# Patient Record
Sex: Male | Born: 1950 | Race: Black or African American | Hispanic: Refuse to answer | Marital: Married | Smoking: Former smoker
Health system: Southern US, Community
[De-identification: ages and names within clinical notes are randomized; demographics above are authoritative.]

## PROBLEM LIST (undated history)

## (undated) DIAGNOSIS — R7302 Impaired glucose tolerance (oral): Secondary | ICD-10-CM

## (undated) DIAGNOSIS — E785 Hyperlipidemia, unspecified: Secondary | ICD-10-CM

## (undated) DIAGNOSIS — I491 Atrial premature depolarization: Secondary | ICD-10-CM

## (undated) DIAGNOSIS — I10 Essential (primary) hypertension: Secondary | ICD-10-CM

## (undated) DIAGNOSIS — K649 Unspecified hemorrhoids: Secondary | ICD-10-CM

## (undated) DIAGNOSIS — F0390 Unspecified dementia without behavioral disturbance: Secondary | ICD-10-CM

## (undated) DIAGNOSIS — M1990 Unspecified osteoarthritis, unspecified site: Secondary | ICD-10-CM

## (undated) DIAGNOSIS — M109 Gout, unspecified: Secondary | ICD-10-CM

## (undated) HISTORY — DX: Essential (primary) hypertension: I10

## (undated) HISTORY — DX: Unspecified dementia, unspecified severity, without behavioral disturbance, psychotic disturbance, mood disturbance, and anxiety: F03.90

## (undated) HISTORY — PX: KNEE ARTHROSCOPY: SUR90

## (undated) HISTORY — DX: Atrial premature depolarization: I49.1

## (undated) HISTORY — DX: Hyperlipidemia, unspecified: E78.5

## (undated) HISTORY — DX: Impaired glucose tolerance (oral): R73.02

## (undated) HISTORY — DX: Gout, unspecified: M10.9

## (undated) HISTORY — DX: Unspecified hemorrhoids: K64.9

## (undated) HISTORY — DX: Unspecified osteoarthritis, unspecified site: M19.90

---

## 2003-08-28 ENCOUNTER — Ambulatory Visit (HOSPITAL_COMMUNITY): Admission: RE | Admit: 2003-08-28 | Discharge: 2003-08-28 | Payer: Self-pay | Admitting: Family Medicine

## 2003-08-29 ENCOUNTER — Ambulatory Visit (HOSPITAL_COMMUNITY): Admission: RE | Admit: 2003-08-29 | Discharge: 2003-08-29 | Payer: Self-pay | Admitting: Cardiology

## 2004-07-15 HISTORY — PX: COLONOSCOPY: SHX174

## 2004-08-09 ENCOUNTER — Ambulatory Visit (HOSPITAL_COMMUNITY): Admission: RE | Admit: 2004-08-09 | Discharge: 2004-08-09 | Payer: Self-pay | Admitting: Internal Medicine

## 2004-08-09 LAB — HM COLONOSCOPY

## 2005-01-29 ENCOUNTER — Ambulatory Visit: Payer: Self-pay | Admitting: Family Medicine

## 2005-03-05 ENCOUNTER — Ambulatory Visit: Payer: Self-pay | Admitting: Family Medicine

## 2005-04-09 ENCOUNTER — Ambulatory Visit: Payer: Self-pay | Admitting: Family Medicine

## 2005-06-03 ENCOUNTER — Ambulatory Visit: Payer: Self-pay | Admitting: Family Medicine

## 2005-06-11 ENCOUNTER — Ambulatory Visit: Payer: Self-pay | Admitting: *Deleted

## 2005-06-23 ENCOUNTER — Ambulatory Visit (HOSPITAL_COMMUNITY): Admission: RE | Admit: 2005-06-23 | Discharge: 2005-06-23 | Payer: Self-pay | Admitting: *Deleted

## 2005-06-25 ENCOUNTER — Ambulatory Visit: Payer: Self-pay | Admitting: *Deleted

## 2005-07-25 ENCOUNTER — Ambulatory Visit: Payer: Self-pay | Admitting: *Deleted

## 2005-08-07 ENCOUNTER — Ambulatory Visit: Payer: Self-pay | Admitting: *Deleted

## 2005-08-21 ENCOUNTER — Ambulatory Visit: Payer: Self-pay | Admitting: *Deleted

## 2005-10-13 ENCOUNTER — Ambulatory Visit: Payer: Self-pay | Admitting: Family Medicine

## 2005-10-14 ENCOUNTER — Ambulatory Visit (HOSPITAL_COMMUNITY): Admission: RE | Admit: 2005-10-14 | Discharge: 2005-10-14 | Payer: Self-pay | Admitting: Family Medicine

## 2005-10-29 ENCOUNTER — Ambulatory Visit: Payer: Self-pay | Admitting: Family Medicine

## 2005-12-22 ENCOUNTER — Ambulatory Visit: Payer: Self-pay | Admitting: Family Medicine

## 2006-04-28 ENCOUNTER — Ambulatory Visit: Payer: Self-pay | Admitting: Family Medicine

## 2006-05-01 ENCOUNTER — Ambulatory Visit: Payer: Self-pay | Admitting: *Deleted

## 2006-06-23 ENCOUNTER — Ambulatory Visit: Payer: Self-pay | Admitting: Family Medicine

## 2006-10-23 ENCOUNTER — Ambulatory Visit: Payer: Self-pay | Admitting: Family Medicine

## 2006-12-03 ENCOUNTER — Ambulatory Visit: Payer: Self-pay | Admitting: Family Medicine

## 2007-02-09 ENCOUNTER — Encounter: Payer: Self-pay | Admitting: Family Medicine

## 2007-02-09 LAB — CONVERTED CEMR LAB
ALT: 20 units/L (ref 0–53)
AST: 18 units/L (ref 0–37)
Albumin: 3.9 g/dL (ref 3.5–5.2)
Alkaline Phosphatase: 72 units/L (ref 39–117)
BUN: 23 mg/dL (ref 6–23)
Bilirubin, Direct: 0.1 mg/dL (ref 0.0–0.3)
CO2: 23 meq/L (ref 19–32)
Calcium: 9.1 mg/dL (ref 8.4–10.5)
Chloride: 106 meq/L (ref 96–112)
Cholesterol: 169 mg/dL (ref 0–200)
Creatinine, Ser: 1.16 mg/dL (ref 0.40–1.50)
Glucose, Bld: 87 mg/dL (ref 70–99)
HDL: 43 mg/dL (ref >39)
Indirect Bilirubin: 0.4 mg/dL (ref 0.0–0.9)
LDL Cholesterol: 112 mg/dL — ABNORMAL HIGH (ref 0–99)
Potassium: 4.5 meq/L (ref 3.5–5.3)
Sodium: 139 meq/L (ref 135–145)
Total Bilirubin: 0.5 mg/dL (ref 0.3–1.2)
Total CHOL/HDL Ratio: 3.9
Total Protein: 6.8 g/dL (ref 6.0–8.3)
Triglycerides: 72 mg/dL (ref <150)
VLDL: 14 mg/dL (ref 0–40)

## 2007-02-12 ENCOUNTER — Ambulatory Visit: Payer: Self-pay | Admitting: Family Medicine

## 2007-06-21 ENCOUNTER — Encounter: Payer: Self-pay | Admitting: Family Medicine

## 2007-06-21 LAB — CONVERTED CEMR LAB
ALT: 22 units/L (ref 0–53)
AST: 18 units/L (ref 0–37)
Albumin: 3.8 g/dL (ref 3.5–5.2)
Alkaline Phosphatase: 76 units/L (ref 39–117)
BUN: 15 mg/dL (ref 6–23)
Basophils Absolute: 0 10*3/uL (ref 0.0–0.1)
Basophils Relative: 1 % (ref 0–1)
Bilirubin, Direct: 0.1 mg/dL (ref 0.0–0.3)
Blood Glucose, Fasting: 101 mg/dL
CO2: 24 meq/L (ref 19–32)
Calcium: 9.2 mg/dL (ref 8.4–10.5)
Chloride: 106 meq/L (ref 96–112)
Cholesterol: 191 mg/dL (ref 0–200)
Creatinine, Ser: 1.08 mg/dL (ref 0.40–1.50)
Eosinophils Absolute: 0.1 10*3/uL (ref 0.0–0.7)
Eosinophils Relative: 3 % (ref 0–5)
Glucose, Bld: 101 mg/dL — ABNORMAL HIGH (ref 70–99)
HCT: 43 % (ref 39.0–52.0)
HDL: 53 mg/dL (ref >39)
Hemoglobin: 14.1 g/dL (ref 13.0–17.0)
Indirect Bilirubin: 0.2 mg/dL (ref 0.0–0.9)
LDL Cholesterol: 117 mg/dL — ABNORMAL HIGH (ref 0–99)
Lymphocytes Relative: 52 % — ABNORMAL HIGH (ref 12–46)
Lymphs Abs: 2.1 10*3/uL (ref 0.7–3.3)
MCHC: 32.8 g/dL (ref 30.0–36.0)
MCV: 88.8 fL (ref 78.0–100.0)
Monocytes Absolute: 0.4 10*3/uL (ref 0.2–0.7)
Monocytes Relative: 9 % (ref 3–11)
Neutro Abs: 1.4 10*3/uL — ABNORMAL LOW (ref 1.7–7.7)
Neutrophils Relative %: 35 % — ABNORMAL LOW (ref 43–77)
PSA: 0.59 ng/mL
PSA: 0.59 ng/mL (ref 0.10–4.00)
Platelets: 235 10*3/uL (ref 150–400)
Potassium: 4.7 meq/L (ref 3.5–5.3)
RBC count: 4.84 10*6/uL
RBC: 4.84 M/uL (ref 4.22–5.81)
RDW: 14.7 % — ABNORMAL HIGH (ref 11.5–14.0)
Sodium: 141 meq/L (ref 135–145)
Total Bilirubin: 0.3 mg/dL (ref 0.3–1.2)
Total CHOL/HDL Ratio: 3.6
Total Protein: 6.7 g/dL (ref 6.0–8.3)
Triglycerides: 104 mg/dL (ref <150)
Uric Acid, Serum: 7.5 mg/dL — ABNORMAL HIGH (ref 2.4–7.0)
VLDL: 21 mg/dL (ref 0–40)
WBC, blood: 4.1 10*3/uL
WBC: 4.1 10*3/uL (ref 4.0–10.5)

## 2007-06-25 ENCOUNTER — Ambulatory Visit: Payer: Self-pay | Admitting: Family Medicine

## 2007-10-15 ENCOUNTER — Encounter: Payer: Self-pay | Admitting: Family Medicine

## 2007-10-15 LAB — CONVERTED CEMR LAB
ALT: 21 units/L (ref 0–53)
AST: 18 units/L (ref 0–37)
Albumin: 3.7 g/dL (ref 3.5–5.2)
Alkaline Phosphatase: 73 units/L (ref 39–117)
Bilirubin, Direct: 0.1 mg/dL (ref 0.0–0.3)
Cholesterol: 172 mg/dL (ref 0–200)
HDL: 46 mg/dL (ref >39)
Indirect Bilirubin: 0.3 mg/dL (ref 0.0–0.9)
LDL Cholesterol: 109 mg/dL — ABNORMAL HIGH (ref 0–99)
Total Bilirubin: 0.4 mg/dL (ref 0.3–1.2)
Total CHOL/HDL Ratio: 3.7
Total Protein: 6.7 g/dL (ref 6.0–8.3)
Triglycerides: 83 mg/dL (ref <150)
Uric Acid, Serum: 8.1 mg/dL — ABNORMAL HIGH (ref 2.4–7.0)
VLDL: 17 mg/dL (ref 0–40)

## 2007-10-22 ENCOUNTER — Ambulatory Visit: Payer: Self-pay | Admitting: Family Medicine

## 2008-01-13 ENCOUNTER — Encounter: Payer: Self-pay | Admitting: Family Medicine

## 2008-03-27 ENCOUNTER — Encounter: Payer: Self-pay | Admitting: Family Medicine

## 2008-03-27 LAB — CONVERTED CEMR LAB
ALT: 29 units/L (ref 0–53)
AST: 29 units/L (ref 0–37)
Albumin: 4 g/dL (ref 3.5–5.2)
Alkaline Phosphatase: 83 units/L (ref 39–117)
BUN: 14 mg/dL (ref 6–23)
Bilirubin, Direct: 0.1 mg/dL (ref 0.0–0.3)
CO2: 25 meq/L (ref 19–32)
Calcium: 8.9 mg/dL (ref 8.4–10.5)
Chloride: 105 meq/L (ref 96–112)
Cholesterol: 191 mg/dL (ref 0–200)
Creatinine, Ser: 1.2 mg/dL (ref 0.40–1.50)
Glucose, Bld: 107 mg/dL — ABNORMAL HIGH (ref 70–99)
HDL: 43 mg/dL (ref >39)
Indirect Bilirubin: 0.3 mg/dL (ref 0.0–0.9)
LDL Cholesterol: 130 mg/dL — ABNORMAL HIGH (ref 0–99)
Potassium: 4.4 meq/L (ref 3.5–5.3)
Sodium: 142 meq/L (ref 135–145)
Total Bilirubin: 0.4 mg/dL (ref 0.3–1.2)
Total CHOL/HDL Ratio: 4.4
Total Protein: 7.1 g/dL (ref 6.0–8.3)
Triglycerides: 92 mg/dL (ref <150)
Uric Acid, Serum: 8.6 mg/dL — ABNORMAL HIGH (ref 4.0–7.8)
VLDL: 18 mg/dL (ref 0–40)

## 2008-03-31 ENCOUNTER — Ambulatory Visit: Payer: Self-pay | Admitting: Family Medicine

## 2008-05-02 ENCOUNTER — Ambulatory Visit: Payer: Self-pay | Admitting: Cardiology

## 2008-06-06 ENCOUNTER — Ambulatory Visit: Payer: Self-pay | Admitting: Cardiology

## 2008-06-28 ENCOUNTER — Encounter: Payer: Self-pay | Admitting: Family Medicine

## 2008-06-28 LAB — CONVERTED CEMR LAB
ALT: 31 units/L (ref 0–53)
AST: 24 units/L (ref 0–37)
Albumin: 3.8 g/dL (ref 3.5–5.2)
Alkaline Phosphatase: 69 units/L (ref 39–117)
BUN: 15 mg/dL (ref 6–23)
Bilirubin, Direct: 0.1 mg/dL (ref 0.0–0.3)
CO2: 23 meq/L (ref 19–32)
Calcium: 9.3 mg/dL (ref 8.4–10.5)
Chloride: 102 meq/L (ref 96–112)
Cholesterol: 198 mg/dL (ref 0–200)
Creatinine, Ser: 1.22 mg/dL (ref 0.40–1.50)
Glucose, Bld: 105 mg/dL — ABNORMAL HIGH (ref 70–99)
HDL: 55 mg/dL (ref >39)
Indirect Bilirubin: 0.4 mg/dL (ref 0.0–0.9)
LDL Cholesterol: 127 mg/dL — ABNORMAL HIGH (ref 0–99)
Potassium: 4.3 meq/L (ref 3.5–5.3)
Sodium: 139 meq/L (ref 135–145)
Total Bilirubin: 0.5 mg/dL (ref 0.3–1.2)
Total CHOL/HDL Ratio: 3.6
Total Protein: 7 g/dL (ref 6.0–8.3)
Triglycerides: 80 mg/dL (ref <150)
VLDL: 16 mg/dL (ref 0–40)

## 2008-06-30 ENCOUNTER — Ambulatory Visit: Payer: Self-pay | Admitting: Family Medicine

## 2008-06-30 LAB — CONVERTED CEMR LAB: PSA: 0.68 ng/mL (ref 0.10–4.00)

## 2008-07-07 ENCOUNTER — Encounter: Payer: Self-pay | Admitting: Family Medicine

## 2008-07-07 LAB — CONVERTED CEMR LAB
Glucose, 2 hour: 138 mg/dL (ref 70–139)
Glucose, Fasting: 109 mg/dL — ABNORMAL HIGH (ref 70–99)

## 2008-08-02 ENCOUNTER — Encounter: Payer: Self-pay | Admitting: Family Medicine

## 2008-09-01 ENCOUNTER — Encounter: Payer: Self-pay | Admitting: Family Medicine

## 2008-09-01 ENCOUNTER — Ambulatory Visit: Payer: Self-pay | Admitting: Cardiology

## 2008-11-03 ENCOUNTER — Ambulatory Visit: Payer: Self-pay | Admitting: Family Medicine

## 2008-11-03 DIAGNOSIS — R5381 Other malaise: Secondary | ICD-10-CM | POA: Insufficient documentation

## 2008-11-03 DIAGNOSIS — R5383 Other fatigue: Secondary | ICD-10-CM

## 2008-11-03 LAB — CONVERTED CEMR LAB: Hgb A1c MFr Bld: 6.2 %

## 2009-01-18 ENCOUNTER — Telehealth: Payer: Self-pay | Admitting: Family Medicine

## 2009-01-26 ENCOUNTER — Telehealth: Payer: Self-pay | Admitting: Family Medicine

## 2009-01-26 ENCOUNTER — Encounter: Payer: Self-pay | Admitting: Family Medicine

## 2009-06-12 ENCOUNTER — Encounter (INDEPENDENT_AMBULATORY_CARE_PROVIDER_SITE_OTHER): Payer: Self-pay | Admitting: *Deleted

## 2009-12-27 ENCOUNTER — Encounter: Payer: Self-pay | Admitting: Cardiology

## 2009-12-27 ENCOUNTER — Ambulatory Visit: Payer: Self-pay | Admitting: Family Medicine

## 2009-12-28 DIAGNOSIS — M25519 Pain in unspecified shoulder: Secondary | ICD-10-CM | POA: Insufficient documentation

## 2009-12-29 ENCOUNTER — Encounter: Payer: Self-pay | Admitting: Family Medicine

## 2009-12-31 ENCOUNTER — Encounter: Payer: Self-pay | Admitting: Family Medicine

## 2009-12-31 LAB — CONVERTED CEMR LAB
ALT: 24 units/L (ref 0–53)
AST: 19 units/L (ref 0–37)
Albumin: 3.9 g/dL (ref 3.5–5.2)
Alkaline Phosphatase: 71 units/L (ref 39–117)
BUN: 17 mg/dL (ref 6–23)
Basophils Absolute: 0 10*3/uL (ref 0.0–0.1)
Basophils Relative: 1 % (ref 0–1)
Bilirubin, Direct: 0.1 mg/dL (ref 0.0–0.3)
CO2: 26 meq/L (ref 19–32)
Calcium: 9.6 mg/dL (ref 8.4–10.5)
Chloride: 106 meq/L (ref 96–112)
Cholesterol: 193 mg/dL (ref 0–200)
Creatinine, Ser: 1.22 mg/dL (ref 0.40–1.50)
Eosinophils Absolute: 0.1 10*3/uL (ref 0.0–0.7)
Eosinophils Relative: 3 % (ref 0–5)
Glucose, Bld: 106 mg/dL — ABNORMAL HIGH (ref 70–99)
HCT: 43.4 % (ref 39.0–52.0)
HDL: 46 mg/dL (ref >39)
Hemoglobin: 14.4 g/dL (ref 13.0–17.0)
Indirect Bilirubin: 0.5 mg/dL (ref 0.0–0.9)
LDL Cholesterol: 132 mg/dL — ABNORMAL HIGH (ref 0–99)
Lymphocytes Relative: 50 % — ABNORMAL HIGH (ref 12–46)
Lymphs Abs: 1.8 10*3/uL (ref 0.7–4.0)
MCHC: 33.2 g/dL (ref 30.0–36.0)
MCV: 87.1 fL (ref 78.0–100.0)
Monocytes Absolute: 0.3 10*3/uL (ref 0.1–1.0)
Monocytes Relative: 9 % (ref 3–12)
Neutro Abs: 1.3 10*3/uL — ABNORMAL LOW (ref 1.7–7.7)
Neutrophils Relative %: 37 % — ABNORMAL LOW (ref 43–77)
PSA: 0.58 ng/mL (ref 0.10–4.00)
Platelets: 231 10*3/uL (ref 150–400)
Potassium: 4.3 meq/L (ref 3.5–5.3)
RBC: 4.98 M/uL (ref 4.22–5.81)
RDW: 13.9 % (ref 11.5–15.5)
Sodium: 138 meq/L (ref 135–145)
TSH: 0.711 microintl units/mL (ref 0.350–4.500)
Total Bilirubin: 0.6 mg/dL (ref 0.3–1.2)
Total CHOL/HDL Ratio: 4.2
Total Protein: 6.7 g/dL (ref 6.0–8.3)
Triglycerides: 73 mg/dL (ref <150)
Uric Acid, Serum: 9 mg/dL — ABNORMAL HIGH (ref 4.0–7.8)
VLDL: 15 mg/dL (ref 0–40)
WBC: 3.6 10*3/uL — ABNORMAL LOW (ref 4.0–10.5)

## 2010-01-02 ENCOUNTER — Encounter: Payer: Self-pay | Admitting: Family Medicine

## 2010-02-01 ENCOUNTER — Telehealth: Payer: Self-pay | Admitting: Family Medicine

## 2010-02-07 ENCOUNTER — Ambulatory Visit: Payer: Self-pay | Admitting: Family Medicine

## 2010-02-07 DIAGNOSIS — R7302 Impaired glucose tolerance (oral): Secondary | ICD-10-CM | POA: Insufficient documentation

## 2010-02-12 ENCOUNTER — Telehealth: Payer: Self-pay | Admitting: Family Medicine

## 2010-02-13 ENCOUNTER — Telehealth: Payer: Self-pay | Admitting: Family Medicine

## 2010-05-15 ENCOUNTER — Telehealth: Payer: Self-pay | Admitting: Family Medicine

## 2010-05-25 ENCOUNTER — Encounter: Payer: Self-pay | Admitting: Family Medicine

## 2010-05-27 LAB — CONVERTED CEMR LAB
ALT: 31 units/L (ref 0–53)
AST: 23 units/L (ref 0–37)
Albumin: 3.9 g/dL (ref 3.5–5.2)
Alkaline Phosphatase: 68 units/L (ref 39–117)
BUN: 20 mg/dL (ref 6–23)
Bilirubin, Direct: 0.1 mg/dL (ref 0.0–0.3)
CO2: 26 meq/L (ref 19–32)
Calcium: 9 mg/dL (ref 8.4–10.5)
Chloride: 106 meq/L (ref 96–112)
Cholesterol: 144 mg/dL (ref 0–200)
Creatinine, Ser: 1.02 mg/dL (ref 0.40–1.50)
Glucose, Bld: 95 mg/dL (ref 70–99)
HDL: 46 mg/dL (ref >39)
Hgb A1c MFr Bld: 6.1 % — ABNORMAL HIGH (ref <5.7)
Indirect Bilirubin: 0.4 mg/dL (ref 0.0–0.9)
LDL Cholesterol: 85 mg/dL (ref 0–99)
Potassium: 4.8 meq/L (ref 3.5–5.3)
Sodium: 139 meq/L (ref 135–145)
Total Bilirubin: 0.5 mg/dL (ref 0.3–1.2)
Total CHOL/HDL Ratio: 3.1
Total Protein: 6.6 g/dL (ref 6.0–8.3)
Triglycerides: 63 mg/dL (ref <150)
Uric Acid, Serum: 6.4 mg/dL (ref 4.0–7.8)
VLDL: 13 mg/dL (ref 0–40)

## 2010-05-29 ENCOUNTER — Ambulatory Visit: Payer: Self-pay | Admitting: Family Medicine

## 2010-05-29 DIAGNOSIS — L259 Unspecified contact dermatitis, unspecified cause: Secondary | ICD-10-CM | POA: Insufficient documentation

## 2010-09-30 ENCOUNTER — Ambulatory Visit: Payer: Self-pay | Admitting: Family Medicine

## 2010-09-30 DIAGNOSIS — K6289 Other specified diseases of anus and rectum: Secondary | ICD-10-CM | POA: Insufficient documentation

## 2010-09-30 LAB — CONVERTED CEMR LAB
ALT: 24 units/L (ref 0–53)
AST: 20 units/L (ref 0–37)
Albumin: 3.9 g/dL (ref 3.5–5.2)
Alkaline Phosphatase: 66 units/L (ref 39–117)
BUN: 14 mg/dL (ref 6–23)
Bilirubin, Direct: 0.1 mg/dL (ref 0.0–0.3)
CO2: 25 meq/L (ref 19–32)
Calcium: 9.1 mg/dL (ref 8.4–10.5)
Chloride: 105 meq/L (ref 96–112)
Cholesterol: 180 mg/dL (ref 0–200)
Creatinine, Ser: 1.04 mg/dL (ref 0.40–1.50)
Glucose, Bld: 107 mg/dL — ABNORMAL HIGH (ref 70–99)
HDL: 59 mg/dL (ref >39)
Hgb A1c MFr Bld: 6.1 % — ABNORMAL HIGH (ref <5.7)
Indirect Bilirubin: 0.3 mg/dL (ref 0.0–0.9)
LDL Cholesterol: 108 mg/dL — ABNORMAL HIGH (ref 0–99)
Potassium: 4.4 meq/L (ref 3.5–5.3)
Sodium: 139 meq/L (ref 135–145)
Total Bilirubin: 0.4 mg/dL (ref 0.3–1.2)
Total CHOL/HDL Ratio: 3.1
Total Protein: 6.5 g/dL (ref 6.0–8.3)
Triglycerides: 64 mg/dL (ref <150)
VLDL: 13 mg/dL (ref 0–40)

## 2010-10-01 ENCOUNTER — Telehealth: Payer: Self-pay | Admitting: Family Medicine

## 2010-10-01 ENCOUNTER — Encounter: Payer: Self-pay | Admitting: Family Medicine

## 2010-10-01 LAB — CONVERTED CEMR LAB: Uric Acid, Serum: 7.2 mg/dL (ref 4.0–7.8)

## 2010-10-02 ENCOUNTER — Encounter: Payer: Self-pay | Admitting: Family Medicine

## 2011-01-12 LAB — CONVERTED CEMR LAB: Hgb A1c MFr Bld: 6.4 % — ABNORMAL HIGH (ref 4.6–6.1)

## 2011-01-14 NOTE — Progress Notes (Signed)
  Phone Note Call from Patient   Summary of Call: Marvin [name], said that he wasn't having a flare but the last time he was in you told him to take the allopurinol everyday to avoid flares. he didn't get an rx at the time because he still had some. I looked back at the last OV though and it looks like both the allopurinol and indomethicin were both removed from med list because of improvement. What do you want me to do? Initial call taken by: Everitt Amber,  January 26, 2009 8:49 AM  Follow-up for Phone Call        allopurinol 300mg  1  daily is being [facility] to [facility], pt is aware. Follow-up by: Syliva Overman MD,  January 26, 2009 1:07 PM    New/Updated Medications: ALLOPURINOL 300 MG TABS (ALLOPURINOL) Take 1 tablet by mouth once a day   Prescriptions: ALLOPURINOL 300 MG TABS (ALLOPURINOL) Take 1 tablet by mouth once a day  #30 x 3   Entered and Authorized by:   Syliva Overman MD   Signed by:   Syliva Overman MD on 01/26/2009   Method used:   Electronically to        [facility]  [address] (retail)       [address]       [place], [place]  [address]       Ph: [phone]       Fax: [phone]   RxID:   [id number]

## 2011-01-14 NOTE — Miscellaneous (Signed)
Summary: refill  Clinical Lists Changes  Medications: Added new medication of CHLORTHALIDONE 25 MG TABS (CHLORTHALIDONE) take 1/2 tablet daily - Signed Rx of CHLORTHALIDONE 25 MG TABS (CHLORTHALIDONE) take 1/2 tablet daily;  #30 x 11;  Signed;  Entered by: Dreama Saa, CNA;  Authorized by: Kathlen Brunswick, MD, [facility];  Method used: Electronically to [facility] [address], [place], [place], [place]  [address], Ph: [phone], Fax: [phone]    Prescriptions: CHLORTHALIDONE 25 MG TABS (CHLORTHALIDONE) take 1/2 tablet daily  #30 x 11   Entered by:   Dreama Saa, CNA   Authorized by:   Kathlen Brunswick, MD, [facility]   Signed by:   Dreama Saa, CNA on 06/12/2009   Method used:   Electronically to        [facility]  [address] (retail)       [address]       [place], [place]  [address]       Ph: [phone]       Fax: [phone]   RxID:   [id number]

## 2011-01-14 NOTE — Consult Note (Signed)
Summary: KEELING  /RIGHT HAND  KEELING  /RIGHT HAND   Imported By: Lind Guest 08/08/2008 10:08:19  _____________________________________________________________________  External Attachment:    Type:   Image     Comment:   External Document

## 2011-01-14 NOTE — Assessment & Plan Note (Signed)
Summary: PHY   Vital Signs:  Patient profile:   60 year old male Height:      70 inches Weight:      216.25 pounds BMI:     31.14 O2 Sat:      98 % Pulse rate:   112 / minute Pulse rhythm:   irregularly irregular Resp:     16 per minute BP sitting:   140 / 80  (left arm) Cuff size:   large  Vitals Entered By: Everitt Amber LPN (February 07, 2010 4:16 PM)  Nutrition Counseling: Patient's BMI is greater than 25 and therefore counseled on weight management options.  Vision Screening:Left eye w/o correction: 20 / 40 Right Eye w/o correction: 20 / 20 Both eyes w/o correction:  20/ 20  Color vision testing: normal      Vision Entered By: Everitt Amber LPN (February 07, 2010 4:17 PM)   Primary Care Provider:  Syliva Overman MD   History of Present Illness: Reports  that he has been doing well. Denies recent fever or chills. Denies sinus pressure, nasal congestion , ear pain or sore throat. Denies chest congestion, or cough productive of sputum. Denies chest pain, PND, orthopnea or leg swelling.he isaware of the irregular heartbeat, which is not new , and hopesto see cardiology in the future, at this time his job situation does not allow. Denies abdominal pain, nausea, vomitting, diarrhea or constipation. Denies change in bowel movements or bloody stool. Denies dysuria , frequency, incontinence or hesitancy. reports rightshoulder pain with reduced mobility radiating down upper arm with a tender spot posteriorly. Denies headaches, vertigo, seizures. Denies depression, anxiety or insomnia. Denies  rash, lesions, or itch.     Current Medications (verified): 1)  Ibuprofen 800 Mg Tabs (Ibuprofen) .... One Tab By Mouth Tid 2)  Lipitor 40 Mg Tabs (Atorvastatin Calcium) .... One Tab By Mouth Qhs 3)  Amlodipine Besylate 10 Mg Tabs (Amlodipine Besylate) .... One Tab By Mouth Qd 4)  Allopurinol 300 Mg Tabs (Allopurinol) .... Take 1 Tablet By Mouth Once A Day  Allergies  (verified): 1)  ! Pcn 2)  ! Asa  Review of Systems      See HPI Eyes:  Complains of vision loss-1 eye; plans to make eye appt. MS:  See HPI. Endo:  Denies cold intolerance, excessive hunger, excessive thirst, excessive urination, heat intolerance, polyuria, and weight change. Heme:  Denies abnormal bruising and bleeding. Allergy:  Complains of seasonal allergies; denies hives or rash and sneezing.  Physical Exam  General:  Well-developed,well-nourished,in no acute distress; alert,appropriate and cooperative throughout examination Head:  Normocephalic and atraumatic without obvious abnormalities. No apparent alopecia or balding. Eyes:  No corneal or conjunctival inflammation noted. EOMI. Perrla. Funduscopic exam benign, without hemorrhages, exudates or papilledema. Vision grossly normal. Ears:  External ear exam shows no significant lesions or deformities.  Otoscopic examination reveals clear canals, tympanic membranes are intact bilaterally without bulging, retraction, inflammation or discharge. Hearing is grossly normal bilaterally. Nose:  External nasal examination shows no deformity or inflammation. Nasal mucosa are pink and moist without lesions or exudates. Mouth:  Oral mucosa and oropharynx without lesions or exudates.  Teeth in good repair. Neck:  No deformities, masses, or tenderness noted. Chest Wall:  No deformities, masses, tenderness or gynecomastia noted. Breasts:  No masses or gynecomastia noted Lungs:  Normal respiratory effort, chest expands symmetrically. Lungs are clear to auscultation, no crackles or wheezes. Heart:  Normal rate and regular rhythm. S1 and S2 normal without gallop,  murmur, click, rub or other extra sounds. Abdomen:  Bowel sounds positive,abdomen soft and non-tender without masses, organomegaly or hernias noted. Rectal:  No external abnormalities noted. Normal sphincter tone. No rectal masses or tenderness.guaic negative stool Genitalia:  Testes  bilaterally descended without nodularity, tenderness or masses. No scrotal masses or lesions. No penis lesions or urethral discharge. Prostate:  Prostate gland firm and smooth, no enlargement, nodularity, tenderness, mass, asymmetry or induration. Msk:  No deformity or scoliosis noted of thoracic or lumbar spine.   Pulses:  R and L carotid,radial,femoral,dorsalis pedis and posterior tibial pulses are full and equal bilaterally Extremities:  No clubbing, cyanosis, edema, or deformity noted with normal full range of motion of all joints. posterior right shoulder tender with slight limitation of movement of the joint Neurologic:  No cranial nerve deficits noted. Station and gait are normal. Plantar reflexes are down-going bilaterally. DTRs are symmetrical throughout. Sensory, motor and coordinative functions appear intact. Skin:  Intact without suspicious lesions or rashes Cervical Nodes:  No lymphadenopathy noted Axillary Nodes:  No palpable lymphadenopathy Inguinal Nodes:  No significant adenopathy Psych:  Cognition and judgment appear intact. Alert and cooperative with normal attention span and concentration. No apparent delusions, illusions, hallucinations   Impression & Recommendations:  Problem # 1:  IMPAIRED FASTING GLUCOSE (ICD-790.21) Assessment Comment Only  Orders: T- Hemoglobin A1C ([id number])6.4. Discussed with thept the imptance of dietary changeand exercise and weight loss to prevent the developmentofdiabetes  Problem # 2:  SHOULDER PAIN, RIGHT (ICD-719.41) Assessment: Deteriorated  His updated medication list for this problem includes:    Ibuprofen 800 Mg Tabs (Ibuprofen) ..... One tab by mouth tid    Vicodin Es 7.5-750 Mg Tabs (Hydrocodone-acetaminophen) .Marland Kitchen... Take 1 tab by mouth at bedtime as needed, pt staying up at nioght because of pain  Orders: Orthopedic Referral (Ortho)  Problem # 3:  OBESITY, UNSPECIFIED (ICD-278.00) Assessment: Improved  Ht: 70 (02/07/2010)    Wt: 216.25 (02/07/2010)   BMI: 31.14 (02/07/2010)  Problem # 4:  ESSENTIAL HYPERTENSION, BENIGN (ICD-401.1) Assessment: Improved  Reviewed preventive care protocols, scheduled due services, and updated immunizations.  His updated medication list for this problem includes:    Amlodipine Besylate 10 Mg Tabs (Amlodipine besylate) ..... One tab by mouth qd  BP today: 140/80 Prior BP: 160/100 (12/27/2009)  Labs Reviewed: K+: 4.3 (12/29/2009) Creat: : 1.22 (12/29/2009)   Chol: 193 (12/29/2009)   HDL: 46 (12/29/2009)   LDL: 132 (12/29/2009)   TG: 73 (12/29/2009)  Problem # 5:  HYPERLIPIDEMIA (ICD-272.4) Assessment: Comment Only  His updated medication list for this problem includes:    Lipitor 40 Mg Tabs (Atorvastatin calcium) ..... One tab by mouth qhs  Orders: T-Hepatic Function [id number]) T-Lipid Profile [id number])  Labs Reviewed: SGOT: 19 (12/29/2009)   SGPT: 24 (12/29/2009)   HDL:46 (12/29/2009), 55 (06/28/2008)  LDL:132 (12/29/2009), 127 (06/28/2008)  Chol:193 (12/29/2009), 198 (06/28/2008)  Trig:73 (12/29/2009), 80 (06/28/2008)  Complete Medication List: 1)  Ibuprofen 800 Mg Tabs (Ibuprofen) .... One tab by mouth tid 2)  Lipitor 40 Mg Tabs (Atorvastatin calcium) .... One tab by mouth qhs 3)  Amlodipine Besylate 10 Mg Tabs (Amlodipine besylate) .... One tab by mouth qd 4)  Allopurinol 300 Mg Tabs (Allopurinol) .... Take 1 tablet by mouth once a day 5)  Vicodin Es 7.5-750 Mg Tabs (Hydrocodone-acetaminophen) .... Take 1 tab by mouth at bedtime as needed  Other Orders: T-Basic Metabolic Panel [id number]) T-Uric Acid (Blood) [phone])  Patient Instructions: 1)  F/U in may 2)  BMP  prior to visit, ICD-9: 3)  Hepatic Panel prior to visit, ICD-9: 4)  Lipid Panel prior to visit, ICD-9:  fasting in may 5)  HbgA1C prior to visit, ICD-9: 6)  uric acid level  7)  It is important that you exercise regularly at least 20 minutes 5 times a week. If you develop chest pain,  have severe difficulty breathing, or feel very tired , stop exercising immediately and seek medical attention. 8)  You need to lose weight. Consider a lower calorie diet and regular exercise.  9)  new med for pain and a referral to Dr Hilda Lias 10)  formal eye exam recommended. Prescriptions: VICODIN ES 7.5-750 MG TABS (HYDROCODONE-ACETAMINOPHEN) Take 1 tab by mouth at bedtime as needed  #30 x 1   Entered and Authorized by:   Syliva Overman MD   Signed by:   Syliva Overman MD on 02/07/2010   Method used:   Printed then faxed to ...       [facility]  [address] (retail)       [address]       [place], [place]  [address]       Ph: [phone]       Fax: [phone]   RxID:   [id number]   Appended Document: PHY  Laboratory Results  Date/Time Received: Feb 07 2010 Date/Time Reported: Feb 07 2010  Stool - Occult Blood Hemmoccult #1: negative Date: 02/07/2010 Comments: [id number] 9r 8/11 118 10/12

## 2011-01-14 NOTE — Assessment & Plan Note (Signed)
Summary: office visit   Vital Signs:  Patient profile:   60 year old male Height:      70 inches Weight:      215 pounds BMI:     30.96 O2 Sat:      100 % on Room air Pulse rate:   68 / minute Pulse rhythm:   regular Resp:     16 per minute BP sitting:   132 / 82  (left arm)  Vitals Entered By: Mauricia Area CMA (September 30, 2010 9:39 AM)  Nutrition Counseling: Patient's BMI is greater than 25 and therefore counseled on weight management options.  O2 Flow:  Room air CC: follow up   Primary Care Provider:  Syliva Overman MD  CC:  follow up.  History of Present Illness: pt still is c/o painful anal lesion, even worrying that it may be cancer, no response to doxycycline,bending makes it more painful, he has had bleeding from this , defecation is not a probem. intermittent low back pain. Reports  thathe has otherwise been doing well. Denies recent fever or chills. Denies sinus pressure, nasal congestion , ear pain or sore throat. Denies chest congestion, or cough productive of sputum. Denies chest pain, palpitations, PND, orthopnea or leg swelling. Denies abdominal pain, nausea, vomitting, diarrhea or constipation. Denies change in bowel movements or bloody stool. Denies dysuria , frequency, incontinence or hesitancy. c/o intermittent  joint pain, and  reduced mobility. Denies headaches, vertigo, seizures. Denies depression, anxiety or insomnia. pt's commitment to exercise is reduced, his weight is unchanged since his last visit     Current Medications (verified): 1)  Lipitor 40 Mg Tabs (Atorvastatin Calcium) .... 1/2 Tab By Mouth At Bedtime. 2)  Amlodipine Besylate 10 Mg Tabs (Amlodipine Besylate) .... One Tab By Mouth Once Daily. 3)  Vicodin Es 7.5-750 Mg Tabs (Hydrocodone-Acetaminophen) .... Take 1 Tab By Mouth At Bedtime As Needed 4)  Diclofenac Sodium 75 Mg Tbec (Diclofenac Sodium) .... Take 1 Tablet By Mouth Two Times A Day  Allergies (verified): 1)  ! Pcn 2)   ! Asa  Review of Systems MS:  Complains of joint pain and stiffness; right shoulder pain intermittently.  Physical Exam  General:  Well-developed,well-nourished,in no acute distress; alert,appropriate and cooperative throughout examination HEENT: No facial asymmetry,  EOMI, No sinus tenderness, TM's Clear, oropharynx  pink and moist.   Chest: Clear to auscultation bilaterally.  CVS: S1, S2, No murmurs, No S3.   Abd: Soft, Nontender.  MS: Adequate ROM spine, hips, shoulders and knees.  Ext: No edema.   CNS: CN 2-12 intact, power tone and sensation normal throughout.   Skin: Intact, no visible lesions or rashes.  Psych: Good eye contact, normal affect.  Memory intact, not anxious or depressed appearing.    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 Take 1 tab by mouth at bedtime as needed    Diclofenac Sodium 75 Mg Tbec (Diclofenac sodium) .Marland Kitchen... Take 1 tablet by mouth two times a day has been evaluated by ortho   Problem # 4:  OBESITY, UNSPECIFIED (ICD-278.00) Assessment: Unchanged  Ht: 70 (09/30/2010)   Wt:  215 (09/30/2010)   BMI: 30.96 (09/30/2010) therapeutic lifestyle change discussed and encouraged  Problem # 5:  HYPERLIPIDEMIA (ICD-272.4) Assessment: Deteriorated  The following medications were removed from the medication list:    Lipitor 40 Mg Tabs (Atorvastatin calcium) ..... One tab by mouth qhs His updated medication list for this problem includes:    Lovastatin 20 Mg Tabs (Lovastatin) .Marland Kitchen... Take 1 tab by mouth at bedtime Low fat  dietdiscussed and encouraged  Orders: T-Lipid Profile [id number] [id number]) T-Hepatic Function [phone])  Labs Reviewed: SGOT: 20 (09/28/2010)   SGPT: 24 (09/28/2010)   HDL:59 (09/28/2010), 46 (05/25/2010)  LDL:108 (09/28/2010), 85 (46/96/2952)  Chol:180 (09/28/2010), 144 (05/25/2010)  Trig:64 (09/28/2010), 63 (05/25/2010)  Problem # 6:  GOUT WITH OTHER SPECIFIED MANIFESTATIONS (ICD-274.89) Assessment: Improved  Problem # 7:  ESSENTIAL HYPERTENSION, BENIGN (ICD-401.1) Assessment: Unchanged  His updated medication list for this problem includes:    Amlodipine Besylate 10 Mg Tabs (Amlodipine besylate) ..... One tab by mouth once daily.  Orders: T-Basic Metabolic Panel ([id number])  BP today: 132/82 Prior BP: 130/72 (05/29/2010)  Labs Reviewed: K+: 4.4 (09/28/2010) Creat: : 1.04 (09/28/2010)   Chol: 180 (09/28/2010)   HDL: 59 (09/28/2010)   LDL: 108 (09/28/2010)   TG: 64 (09/28/2010)  Complete Medication List: 1)  Amlodipine Besylate 10 Mg Tabs (Amlodipine besylate) .... One tab by mouth once daily. 2)  Vicodin Es 7.5-750 Mg Tabs (Hydrocodone-acetaminophen) .... Take 1 tab by mouth at bedtime as needed 3)  Diclofenac Sodium 75 Mg Tbec (Diclofenac sodium) .... Take 1 tablet by mouth two times a day 4)  Lovastatin 20 Mg Tabs (Lovastatin) .... Take 1 tab by mouth at bedtime  Other Orders: T-CBC w/Diff ([id number]) T-PSA ([id number]) T- Hemoglobin A1C (87564-33295) T-Vitamin D (25-Hydroxy) (18841-66063)  Patient Instructions: 1)  CPE in 4.5 to 5 months. 2)  It is important that you exercise regularly at least 30 minutes 5 times a week. If you develop chest pain, have severe difficulty breathing, or feel very tired , stop exercising immediately and seek medical attention. 3)  You need to lose weight. Consider a lower calorie diet and regular exercise. Goal is 10 pounds 4)  pls stop the lipitor, due to aches  5)  , and start lovastatin 20mg  one at night, you also need to folllow a  low fat and low carb diet 6)  you are being referred to a surgeon asap Prescriptions: LOVASTATIN 20 MG TABS (LOVASTATIN) Take 1 tab by mouth at bedtime  #90 x 3   Entered and Authorized by:   Syliva Overman MD   Signed by:   Syliva Overman MD on 09/30/2010   Method used:   Printed then faxed to ...       [address] [phone]* (retail)       [address]       [place], [place]  [address]       Ph: [phone] or [phone]       Fax: [phone]   RxID:   [id number]    Orders Added: 1)  Est. Patient Level IV [99214] 2)  Est. Patient Level IV [99214] 3)  T-CBC w/Diff [[id number]] 4)  T-PSA [29937-16967] 5)  T-Basic Metabolic Panel [[id number]] 6)  T-Lipid Profile [80061-22930] 7)  T-Hepatic Function [80076-22960] 8)  T- Hemoglobin A1C [83036-23375] 9)  T-Vitamin D (25-Hydroxy) [89381-01751] 10)  Surgical Referral [Surgery]

## 2011-01-14 NOTE — Progress Notes (Signed)
Summary: refill  Phone Note Call from Patient   Summary of Call: needs a refill on lipitor [facility]  [phone] Initial call taken by: Rudene Anda,  May 15, 2010 2:41 PM  Follow-up for Phone Call        Rx Called In Follow-up by: Adella Hare LPN,  May 16, 3663 2:51 PM    Prescriptions: LIPITOR 40 MG TABS (ATORVASTATIN CALCIUM) one tab by mouth qhs  #90 x 0   Entered by:   Adella Hare LPN   Authorized by:   Syliva Overman MD   Signed by:   Adella Hare LPN on 40/34/7425   Method used:   Electronically to        [facility] [phone]* (retail)       [address]       [place], [place]  [address]       Ph: [phone] or [phone]       Fax: [phone]   RxID:   [id number]

## 2011-01-14 NOTE — Miscellaneous (Signed)
Summary: Refill  Clinical Lists Changes  Medications: Added new medication of INDOMETHACIN 25 MG CAPS (INDOMETHACIN) Take one tab three times a day, then one tab two times a day, then one tab once daily for 3 days - Signed Rx of INDOMETHACIN 25 MG CAPS (INDOMETHACIN) Take one tab three times a day, then one tab two times a day, then one tab once daily for 3 days;  #15 x 0;  Signed;  Entered by: Everitt Amber;  Authorized by: Syliva Overman MD;  Method used: Electronically to [facility] [address] [place], [place], [place]  [address], Ph: [phone], Fax: [phone]    Prescriptions: INDOMETHACIN 25 MG CAPS (INDOMETHACIN) Take one tab three times a day, then one tab two times a day, then one tab once daily for 3 days  #15 x 0   Entered by:   Everitt Amber   Authorized by:   Syliva Overman MD   Signed by:   Everitt Amber on 01/26/2009   Method used:   Electronically to        [facility]  [address] (retail)       [address]       [place], [place]  [address]       Ph: [phone]       Fax: [phone]   RxID:   [id number]

## 2011-01-14 NOTE — Progress Notes (Signed)
Summary: LAB ORDERS  Phone Note Call from Patient   Summary of Call: FAX  OVER LAB ORDERS HE IS GOING IN THE MORNING Initial call taken by: Lind Guest,  February 01, 2010 9:32 AM  Follow-up for Phone Call        left detailed voice message advising no labs due until april Follow-up by: Adella Hare LPN,  February 01, 2010 10:01 AM

## 2011-01-14 NOTE — Progress Notes (Signed)
Summary: returned call  Phone Note Call from Patient   Summary of Call: returned  your call call him back  Initial call taken by: Lind Guest,  October 01, 2010 4:24 PM  Follow-up for Phone Call        patient aware Follow-up by: Mauricia Area CMA,  October 01, 2010 4:35 PM

## 2011-01-14 NOTE — Assessment & Plan Note (Signed)
Summary: office visit   Vital Signs:  Patient profile:   60 year old male Height:      70 inches Weight:      216.25 pounds BMI:     31.14 Pulse rate:   103 / minute Pulse rhythm:   regular Resp:     16 per minute BP sitting:   130 / 72  (right arm)  Vitals Entered By: Everitt Amber LPN (May 29, 2010 4:46 PM)  Nutrition Counseling: Patient's BMI is greater than 25 and therefore counseled on weight management options. CC: Follow up chronic problems   Primary Care Provider:  Syliva Overman MD  CC:  Follow up chronic problems.  History of Present Illness: 3 week h/o painful boil  in perianal area,flared uo in the last 3 weeks, has used topical with no relief, no fever or chills.  Was treated by Dr Hilda Lias for right shoulder pain , said rotator cuff good results with intrarticular injection, will call back if he needs to. Reports  that the has otherwise been doing well. Denies recent fever or chills. Denies sinus pressure, nasal congestion , ear pain or sore throat. Denies chest congestion, or cough productive of sputum. Denies chest pain,  PND, orthopnea or leg swelling. Denies abdominal pain, nausea, vomitting, diarrhea or constipation. .Denies change in bowel movements or bloody stool. Denies dysuria , frequency, incontinence or hesitancy.  Denies headaches, vertigo, seizures. Denies depression, anxiety or insomnia.    Current Medications (verified): 1)  Lipitor 40 Mg Tabs (Atorvastatin Calcium) .... One Tab By Mouth Qhs 2)  Amlodipine Besylate 10 Mg Tabs (Amlodipine Besylate) .... One Tab By Mouth Qd 3)  Vicodin Es 7.5-750 Mg Tabs (Hydrocodone-Acetaminophen) .... Take 1 Tab By Mouth At Bedtime As Needed 4)  Diclofenac Sodium 75 Mg Tbec (Diclofenac Sodium) .... Take 1 Tablet By Mouth Two Times A Day  Allergies (verified): 1)  ! Pcn 2)  ! Asa  Review of Systems      See HPI Eyes:  Denies discharge, halos, and red eye. Derm:  Complains of itching and  lesion(s). Psych:  Complains of anxiety; denies depression. Endo:  Denies excessive thirst and excessive urination. Heme:  Denies abnormal bruising and bleeding. Allergy:  Denies hives or rash and itching eyes.  Physical Exam  General:  Well-developed,well-nourished,in no acute distress; alert,appropriate and cooperative throughout examination HEENT: No facial asymmetry,  EOMI, No sinus tenderness, TM's Clear, oropharynx  pink and moist.   Chest: Clear to auscultation bilaterally.  CVS: S1, S2, No murmurs, No S3.   Abd: Soft, Nontender.  MS: Adequate ROM spine, hips, shoulders and knees.  Ext: No edema.   CNS: CN 2-12 intact, power tone and sensation normal throughout.   Skin: Intact, macular slightly hyperpigmented rash perianally, no evidence of bacterial or fungal infection though slightly tender.  Psych: Good eye contact, normal affect.  Memory intact, not anxious or depressed appearing.    Impression & Recommendations:  Problem # 1:  IMPAIRED FASTING GLUCOSE (ICD-790.21) Assessment Comment Only  Orders: T- Hemoglobin A1C (83036-23375)the impt of weight loss with regular exercise and reduced carb diet stressed  Labs Reviewed: Creat: 1.02 (05/25/2010)     Problem # 2:  SHOULDER PAIN, RIGHT (ICD-719.41) Assessment: Improved  The following medications were removed from the medication list:    Ibuprofen 800 Mg Tabs (Ibuprofen) ..... One tab by mouth tid His updated medication list for this problem includes:    Vicodin Es 7.5-750 Mg Tabs (Hydrocodone-acetaminophen) .Marland Kitchen... Take 1 tab  by mouth at bedtime as needed    Diclofenac Sodium 75 Mg Tbec (Diclofenac sodium) .Marland Kitchen... Take 1 tablet by mouth two times a day  Problem # 3:  GOUT WITH OTHER SPECIFIED MANIFESTATIONS (ICD-274.89) Assessment: Improved  The following medications were removed from the medication list:    Allopurinol 300 Mg Tabs (Allopurinol) .Marland Kitchen... Take 1 tablet by mouth once a day  l  Problem # 4:   HYPERLIPIDEMIA (ICD-272.4) Assessment: Improved  His updated medication list for this problem includes:    Lipitor 40 Mg Tabs (Atorvastatin calcium) ..... One tab by mouth qhs  Orders: T-Hepatic Function [phone]) T-Lipid Profile [phone])  Labs Reviewed: SGOT: 23 (05/25/2010)   SGPT: 31 (05/25/2010)   HDL:46 (05/25/2010), 46 (12/29/2009)  LDL:85 (05/25/2010), 132 (50/08/3817)  Chol:144 (05/25/2010), 193 (12/29/2009)  Trig:63 (05/25/2010), 73 (12/29/2009)  Problem # 5:  ESSENTIAL HYPERTENSION, BENIGN (ICD-401.1) Assessment: Improved  His updated medication list for this problem includes:    Amlodipine Besylate 10 Mg Tabs (Amlodipine besylate) ..... One tab by mouth qd  Orders: T-Basic Metabolic Panel ([id number])  BP today: 130/72 Prior BP: 140/80 (02/07/2010)  Labs Reviewed: K+: 4.8 (05/25/2010) Creat: : 1.02 (05/25/2010)   Chol: 144 (05/25/2010)   HDL: 46 (05/25/2010)   LDL: 85 (05/25/2010)   TG: 63 (05/25/2010)  Problem # 6:  DERMATITIS (ICD-692.9) Assessment: Comment Only doxycycline prescribed  Complete Medication List: 1)  Lipitor 40 Mg Tabs (Atorvastatin calcium) .... One tab by mouth qhs 2)  Amlodipine Besylate 10 Mg Tabs (Amlodipine besylate) .... One tab by mouth qd 3)  Vicodin Es 7.5-750 Mg Tabs (Hydrocodone-acetaminophen) .... Take 1 tab by mouth at bedtime as needed 4)  Diclofenac Sodium 75 Mg Tbec (Diclofenac sodium) .... Take 1 tablet by mouth two times a day 5)  Doxycycline Hyclate 100 Mg Caps (Doxycycline hyclate) .... Take 1 capsule by mouth two times a day  Patient Instructions: 1)  Please schedule a follow-up appointment in 4 months. 2)  It is important that you exercise regularly at least 20 minutes 5 times a week. If you develop chest pain, have severe difficulty breathing, or feel very tired , stop exercising immediately and seek medical attention. 3)  You need to lose weight. Consider a lower calorie diet and regular exercise.  4)  labs  abnd blood pressure are great , no med changes. 5)  one week of med is sent in for the area of concern, you can finish using the med you already have on it, and i am sending in 1 week of antibiotic also, pls call if theproblem does not go away Prescriptions: DOXYCYCLINE HYCLATE 100 MG CAPS (DOXYCYCLINE HYCLATE) Take 1 capsule by mouth two times a day  #14 x 0   Entered and Authorized by:   Syliva Overman MD   Signed by:   Syliva Overman MD on 05/29/2010   Method used:   Electronically to        [facility] [phone]* (retail)       [address]       [place], [place]  [address]       Ph: [phone] or [phone]       Fax: [phone]   RxID:   [id number]

## 2011-01-14 NOTE — Progress Notes (Signed)
Summary: refill  Phone Note Call from Patient   Summary of Call: his meds. hasn't been called into [facility] in [place]. [phone] Initial call taken by: Rudene Anda,  February 12, 2010 3:38 PM  Follow-up for Phone Call        Rx Called In Follow-up by: Adella Hare LPN,  February 12, 2010 3:43 PM    Prescriptions: VICODIN ES 7.5-750 MG TABS (HYDROCODONE-ACETAMINOPHEN) Take 1 tab by mouth at bedtime as needed  #30 x 1   Entered by:   Adella Hare LPN   Authorized by:   Syliva Overman MD   Signed by:   Adella Hare LPN on 62/95/2841   Method used:   Printed then faxed to ...       [address] [phone]* (retail)       [address]       [place], [place]  [address]       Ph: [phone] or [phone]       Fax: [phone]   RxID:   [id number]

## 2011-01-14 NOTE — Letter (Signed)
Summary: Letter  Letter   Imported By: Lind Guest 01/02/2010 13:11:17  _____________________________________________________________________  External Attachment:    Type:   Image     Comment:   External Document

## 2011-01-14 NOTE — Letter (Signed)
Summary: Letter  Letter   Imported By: Lind Guest 10/02/2010 17:13:10  _____________________________________________________________________  External Attachment:    Type:   Image     Comment:   External Document

## 2011-01-14 NOTE — Letter (Signed)
Summary: ADD LAB ON  ADD LAB ON   Imported By: Lind Guest 10/01/2010 09:16:10  _____________________________________________________________________  External Attachment:    Type:   Image     Comment:   External Document

## 2011-01-14 NOTE — Assessment & Plan Note (Signed)
Summary: office visit   Vital Signs:  Patient profile:   60 year old male Height:      70 inches Weight:      219 pounds BMI:     31.54 O2 Sat:      98 % on Room air Pulse rate:   98 / minute Pulse rhythm:   regular Resp:     16 per minute BP sitting:   160 / 100  (left arm)  Vitals Entered By: Worthy Keeler LPN (December 27, 2009 4:11 PM)  Nutrition Counseling: Patient's BMI is greater than 25 and therefore counseled on weight management options.  O2 Flow:  Room air CC: follow-up visit Is Patient Diabetic? No Pain Assessment Patient in pain? no        Primary Care Provider:  Syliva Overman MD  CC:  follow-up visit.  History of Present Illness: Pt is here to re-establish care. He has been unisured for a while ,being out ofwork, and states he actually was nottaking any medication for at least 7 months.  He has resumed a job since [employer], reports losing a significant amt of weight because of increased activity on the job, as well as attempts at improving hisdiet. He deniesany recenr fever or chills. He denies excessive sinus pressure, sore throat or productive cough. he denies chest tightness,, pND, orthopnea or leg swelling. he does have a h/o irregular heartbeat, and is concerned about f/u needed with cardiology for this. He has seen Dr. Dietrich Pates in the past, however base on info received , pt will not be able to get to the clinic early enough to be seen as last appts are reportedly at 3:30, I will check into this further. he denies depression, anxiety or insomnia. H ehas had no recentgout flares.    Current Medications (verified): 1)  None  Allergies (verified): 1)  ! Pcn 2)  ! Asa  Review of Systems      See HPI Eyes:  Denies blurring and discharge. GI:  Denies abdominal pain, constipation, nausea, and vomiting. GU:  Denies dysuria, nocturia, urinary frequency, and urinary hesitancy. MS:  Complains of joint pain and stiffness; right shoulder pain and   tenderness inc in the past 1 month. Derm:  Denies itching, lesion(s), and rash. Neuro:  Denies headaches. Heme:  Denies abnormal bruising and bleeding. Allergy:  Complains of seasonal allergies; reports nasal congestion , clear mucus intermittently.  Physical Exam  General:  Well-developed,well-nourished,in no acute distress; alert,appropriate and cooperative throughout examination HEENT: No facial asymmetry,  EOMI, No sinus tenderness, TM's Clear, oropharynx  pink and moist.   Chest: Clear to auscultation bilaterally.  CVS: S1, S2, No murmurs, No S3.   Abd: Soft, Nontender.  MS: Adequate ROM spine, hips,  and knees. reduced mobility right shoulder. Ext: No edema.   CNS: CN 2-12 intact, power tone and sensation normal throughout.   Skin: Intact, no visible lesions or rashes.  Psych: Good eye contact, normal affect.  Memory intact, not anxious or depressed appearing.    Impression & Recommendations:  Problem # 1:  SHOULDER PAIN, RIGHT (ICD-719.41) Assessment Deteriorated  The following medications were removed from the medication list:    Indomethacin 25 Mg Caps (Indomethacin) .Marland Kitchen... Take one tab three times a day, then one tab two times a day, then one tab once daily for 3 days His updated medication list for this problem includes:    Ibuprofen 800 Mg Tabs (Ibuprofen) ..... One tab by mouth tid  Orders: Ketorolac-Toradol  15mg  (E4540) Admin of Therapeutic Inj  intramuscular or subcutaneous (98119)  Problem # 2:  IMPAIRED GLUCOSE TOLERANCE (ICD-271.3) Assessment: Comment Only hBA1C added to labs  Problem # 3:  OBESITY, UNSPECIFIED (ICD-278.00) Assessment: Improved  Ht: 70 (12/27/2009)   Wt: 219 (12/27/2009)   BMI: 31.54 (12/27/2009)  Problem # 4:  HYPERLIPIDEMIA (ICD-272.4)  The following medications were removed from the medication list:    Zocor 80 Mg Tabs (Simvastatin) .Marland Kitchen... Take 1 tab by mouth at bedtime    Lipitor 40 Mg Tabs (Atorvastatin calcium) .Marland Kitchen... Take one tab  at bedtime His updated medication list for this problem includes:    Lipitor 40 Mg Tabs (Atorvastatin calcium) ..... One tab by mouth qhs  Orders: T-Hepatic Function [phone]) T-Lipid Profile [phone])  Labs Reviewed: SGOT: 24 (06/28/2008)   SGPT: 31 (06/28/2008)   HDL:55 (06/28/2008), 43 (03/27/2008)  LDL:127 (06/28/2008), 130 (03/27/2008)  Chol:198 (06/28/2008), 191 (03/27/2008)  Trig:80 (06/28/2008), 92 (03/27/2008)  Problem # 5:  ESSENTIAL HYPERTENSION, BENIGN (ICD-401.1) Assessment: Deteriorated  The following medications were removed from the medication list:    Amlodipine Besy-benazepril Hcl 5-20 Mg Caps (Amlodipine besy-benazepril hcl) .Marland Kitchen... Take two caps by mouth once daily    Doxazosin Mesylate 4 Mg Tabs (Doxazosin mesylate) .Marland Kitchen... Take one and one half tabs by mouth once daily for 14 days then  one once daily thereafter    Chlorthalidone 25 Mg Tabs (Chlorthalidone) .Marland Kitchen... Take one half tab by mouth once daily    Chlorthalidone 25 Mg Tabs (Chlorthalidone) .Marland Kitchen... Take 1/2 tablet daily His updated medication list for this problem includes:    Amlodipine Besylate 10 Mg Tabs (Amlodipine besylate) ..... One tab by mouth qd  BP today: 160/100 Prior BP: 120/80 (11/03/2008)  Labs Reviewed: K+: 4.3 (06/28/2008) Creat: : 1.22 (06/28/2008)   Chol: 198 (06/28/2008)   HDL: 55 (06/28/2008)   LDL: 127 (06/28/2008)   TG: 80 (06/28/2008)  Orders: T-Basic Metabolic Panel [id number]) EKG w/ Interpretation (93000)  Problem # 6:  PREMATURE ATRIAL CONTRACTIONS (ICD-427.61) Assessment: Comment Only eKG with interpretation;sinus rythm with marked sinusarrythmia, will attempt to re-establish carewith card  Complete Medication List: 1)  Prednisone (pak) 5 Mg Tabs (Prednisone) .... Uad 2)  Ibuprofen 800 Mg Tabs (Ibuprofen) .... One tab by mouth tid 3)  Lipitor 40 Mg Tabs (Atorvastatin calcium) .... One tab by mouth qhs 4)  Amlodipine Besylate 10 Mg Tabs (Amlodipine besylate) .... One  tab by mouth qd 5)  Allopurinol 300 Mg Tabs (Allopurinol) .... Take 1 tablet by mouth once a day  Other Orders: T-CBC w/Diff ([id number]) T-TSH [phone]) T-PSA (59563-87564) T-Uric Acid (Blood) [phone])  Patient Instructions: 1)  CpE in 6 weeks  pt has to get it at 4pm because of his work schedule , and he may be a little late, work ends at 3:30 and he is in [place] 2)  BMP prior to visit, ICD-9: 3)  Hepatic Panel prior to visit, ICD-9: 4)  Lipid Panel prior to visit, ICD-9: 5)  TSH prior to visit, ICD-9:  fasting asap 6)  CBC w/ Diff prior to visit, ICD-9: 7)  PSA prior to visit, ICD-9: 8)  Uric acid level 9)  New meds for your blood pressure, cholesterol and the shoulder pain. 10)  You will  get an injection for your shoulder pain Prescriptions: ALLOPURINOL 300 MG TABS (ALLOPURINOL) Take 1 tablet by mouth once a day  #30 x 0   Entered and Authorized by:   Syliva Overman MD   Signed by:  Syliva Overman MD on 12/30/2009   Method used:   Printed then faxed to ...       [facility]  [address] [facility] (retail)       [address]       [place], [place]  [address]       Ph: [phone]       Fax: [phone]   RxID:   [id number] AMLODIPINE BESYLATE 10 MG TABS (AMLODIPINE BESYLATE) one tab by mouth qd  #90 x 3   Entered by:   Worthy Keeler LPN   Authorized by:   Syliva Overman MD   Signed by:   Syliva Overman MD on 12/30/2009   Method used:   Handwritten   RxID:   [id number] LIPITOR 40 MG TABS (ATORVASTATIN CALCIUM) one tab by mouth qhs  #90 x 0   Entered by:   Worthy Keeler LPN   Authorized by:   Syliva Overman MD   Signed by:   Syliva Overman MD on 12/30/2009   Method used:   Handwritten   RxID:   [id number] IBUPROFEN 800 MG TABS (IBUPROFEN) one tab by mouth tid  #30 x 0   Entered by:   Worthy Keeler LPN   Authorized by:   Syliva Overman MD   Signed by:   Syliva Overman MD on 12/30/2009   Method used:   Handwritten   RxID:    [id number] PREDNISONE (PAK) 5 MG TABS (PREDNISONE) uad  #21 x 0   Entered by:   Worthy Keeler LPN   Authorized by:   Syliva Overman MD   Signed by:   Syliva Overman MD on 12/30/2009   Method used:   Handwritten   RxID:   [id number]       Medication Administration  Injection # 1:    Medication: Ketorolac-Toradol 15mg     Diagnosis: SHOULDER PAIN, RIGHT (ICD-719.41)    Route: IM    Site: RUOQ gluteus    Exp Date: 07/16/2011    Lot #: [id number]    Mfr: [email or web address]    Comments: toradol 60mg  given    Patient tolerated injection without complications    Given by: Worthy Keeler LPN (December 28, 2009 8:36 AM)  Orders Added: 1)  Est. Patient Level IV [76160] 2)  T-Basic Metabolic Panel [73710-62694] 3)  T-Hepatic Function [80076-22960] 4)  T-Lipid Profile [80061-22930] 5)  T-CBC w/Diff [85462-70350] 6)  T-TSH [09381-82993] 7)  T-PSA [71696-78938] 8)  T-Uric Acid (Blood) [84550-23180] 9)  EKG w/ Interpretation [93000] 10)  Ketorolac-Toradol 15mg  [J1885] 11)  Admin of Therapeutic Inj  intramuscular or subcutaneous [96372] 12)  EKG w/ Interpretation [93000]

## 2011-01-14 NOTE — Progress Notes (Signed)
Summary: appt  Phone Note Call from Patient   Summary of Call: cancelled his appointment  with dr. Hilda Lias he wanted to let you know Initial call taken by: Lind Guest,  February 13, 2010 3:52 PM  Follow-up for Phone Call        Noted  Follow-up by: Everitt Amber LPN,  February 13, 2010 4:02 PM

## 2011-01-27 LAB — CONVERTED CEMR LAB
ALT: 24 units/L (ref 0–53)
AST: 23 units/L (ref 0–37)
Albumin: 4.2 g/dL (ref 3.5–5.2)
Alkaline Phosphatase: 69 units/L (ref 39–117)
BUN: 15 mg/dL (ref 6–23)
Basophils Absolute: 0 10*3/uL (ref 0.0–0.1)
Basophils Relative: 1 % (ref 0–1)
Bilirubin, Direct: 0.1 mg/dL (ref 0.0–0.3)
CO2: 26 meq/L (ref 19–32)
Calcium: 9.3 mg/dL (ref 8.4–10.5)
Chloride: 99 meq/L (ref 96–112)
Cholesterol: 190 mg/dL (ref 0–200)
Creatinine, Ser: 1.1 mg/dL (ref 0.40–1.50)
Eosinophils Absolute: 0.1 10*3/uL (ref 0.0–0.7)
Eosinophils Relative: 2 % (ref 0–5)
Glucose, Bld: 90 mg/dL (ref 70–99)
HCT: 43.2 % (ref 39.0–52.0)
HDL: 53 mg/dL (ref >39)
Hemoglobin: 14.5 g/dL (ref 13.0–17.0)
Hgb A1c MFr Bld: 6.1 % — ABNORMAL HIGH (ref <5.7)
Indirect Bilirubin: 0.4 mg/dL (ref 0.0–0.9)
LDL Cholesterol: 122 mg/dL — ABNORMAL HIGH (ref 0–99)
Lymphocytes Relative: 53 % — ABNORMAL HIGH (ref 12–46)
Lymphs Abs: 1.9 10*3/uL (ref 0.7–4.0)
MCHC: 33.6 g/dL (ref 30.0–36.0)
MCV: 86.2 fL (ref 78.0–100.0)
Monocytes Absolute: 0.3 10*3/uL (ref 0.1–1.0)
Monocytes Relative: 9 % (ref 3–12)
Neutro Abs: 1.3 10*3/uL — ABNORMAL LOW (ref 1.7–7.7)
Neutrophils Relative %: 35 % — ABNORMAL LOW (ref 43–77)
PSA: 0.69 ng/mL (ref <=4.00)
Platelets: 214 10*3/uL (ref 150–400)
Potassium: 4.7 meq/L (ref 3.5–5.3)
RBC: 5.01 M/uL (ref 4.22–5.81)
RDW: 13.8 % (ref 11.5–15.5)
Sodium: 148 meq/L — ABNORMAL HIGH (ref 135–145)
Total Bilirubin: 0.5 mg/dL (ref 0.3–1.2)
Total CHOL/HDL Ratio: 3.6
Total Protein: 6.6 g/dL (ref 6.0–8.3)
Triglycerides: 77 mg/dL (ref <150)
VLDL: 15 mg/dL (ref 0–40)
Vit D, 25-Hydroxy: 15 ng/mL — ABNORMAL LOW (ref 30–89)
WBC: 3.6 10*3/uL — ABNORMAL LOW (ref 4.0–10.5)

## 2011-01-30 ENCOUNTER — Encounter: Payer: Self-pay | Admitting: Family Medicine

## 2011-01-30 ENCOUNTER — Encounter (INDEPENDENT_AMBULATORY_CARE_PROVIDER_SITE_OTHER): Payer: BC Managed Care – PPO | Admitting: Family Medicine

## 2011-01-30 DIAGNOSIS — Z0000 Encounter for general adult medical examination without abnormal findings: Secondary | ICD-10-CM

## 2011-01-30 LAB — CONVERTED CEMR LAB
TSH: 1.007 microintl units/mL (ref 0.350–4.500)
Uric Acid, Serum: 6.5 mg/dL (ref 4.0–7.8)

## 2011-02-03 ENCOUNTER — Encounter: Payer: Self-pay | Admitting: Family Medicine

## 2011-02-11 NOTE — Assessment & Plan Note (Signed)
Summary: physical   Vital Signs:  Patient profile:   60 year old male Height:      70 inches Weight:      224 pounds BMI:     32.26 O2 Sat:      94 % Pulse rate:   70 / minute Pulse rhythm:   irregular Resp:     16 per minute BP sitting:   160 / 90  (left arm) Cuff size:   large  Vitals Entered By: Everitt Amber LPN (January 30, 2011 4:27 PM)  Nutrition Counseling: Patient's BMI is greater than 25 and therefore counseled on weight management options. CC: c/o arthritis pain in both hands x 1 month  Vision Screening:Left eye w/o correction: 20 / 25 Right Eye w/o correction: 20 / 20 Both eyes w/o correction:  20/ 20        Vision Entered By: Adella Hare LPN (January 30, 2011 5:19 PM)   Primary Care Provider:  Syliva Overman MD  CC:  c/o arthritis pain in both hands x 1 month.  History of Present Illness: Reports  that he has been doing fairly well Denies recent fever or chills. Denies sinus pressure, nasal congestion , ear pain or sore throat. Denies chest congestion, or cough productive of sputum. Denies chest pain, palpitations, PND, orthopnea or leg swelling. Denies abdominal pain, nausea, vomitting, diarrhea or constipation. Denies change in bowel movements or bloody stool. the pt has been having difficulty with his hemmorhoids as far as swelling is concerned, and is currently being treated by a surgeon with whom he has follow up. Denies dysuria , frequency, incontinence or hesitancy.  Denies headaches, vertigo, seizures. Denies depression, anxiety or insomnia. Denies  rash, lesions, or itch.     Current Medications (verified): 1)  Amlodipine Besylate 10 Mg Tabs (Amlodipine Besylate) .... One Tab By Mouth Once Daily. 2)  Vicodin Es 7.5-750 Mg Tabs (Hydrocodone-Acetaminophen) .... Take 1 Tab By Mouth At Bedtime As Needed 3)  Diclofenac Sodium 75 Mg Tbec (Diclofenac Sodium) .... Take 1 Tablet By Mouth Two Times A Day 4)  Lovastatin 20 Mg Tabs (Lovastatin)  .... Take 1 Tab By Mouth At Bedtime  Allergies (verified): 1)  ! Pcn 2)  ! Jonne Ply  Past History:  Past Medical History: IMPAIRED GLUCOSE TOLERANCE (ICD-271.3) OBESITY, UNSPECIFIED (ICD-278.00) GOUT WITH OTHER SPECIFIED MANIFESTATIONS (ICD-274.89) HYPERLIPIDEMIA (ICD-272.4) ESSENTIAL HYPERTENSION, BENIGN (ICD-401.1) hemmorhoids followed by Dr Malvin Johns    Review of Systems      See HPI General:  Complains of fatigue. Eyes:  Denies discharge and red eye. MS:  Complains of joint pain, low back pain, and stiffness; stiffness in fingers and back for the past 1 month. Endo:  Denies cold intolerance, excessive hunger, excessive thirst, and heat intolerance. Heme:  Denies abnormal bruising, bleeding, enlarge lymph nodes, and fevers. Allergy:  Denies hives or rash and itching eyes.  Physical Exam  General:  Well-developed,well-nourished,in no acute distress; alert,appropriate and cooperative throughout examination Head:  Normocephalic and atraumatic without obvious abnormalities. No apparent alopecia or balding. Eyes:  No corneal or conjunctival inflammation noted. EOMI. Perrla. Funduscopic exam benign, without hemorrhages, exudates or papilledema. Vision grossly normal. Ears:  External ear exam shows no significant lesions or deformities.  Otoscopic examination reveals clear canals, tympanic membranes are intact bilaterally without bulging, retraction, inflammation or discharge. Hearing is grossly normal bilaterally. Nose:  External nasal examination shows no deformity or inflammation. Nasal mucosa are pink and moist without lesions or exudates. Mouth:  pharynx pink and moist  and fair dentition.   Neck:  No deformities, masses, or tenderness noted. Chest Wall:  No deformities, masses, tenderness or gynecomastia noted. Breasts:  No masses or gynecomastia noted Lungs:  Normal respiratory effort, chest expands symmetrically. Lungs are clear to auscultation, no crackles or wheezes. Heart:   Normal rate and regular rhythm. S1 and S2 normal without gallop, murmur, click, rub or other extra sounds. Abdomen:  Bowel sounds positive,abdomen soft and non-tender without masses, organomegaly or hernias noted. Rectal:  deferred, currently beuing examined and treated for hemmorhoids by a surgeon Genitalia:  deferred Prostate:  deferred Msk:  No deformity or scoliosis noted of thoracic or lumbar spine.   Pulses:  R and L carotid,radial,femoral,dorsalis pedis and posterior tibial pulses are full and equal bilaterally Extremities:  decreased rOM lumbar spine, also swelling and stiffness in the fingers Neurologic:  alert & oriented X3, cranial nerves II-XII intact, strength normal in all extremities, sensation intact to light touch, sensation intact to pinprick, gait normal, and DTRs symmetrical and normal.   Skin:  Intact without suspicious lesions or rashes Cervical Nodes:  No lymphadenopathy noted Axillary Nodes:  No palpable lymphadenopathy Inguinal Nodes:  No significant adenopathy Psych:  Cognition and judgment appear intact. Alert and cooperative with normal attention span and concentration. No apparent delusions, illusions, hallucinations   Impression & Recommendations:  Problem # 1:  ANAL OR RECTAL PAIN (WJX-914.78) Assessment Unchanged being treaqted for hemmorhoids by surgery.  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 One tab by mouth once daily.    Benazepril Hcl 20 Mg Tabs (Benazepril hcl) .Marland Kitchen... Take 1 tablet by  mouth once a day Patient advised to follow low sodium diet rich in fruit and vegetables, and to commit to at least 30 minutes 5 days per week of regular exercise , to improve blood presure control.   Orders: T-Basic Metabolic Panel [id number])  BP today: 160/90 Prior BP: 132/82 (09/30/2010)  Labs Reviewed: K+: 4.7 (01/25/2011) Creat: : 1.10 (01/25/2011)   Chol: 190 (01/25/2011)   HDL: 53 (01/25/2011)   LDL: 122 (01/25/2011)   TG: 77 (01/25/2011)  Problem # 5:  HYPERLIPIDEMIA (ICD-272.4) Assessment: Deteriorated  His updated medication list for this problem includes:    Lovastatin 20 Mg Tabs (Lovastatin) .Marland Kitchen... Take 1 tab by mouth at bedtime Low fat dietdiscussed and encouraged  Orders: T-Lipid Profile ([id number])  Labs Reviewed: SGOT: 23 (01/25/2011)   SGPT: 24 (01/25/2011)   HDL:53 (01/25/2011), 59 (09/28/2010)  LDL:122 (01/25/2011), 108 (09/28/2010)  Chol:190 (01/25/2011), 180 (09/28/2010)  Trig:77 (01/25/2011), 64 (09/28/2010)  Problem # 6:  ARTHRITIS (ICD-716.90) Assessment: Deteriorated prednisone dose pack prescribed  Complete Medication List: 1)  Amlodipine Besylate 10 Mg Tabs (Amlodipine besylate) .... One tab by mouth once daily. 2)  Vicodin Es 7.5-750 Mg Tabs (Hydrocodone-acetaminophen) .... Take 1 tab by mouth at bedtime as needed 3)  Diclofenac Sodium 75 Mg Tbec (Diclofenac sodium) .... Take 1 tablet by mouth two times a day 4)  Lovastatin 20 Mg Tabs (Lovastatin) .... Take 1 tab by mouth at bedtime 5)  Prednisone (pak) 5 Mg Tabs (Prednisone) .... Use  as directed 6)  Vitamin D (ergocalciferol) 50000 Unit Caps (Ergocalciferol) .... One capsule once weekly 7)  Multivitamin  .... One tablet daily 8)  Benazepril Hcl 20 Mg Tabs (Benazepril hcl) .... Take 1 tablet by mouth once a day  Other Orders: T- Hemoglobin A1C ([id number]) T-Vitamin D (25-Hydroxy) ([id number])  Patient Instructions: 1)  Follow up appointment in 5.21months. Nurse bP check in 1 month 2)  It  is important that you exercise regularly at least 20 minutes 5 times a week. If you develop chest pain, have severe difficulty breathing, or feel very tired , stop exercising immediately and seek medical attention. 3)  You need to lose weight. Consider a lower calorie diet and regular exercise.  4)  BMP prior to visit, ICD-9: 5)  Lipid Panel prior to visit, ICD-9: fasting in 5.5 months 6)  HbgA1C prior to visit, ICD-9:vitamin D 7)  Pls call and let us know when you want the cardiology  referral Prescriptions: BENAZEPRIL HCL 20 MG TABS (BENAZEPRIL HCL) Take 1 tablet by mouth once a day  #30 x 3   Entered and Authorized by:   Syliva Overman MD   Signed by:   Syliva Overman MD on 01/30/2011   Method used:   Printed then faxed to ...       [facility] [address] (retail)       [address]       [place], [place]  [address]  [place]       Ph: [phone]       Fax: [phone]   RxID:   [id number] VITAMIN D (ERGOCALCIFEROL) 50000 UNIT CAPS (ERGOCALCIFEROL) one capsule once weekly  #12 x 1   Entered and Authorized by:   Syliva Overman MD   Signed by:   Syliva Overman MD on 01/30/2011   Method used:   Printed then faxed to ...       [facility] [address] (retail)       [address]       [place], [place]  [address]  [place]       Ph: [phone]       Fax: [phone]   RxID:   [id number] PREDNISONE (PAK) 5 MG TABS (PREDNISONE) Use as directed  #21 x 0   Entered and Authorized by:   Syliva Overman MD   Signed by:   Syliva Overman MD on 01/30/2011   Method used:   Printed then faxed to ...       [facility] [address] (retail)       [address]       [place], [place]  [address]  [place]       Ph: [phone]       Fax: [phone]   RxID:   [id number] PREDNISONE (PAK) 5 MG TABS (PREDNISONE) Use as directed  #21 x 0   Entered and Authorized by:   Syliva Overman MD   Signed by:   Syliva Overman MD on 01/30/2011   Method used:   Electronically to        [facility] [phone]* (retail)       [address]       [place], [place]  [address]       Ph: [phone] or [phone]       Fax: [phone]   RxID:   [id number]  Orders Added: 1)  Est. Patient Level IV [[address]] 2)  T-Basic Metabolic Panel [80048-22910] 3)  T-Lipid Profile [80061-22930] 4)  T- Hemoglobin A1C [83036-23375] 5)  T-Vitamin D (25-Hydroxy) [phone]

## 2011-02-11 NOTE — Letter (Signed)
Summary: add on lab order  add on lab order   Imported By: Luann Bullins 02/03/2011 11:20:00  _____________________________________________________________________  External Attachment:    Type:   Image     Comment:   External Document

## 2011-03-10 ENCOUNTER — Encounter: Payer: Self-pay | Admitting: Family Medicine

## 2011-03-11 ENCOUNTER — Encounter: Payer: Self-pay | Admitting: Family Medicine

## 2011-03-12 ENCOUNTER — Ambulatory Visit: Payer: BC Managed Care – PPO | Admitting: Family Medicine

## 2011-03-13 ENCOUNTER — Ambulatory Visit (INDEPENDENT_AMBULATORY_CARE_PROVIDER_SITE_OTHER): Payer: BC Managed Care – PPO

## 2011-03-13 VITALS — BP 140/76 | Wt 222.0 lb

## 2011-03-13 DIAGNOSIS — M79643 Pain in unspecified hand: Secondary | ICD-10-CM

## 2011-03-13 DIAGNOSIS — I10 Essential (primary) hypertension: Secondary | ICD-10-CM

## 2011-03-13 DIAGNOSIS — M79609 Pain in unspecified limb: Secondary | ICD-10-CM

## 2011-03-13 MED ORDER — HYDROCODONE-ACETAMINOPHEN 5-500 MG PO TABS: 1.0000 | ORAL_TABLET | Freq: Every day | ORAL | Status: AC

## 2011-03-13 MED ORDER — BENAZEPRIL HCL 40 MG PO TABS: 40.0000 mg | ORAL_TABLET | Freq: Every day | ORAL | Status: DC

## 2011-03-17 ENCOUNTER — Telehealth: Payer: Self-pay | Admitting: Family Medicine

## 2011-03-17 NOTE — Telephone Encounter (Signed)
Called script to pharmacy

## 2011-03-20 NOTE — Progress Notes (Signed)
Dose increase to Benazepril 40mg  one daily

## 2011-04-29 NOTE — Letter (Signed)
September 01, 2008    Marvin [name]. Lodema Hong, M.D.  [address]  [place], [place] [address]   RE:  Marvin [name], Marvin [name]  MRN:  [id number]  /  DOB:  07-02-1951   Dear Marvin [name]:   Marvin [name] returns to the office for continued assessment and treatment  of hypertension and hyperlipidemia.  He has had palpitations in the past  and has known premature atrial depolarizations.  Since I last saw him,  he failed to tolerate an increased dose of clonidine, complaining of  fatigue.  This medication was discontinued and Cardura started at a dose  of 4 mg daily.  Otherwise, his medications are unchanged and include  Lotrel 5/20 mg daily, chlorthalidone 12.5 mg daily, and atorvastatin 40  mg daily.   PHYSICAL EXAMINATION:  GENERAL:  On exam, pleasant, well-appearing  gentleman.  VITAL SIGNS:  The weight is 229, 2 pounds less than in May.  Blood  pressure 125/70, heart rate 60 and regular, respirations 14.  NECK:  No jugular venous distention; no carotid bruits.  LUNGS:  Clear.  CARDIAC:  Normal first and second heart sounds; fourth heart sound  present.  ABDOMEN:  Soft and nontender; no bruits.  EXTREMITIES:  No edema.   IMPRESSION:  Marvin [name] is doing very well with current therapy.  A  chemistry profile and repeat lipid profile will be obtained.  If results  are good, I will plan to reassess this nice gentleman in 1 year.  Please  call me prior to that if I can offer additional assistance in the  interim.    Sincerely,      Gerrit Friends. Dietrich Pates, MD, [facility]  Electronically Signed    RMR/MedQ  DD: 09/01/2008  DT: 09/02/2008  Job #: [id number]

## 2011-04-29 NOTE — Letter (Signed)
May 02, 2008    Marvin [name]. Marvin [name], M.D.  [address]  [place], [place] [address]   RE:  Marvin [name], Marvin [name]  MRN:  [id number]  /  DOB:  1951/04/23   Dear Marvin [name]:   Marvin [name] returns to the office at your request for continued  assessment of hypertension and hyperlipidemia.  He was last seen by Dr.  Dorethea [name] some 2 years ago.  He has done well since with no  hospitalizations and no cardiopulmonary symptoms.  Blood pressure  control has been fairly good.  He was recently started on atorvastatin  for suboptimal, but not terrible lipid profile.   OTHER MEDICATIONS:  1. Cozaar 100 mg daily.  2. Lotrel 5/20 mg daily.  3. Verapamil 180 mg daily.  4. Clonidine 0.3 mg q.h.s.   PHYSICAL EXAMINATION:  GENERAL:  Pleasant gentleman in no acute  distress.  VITAL SIGNS:  Weight is 231, 3 pounds more than in May 2007.  Blood  pressure  140/75, heart rate 70 and regular, respirations 18.  NECK:  No jugular venous distention; no carotid bruits.  LUNGS:  Clear.  CARDIAC:  Normal first and second heart sounds; fourth heart sound  present.  ABDOMEN:  Soft and nontender; no organomegaly.  EXTREMITIES:  No edema; distal pulses intact.   DIAGNOSTICS:  Rhythm strip:  Sinus bradycardia with frequent PACs.   IMPRESSION:  Marvin [name] is doing fairly well.  I suspect that  atorvastatin will bring his LDL cholesterol into a desirable range.  His  blood pressure medicine is somewhat redundant and perhaps suboptimal.  We will stop verapamil which could possibly be contributing to his sinus  bradycardia and start chlorthalidone 12.5 mg daily.  The dose will be  advanced if necessary.  A chemistry profile and nursing visit will be  performed in 1 month with a return visit to me in 3 months.  He will  also increase clonidine to 0.3 mg b.i.d. if he does not experience  adverse effects.  I would like to see his blood pressure closer to 130  systolic.    Sincerely,      Marvin Friends. Dietrich Pates,  MD, [facility]  Electronically Signed    RMR/MedQ  DD: 05/02/2008  DT: 05/02/2008  Job #: [id number]

## 2011-05-02 NOTE — Op Note (Signed)
NAME:  Marvin [name], Marvin [name]                         ACCOUNT NO.:  [id number]   MEDICAL RECORD NO.:  [id number]                   PATIENT TYPE:  AMB   LOCATION:  DAY                                  FACILITY:  [facility]   PHYSICIAN:  R. Roetta Sessions, M.D.              DATE OF BIRTH:  [birth year]   DATE OF PROCEDURE:  08/09/2004  DATE OF DISCHARGE:                                 OPERATIVE REPORT   PROCEDURE:  Colonoscopy with snare polypectomy.   INDICATIONS FOR PROCEDURE:  The patient is a 60 year old gentleman with  noted GI symptoms.  No prior history of colonoscopy.  Referred by the  courtesy of Dr. Syliva Overman for colorectal cancer screening.  This  approach has been discussed with the patient at length.  The potential  risks, benefits, and alternatives have been reviewed and questions answered.  He is agreeable.  Please see the H&P on the chart for more information.   PROCEDURE:  O2 saturation, blood pressure, pulses, and respirations were  monitored throughout the entirety of the procedure.  Conscious sedation was  with Versed 2 mg IV, Demerol 50 mg IV in divided doses.  The instrument used  was the Olympus video chip system.   FINDINGS:  Digital rectal examination revealed no abnormalities.   ENDOSCOPIC FINDINGS:  The prep was good.   Rectum:  Examination of the rectal mucosa including retroflex view of the  anal verge revealed no abnormalities.   Colon:  The colonic mucosa was surveyed from the rectosigmoid junction  through the left, transverse, right colon to the area of the appendiceal  orifice, ileocecal valve, and cecum.  These structures were well-seen and  photographed for the record.  From this level, the scope was slowly  withdrawn.  All previously mentioned mucosal surfaces were again seen.  The  terminal ileum was intubated to 10 cm.  The patient was noted to have  scattered sigmoid diverticula.  There were two 5-mm pedunculated polyps at  35 cm which were removed  with cold snare technique.  The remainder of the  colonic mucosa and terminal ileum mucosa appeared normal.  The patient  tolerated the procedure well and was reactive in endoscopy.   IMPRESSION:  1. Normal rectum.  2. Sigmoid diverticula.  3. Polyps in the sigmoid colon resected, as described above.  The remainder     of the colonic mucosa and terminal ileum appeared normal.   RECOMMENDATIONS:  1. No aspirin or arthritis medications for 10 days.  2. Follow up on pathology.  3. Diverticulosis literature provided to [name]  4. Further recommendations to follow.      ___________________________________________                                            Jonathon Bellows, M.D.  RMR/MEDQ  D:  08/09/2004  T:  08/09/2004  Job:  [id number]   cc:   Milus Mallick. Lodema Hong, M.D.  [address]  [place], [place] [address]  Fax: [phone]

## 2011-05-02 NOTE — Procedures (Signed)
   NAME:  Marvin [name], Marvin [name]                         ACCOUNT NO.:  [id number]   MEDICAL RECORD NO.:  [id number]                   PATIENT TYPE:  OUT   LOCATION:  RAD                                  FACILITY:  [facility]   PHYSICIAN:  [place] Bing, M.D.               DATE OF BIRTH:  1951-10-18   DATE OF PROCEDURE:  DATE OF DISCHARGE:                                  ECHOCARDIOGRAM   REFERRING PHYSICIANS:  Milus Mallick. Lodema Hong, M.D. and Jesse Sans. Wall, M.D.   CLINICAL DATA:  A 60 year old gentleman with chest pain and hypertension.   M-MODE:  AORTA:  3.6  (<4.0)  LEFT ATRIUM:  3.7  (<4.0)  SEPTUM:  1.5  (0.7-1.1)  POSTERIOR WALL:  1.3  (0.7-1.1)  LV-DIASTOLE:  4.7  (<5.7)  LV-SYSTOLE:  3.1  (<4.0)   FINDINGS/IMPRESSION:  1. A technically adequate echocardiographic study.  2. Normal left atrium, right atrium, and right ventricle.  3. Normal aortic, mitral, tricuspid and pulmonic valves.  4. Normal Doppler study with trivial tricuspid regurgitation.  5. Normal internal dimension of the left ventricle; mild concentric LVH.     Normal regional and global LV systolic function.  6. Normal IVC.                                               [place] Bing, M.D.    RR/MEDQ  D:  08/30/2003  T:  08/30/2003  Job:  [id number]

## 2011-06-09 ENCOUNTER — Other Ambulatory Visit: Payer: Self-pay | Admitting: Family Medicine

## 2011-06-23 ENCOUNTER — Other Ambulatory Visit: Payer: Self-pay

## 2011-06-26 ENCOUNTER — Encounter: Payer: Self-pay | Admitting: Physician Assistant

## 2011-07-02 ENCOUNTER — Ambulatory Visit: Payer: BC Managed Care – PPO | Admitting: Adult Health

## 2011-07-02 ENCOUNTER — Encounter: Payer: Self-pay | Admitting: Physician Assistant

## 2011-07-02 ENCOUNTER — Ambulatory Visit (INDEPENDENT_AMBULATORY_CARE_PROVIDER_SITE_OTHER): Payer: BC Managed Care – PPO | Admitting: Physician Assistant

## 2011-07-02 DIAGNOSIS — E785 Hyperlipidemia, unspecified: Secondary | ICD-10-CM

## 2011-07-02 DIAGNOSIS — R0609 Other forms of dyspnea: Secondary | ICD-10-CM

## 2011-07-02 DIAGNOSIS — I10 Essential (primary) hypertension: Secondary | ICD-10-CM

## 2011-07-02 DIAGNOSIS — E739 Lactose intolerance, unspecified: Secondary | ICD-10-CM

## 2011-07-02 DIAGNOSIS — E782 Mixed hyperlipidemia: Secondary | ICD-10-CM

## 2011-07-02 DIAGNOSIS — R0989 Other specified symptoms and signs involving the circulatory and respiratory systems: Secondary | ICD-10-CM

## 2011-07-02 DIAGNOSIS — I491 Atrial premature depolarization: Secondary | ICD-10-CM

## 2011-07-02 DIAGNOSIS — I4949 Other premature depolarization: Secondary | ICD-10-CM

## 2011-07-02 DIAGNOSIS — R0600 Dyspnea, unspecified: Secondary | ICD-10-CM | POA: Insufficient documentation

## 2011-07-02 MED ORDER — HYDROCHLOROTHIAZIDE 25 MG PO TABS: 25.0000 mg | ORAL_TABLET | Freq: Every day | ORAL | Status: DC

## 2011-07-02 NOTE — Patient Instructions (Addendum)
**Note De-Identified  Obfuscation** Your physician has recommended you make the following change in your medication: Start taking hydrochlorothiazide 25 mg daily  Your physician recommends that you schedule a follow-up appointment in: 3 months  Your physician recommends that you start a 2 gram sodium diet  Your physician recommends that you decrease caffeine intake  Please call this office at [phone] if you experience recurrence of shortness of breath

## 2011-07-02 NOTE — Assessment & Plan Note (Signed)
Treated by Dr. Lodema Hong

## 2011-07-02 NOTE — Assessment & Plan Note (Signed)
Being followed by Dr. Lodema Hong

## 2011-07-02 NOTE — Progress Notes (Signed)
HPI  This is a 60 year old African American male patient of Dr. Syliva Overman who has not been seen here since 2009 because of lack of insurance. He returns here today for follow-up of premature atrial contractions, hypertension, and hyperlipidemia.  His blood pressure has been elevated and he was asked to follow low-sodium diet and his Lotensin was increased to 40 mg daily. The patient continues to get a lot of salt in his diet eating the package foods as well as eating out at [employer] and [employer]. He knows he can do better. He does take his blood pressure home and it usually runs about 150-160/84.   Last week while walking on his job he said he felt extremely tired and became short of breath. He works as a Pensions consultant and just did not feel well that day. He has not had an episode of shortness of breath in many years and has not had any since. He does admit that he may have been overheated and a little dehydrated. He felt better after he had a cup of cold water. He denies any associated chest pain, palpitations, dizziness, or presyncope. He knows his heart skipped because he takes his pulse and refill it that he never had palpitations or an awareness of this unless you taking his blood pressure or pulse. He did not take his pulse during this episode.  The patient also admits to drinking 6 cups of coffee daily.   Allergies  Allergen Reactions  . Aspirin   . Penicillins     Current Outpatient Prescriptions on File Prior to Visit  Medication Sig Dispense Refill  . amLODipine (NORVASC) 10 MG tablet TAKE ONE TABLET BY MOUTH EVERY DAY  90 tablet  0  . benazepril (LOTENSIN) 20 MG tablet Take 40 mg by mouth daily.       . Multiple Vitamin (MULTIVITAMIN) capsule Take 1 capsule by mouth daily.        . Vitamin D, Ergocalciferol, (DRISDOL) 50000 UNITS CAPS Take 50,000 Units by mouth every 7 (seven) days.          Past Medical History  Diagnosis Date  . Impaired glucose tolerance   .  Obesity   . Gout   . Hyperlipidemia   . Hypertension     Past Surgical History  Procedure Date  . Right knee arthroscopy   . Colonoscopy     08/09/2004    Family History  Problem Relation Age of Onset  . Alzheimer's disease Mother   . Diabetes Father   . Heart failure Father     prostate disease   . Diabetes Brother   . Hypertension Brother   . Cancer Brother     History   Social History  . Marital Status: Married    Spouse Name: N/A    Number of Children: N/A  . Years of Education: N/A   Occupational History  . [employer]     Social History Main Topics  . Smoking status: Never Smoker   . Smokeless tobacco: Never Used  . Alcohol Use: No  . Drug Use: No  . Sexually Active: Not on file   Other Topics Concern  . Not on file   Social History Narrative  . No narrative on file    ROS: See HPI Eyes: Negative Ears:Negative for hearing loss, tinnitus Cardiovascular: Negative for chest pain, palpitations, near-syncope, orthopnea, paroxysmal nocturnal dyspnia and syncope,edema, claudication, cyanosis,.  Respiratory:   Negative for cough, hemoptysis, sleep disturbances due to breathing,  sputum production and wheezing.   Endocrine: Negative for cold intolerance and heat intolerance.  Hematologic/Lymphatic: Negative for adenopathy and bleeding problem. Does not bruise/bleed easily.  Musculoskeletal: positive for arthritis recently stopped taking his medication because he read the package insert.   Gastrointestinal: Negative for nausea, vomiting, reflux, abdominal pain, diarrhea, constipation.   Genitourinary: Negative for bladder incontinence, dysuria, flank pain, frequency, hematuria, hesitancy, nocturia and urgency.  Neurological: Negative.  Allergic/Immunologic: Negative for environmental allergies.   PHYSICAL EXAM Well-nournished, in no acute distress. Neck: No JVD, HJR, Bruit, or thyroid enlargement Lungs: No tachypnea, clear without wheezing, rales, or  rhonchi Cardiovascular: irregular, PMI not displaced, heart sounds normal, no murmurs, gallops, bruit, thrill, or heave. Abdomen: BS normal. Soft without organomegaly, masses, lesions or tenderness. Extremities: without cyanosis, clubbing or edema. Good distal pulses bilateral SKin: Warm, no lesions or rashes  Musculoskeletal: No deformities Neuro: no focal signs  BP 160/90  Pulse 54  Ht 5\' 10"  (1.778 m)  Wt 220 lb (99.791 kg)  BMI 31.57 kg/m2  SpO2 97%  ZOX:WRUEAV sinus rhythm with PACs poor R wave progression

## 2011-07-02 NOTE — Assessment & Plan Note (Signed)
Patient's blood pressure continues to be elevated. I have added hydrochlorothiazide 25 mg daily. We have given him a 2 g sodium diet to follow. I discussed this in detail with him.

## 2011-07-02 NOTE — Assessment & Plan Note (Addendum)
Patient continues to have PACs. He denies any palpitations. We will continue to follow. I also asked him to decrease his caffeine intake from 6 cups of coffee.

## 2011-07-02 NOTE — Assessment & Plan Note (Signed)
Patient had one episode of dyspnea on exertion while at work last week. He thinks he may have been dehydrated and felt better after cold water. I asked him to call us if he has any recurrent dyspnea on exertion. Have also asked him to decrease his caffeine intake.

## 2011-08-11 ENCOUNTER — Other Ambulatory Visit: Payer: Self-pay | Admitting: Family Medicine

## 2011-09-14 ENCOUNTER — Other Ambulatory Visit: Payer: Self-pay | Admitting: Family Medicine

## 2011-09-27 ENCOUNTER — Other Ambulatory Visit: Payer: Self-pay | Admitting: Family Medicine

## 2011-09-27 LAB — BASIC METABOLIC PANEL
BUN: 19 mg/dL (ref 6–23)
CO2: 28 mEq/L (ref 19–32)
Calcium: 9.1 mg/dL (ref 8.4–10.5)
Chloride: 103 mEq/L (ref 96–112)
Creat: 1.11 mg/dL (ref 0.50–1.35)
Glucose, Bld: 101 mg/dL — ABNORMAL HIGH (ref 70–99)
Potassium: 4.2 mEq/L (ref 3.5–5.3)
Sodium: 139 mEq/L (ref 135–145)

## 2011-09-27 LAB — LIPID PANEL
Cholesterol: 196 mg/dL (ref 0–200)
HDL: 50 mg/dL (ref >39)
LDL Cholesterol: 133 mg/dL — ABNORMAL HIGH (ref 0–99)
Total CHOL/HDL Ratio: 3.9 Ratio
Triglycerides: 65 mg/dL (ref <150)
VLDL: 13 mg/dL (ref 0–40)

## 2011-09-27 LAB — HEMOGLOBIN A1C
Hgb A1c MFr Bld: 6.3 % — ABNORMAL HIGH (ref <5.7)
Mean Plasma Glucose: 134 mg/dL — ABNORMAL HIGH (ref <117)

## 2011-09-29 ENCOUNTER — Ambulatory Visit (INDEPENDENT_AMBULATORY_CARE_PROVIDER_SITE_OTHER): Payer: BC Managed Care – PPO | Admitting: Family Medicine

## 2011-09-29 ENCOUNTER — Encounter: Payer: Self-pay | Admitting: Family Medicine

## 2011-09-29 VITALS — BP 130/78 | HR 69 | Resp 16 | Ht 70.5 in | Wt 215.0 lb

## 2011-09-29 DIAGNOSIS — E669 Obesity, unspecified: Secondary | ICD-10-CM

## 2011-09-29 DIAGNOSIS — I10 Essential (primary) hypertension: Secondary | ICD-10-CM

## 2011-09-29 DIAGNOSIS — Z23 Encounter for immunization: Secondary | ICD-10-CM

## 2011-09-29 DIAGNOSIS — I491 Atrial premature depolarization: Secondary | ICD-10-CM

## 2011-09-29 DIAGNOSIS — R7303 Prediabetes: Secondary | ICD-10-CM

## 2011-09-29 DIAGNOSIS — E785 Hyperlipidemia, unspecified: Secondary | ICD-10-CM

## 2011-09-29 DIAGNOSIS — Z2911 Encounter for prophylactic immunotherapy for respiratory syncytial virus (RSV): Secondary | ICD-10-CM

## 2011-09-29 DIAGNOSIS — R7309 Other abnormal glucose: Secondary | ICD-10-CM

## 2011-09-29 DIAGNOSIS — R5381 Other malaise: Secondary | ICD-10-CM

## 2011-09-29 DIAGNOSIS — R7301 Impaired fasting glucose: Secondary | ICD-10-CM

## 2011-09-29 DIAGNOSIS — Z125 Encounter for screening for malignant neoplasm of prostate: Secondary | ICD-10-CM

## 2011-09-29 LAB — HEPATIC FUNCTION PANEL
ALT: 24 U/L (ref 0–53)
AST: 23 U/L (ref 0–37)
Albumin: 4 g/dL (ref 3.5–5.2)
Alkaline Phosphatase: 66 U/L (ref 39–117)
Bilirubin, Direct: 0.1 mg/dL (ref 0.0–0.3)
Indirect Bilirubin: 0.3 mg/dL (ref 0.0–0.9)
Total Bilirubin: 0.4 mg/dL (ref 0.3–1.2)
Total Protein: 6.5 g/dL (ref 6.0–8.3)

## 2011-09-29 LAB — VITAMIN D 25 HYDROXY (VIT D DEFICIENCY, FRACTURES): Vit D, 25-Hydroxy: 48 ng/mL (ref 30–89)

## 2011-09-29 LAB — URIC ACID: Uric Acid, Serum: 8.9 mg/dL — ABNORMAL HIGH (ref 4.0–7.8)

## 2011-09-29 MED ORDER — PRAVASTATIN SODIUM 80 MG PO TABS: 80.0000 mg | ORAL_TABLET | Freq: Every day | ORAL | Status: DC

## 2011-09-29 NOTE — Patient Instructions (Addendum)
CPE end February.  Fasting labs before next visit.  Continue lovastatin 20mg  two at bedtime, after you finish current new is pravastatin 80mg  one at night. Cut back on fried and fatty foods.  A healthy diet is rich in fruit, vegetables and whole grains. Poultry fish, nuts and beans are a healthy choice for protein rather then red meat. A low sodium diet and drinking 64 ounces of water daily is generally recommended. Oils and sweet should be limited. Carbohydrates especially for those who are diabetic or overweight, should be limited to 30-45 gram per meal. It is important to eat on a regular schedule, at least 3 times daily. Snacks should be primarily fruits, vegetables or nuts.   It is important that you exercise regularly at least 30 minutes 5 times a week. If you develop chest pain, have severe difficulty breathing, or feel very tired, stop exercising immediately and seek medical attention   Blood sugar average is increased, please call and register to attend  A class at the hospital which is free  Shingles vaccine today Fasting labs prior to next visit  No need to continue weekly vit D, pls take one multivitamin  Once daily

## 2011-09-30 ENCOUNTER — Encounter: Payer: Self-pay | Admitting: Cardiology

## 2011-10-01 ENCOUNTER — Encounter: Payer: Self-pay | Admitting: Cardiology

## 2011-10-01 NOTE — Assessment & Plan Note (Signed)
Deteriorated. Patient re-educated about  the importance of commitment to a  minimum of 150 minutes of exercise per week. The importance of healthy food choices with portion control discussed. Encouraged to start a food diary, count calories and to consider  joining a support group. Sample diet sheets offered. Goals set by the patient for the next several months.    

## 2011-10-01 NOTE — Assessment & Plan Note (Signed)
unchanged

## 2011-10-01 NOTE — Assessment & Plan Note (Signed)
Controlled, however now on hctz per cardiology, has had gout flares in the past will check uric acid level and alert cardiology of past history

## 2011-10-01 NOTE — Assessment & Plan Note (Signed)
Deteriorated low carb diet discussed and encouraged, pt also advised to attend diabetic class. Need to lose weight and commit to daily exercise

## 2011-10-01 NOTE — Progress Notes (Signed)
  Subjective:    Patient ID: Marvin [name], male    DOB: 12/06/1951, 60 y.o.   MRN: [id number]  HPI The PT is here for follow up and re-evaluation of chronic medical conditions, medication management and review of any available recent lab and radiology data.  Preventive health is updated, specifically  Cancer screening and Immunization.   Questions or concerns regarding consultations or procedures which the PT has had in the interim are  addressed. The PT denies any adverse reactions to current medications since the last visit.  There are no new concerns.  There are no specific complaints       Review of Systems See HPI Denies recent fever or chills. Denies sinus pressure, nasal congestion, ear pain or sore throat. Denies chest congestion, productive cough or wheezing. Denies chest pains, palpitations and leg swelling Denies abdominal pain, nausea, vomiting,diarrhea or constipation.   Denies dysuria, frequency, hesitancy or incontinence. Denies joint pain, swelling and limitation in mobility. Denies headaches, seizures, numbness, or tingling. Denies depression, anxiety or insomnia. Denies skin break down or rash.        Objective:   Physical Exam  Patient alert and oriented and in no cardiopulmonary distress.  HEENT: No facial asymmetry, EOMI, no sinus tenderness,  oropharynx pink and moist.  Neck supple no adenopathy.  Chest: Clear to auscultation bilaterally.  CVS: S1, S2 no murmurs, no S3.  ABD: Soft non tender. Bowel sounds normal.  Ext: No edema  MS: Adequate ROM spine, shoulders, hips and knees.  Skin: Intact, no ulcerations or rash noted.  Psych: Good eye contact, normal affect. Memory intact not anxious or depressed appearing.  CNS: CN 2-12 intact, power, tone and sensation normal throughout.       Assessment & Plan:

## 2011-10-01 NOTE — Assessment & Plan Note (Signed)
Deteriorated, low fat diet discussed and encouraged, will change to pravastatin 80 mg also

## 2011-10-02 ENCOUNTER — Ambulatory Visit (INDEPENDENT_AMBULATORY_CARE_PROVIDER_SITE_OTHER): Payer: BC Managed Care – PPO | Admitting: Cardiology

## 2011-10-02 ENCOUNTER — Encounter: Payer: Self-pay | Admitting: Cardiology

## 2011-10-02 VITALS — BP 149/83 | HR 89 | Resp 16 | Ht 70.0 in | Wt 215.0 lb

## 2011-10-02 DIAGNOSIS — I10 Essential (primary) hypertension: Secondary | ICD-10-CM

## 2011-10-02 DIAGNOSIS — I491 Atrial premature depolarization: Secondary | ICD-10-CM

## 2011-10-02 DIAGNOSIS — E785 Hyperlipidemia, unspecified: Secondary | ICD-10-CM

## 2011-10-02 DIAGNOSIS — R7301 Impaired fasting glucose: Secondary | ICD-10-CM

## 2011-10-02 NOTE — Assessment & Plan Note (Signed)
Seem to be relatively asymptomatic at this time. Continue observation.

## 2011-10-02 NOTE — Assessment & Plan Note (Signed)
Statin therapy recently intensified by Dr. Lodema Hong.

## 2011-10-02 NOTE — Assessment & Plan Note (Signed)
Blood pressure still not optimally controlled. Home recordings show systolics ranging from the 140s most of the time, as high as the 160s. Heart rate in the 70s to 80s. I reinforced dietary modifications including sodium restriction, also consideration of more regular exercise via walking. No changes were made to his present regimen, labs reviewed as well. If blood pressure trend does not improve, he may require additional modifications, perhaps changing from hydrochlorothiazide to Aldactone, or adding a fourth antihypertensive. Followup will be arranged with Dr. Dietrich Pates.

## 2011-10-02 NOTE — Patient Instructions (Signed)
**Note De-Identified  Obfuscation** Your physician discussed the importance of regular exercise and recommended that you start or continue a regular exercise program for good health.  Your physician recommends that you restrict salt from your diet. Please see handout given to you at today's visit.  Your physician recommends that you schedule a follow-up appointment in: 3 months

## 2011-10-02 NOTE — Progress Notes (Signed)
Clinical Summary Marvin [name] is a 60 y.o.male presenting for a followup visit. He is a patient of Dr. Dietrich Pates, seen by Ms. Geni Bers at the last visit. One focus recently has been management of hypertension, and hydrochlorothiazide was added at the last visit. Dr. Lodema Hong has also been reviewing his glucose control, and he is due to attend a diabetes class later this evening.  Recent followup lab work showed sodium 139, potassium 4.2, BUN 19, creatinine 1.1, total cholesterol 196, triglycerides 65, HDL 50, LDL 133, hemoglobin A1c 6.3. Statin therapy was modified to Pravachol by Dr. Lodema Hong within the last few days.  He reports no significant palpitations or chest pain. Has not been exercising regularly. He discussed a walking regimen, also paying careful attention to his diet and sodium restriction. Hopefully the diabetes mellitus classes will also help with his diet.    Allergies  Allergen Reactions  . Aspirin   . Naprosyn (Naproxen)   . Penicillins     Medication list reviewed.  Past Medical History  Diagnosis Date  . Impaired glucose tolerance   . Obesity   . Gout   . Hyperlipidemia   . Essential hypertension, benign     Social History Marvin [name] reports that he has never smoked. He has never used smokeless tobacco. Marvin [name] reports that he does not drink alcohol.  Review of Systems As outlined above. No orthopnea or PND, no lower extremity edema. Otherwise negative.  Physical Examination Filed Vitals:   10/02/11 0858  BP: 149/83  Pulse: 89  Resp: 16   Overweight male in no acute distress. HEENT: Conjunctival and lids normal, oropharynx with moist mucosa. Neck: Supple, no carotid bruits or elevated JVP. Lungs: Clear to auscultation, nonlabored. Cardiac: Regular rate and rhythm, soft S4, no significant murmur or S3. Abdomen: Soft, nontender, bowel cells present. Skin: Warm and dry. Extremities: No pitting edema.    Problem List and Plan

## 2011-10-02 NOTE — Assessment & Plan Note (Signed)
Followed by Dr. Lodema Hong. Encouraged him to follow through with the diabetes mellitus classes.

## 2011-10-02 NOTE — Progress Notes (Signed)
Clonidine 0.1 mg by mouth twice a day Home BPs BP check in one month

## 2011-10-03 ENCOUNTER — Telehealth: Payer: Self-pay | Admitting: *Deleted

## 2011-10-03 MED ORDER — CLONIDINE HCL 0.1 MG PO TABS: 0.1000 mg | ORAL_TABLET | Freq: Two times a day (BID) | ORAL | Status: DC

## 2011-10-03 NOTE — Telephone Encounter (Signed)
Spoke with patient regarding initiation of clonidine 0.1mg  bid.  Advised him to check BP daily and record, schedule for BP check in 1 month and to bring values with him when he comes.  Pt verbalized understanding and encouraged to call with any questions or concerns.

## 2011-11-03 ENCOUNTER — Other Ambulatory Visit: Payer: Self-pay | Admitting: Physician Assistant

## 2011-11-03 ENCOUNTER — Ambulatory Visit (INDEPENDENT_AMBULATORY_CARE_PROVIDER_SITE_OTHER): Payer: BC Managed Care – PPO

## 2011-11-03 VITALS — BP 131/82 | HR 49 | Wt 211.0 lb

## 2011-11-03 DIAGNOSIS — I10 Essential (primary) hypertension: Secondary | ICD-10-CM

## 2011-11-03 NOTE — Progress Notes (Signed)
Nurse visit completed at this time.  Taking clonidine 0.1 mg bid without adverse effect.  BP wnl.

## 2011-11-09 NOTE — Progress Notes (Signed)
Blood pressure control is excellent at this visit.  Bradycardia is present, but patient is asymptomatic.  Current medical therapy will be continued.  Patient advised to maintain a list of blood pressures at home and bring to his next visit.

## 2011-11-11 NOTE — Progress Notes (Signed)
Reviewed. Blood pressure is better controlled. Continue current regimen. This is a patient of Dr. Dietrich Pates - will forward.

## 2011-11-19 ENCOUNTER — Other Ambulatory Visit: Payer: Self-pay | Admitting: Family Medicine

## 2011-12-19 ENCOUNTER — Other Ambulatory Visit: Payer: Self-pay | Admitting: Family Medicine

## 2012-01-31 LAB — HEPATIC FUNCTION PANEL
ALT: 27 U/L (ref 0–53)
AST: 25 U/L (ref 0–37)
Albumin: 3.8 g/dL (ref 3.5–5.2)
Alkaline Phosphatase: 62 U/L (ref 39–117)
Bilirubin, Direct: 0.1 mg/dL (ref 0.0–0.3)
Indirect Bilirubin: 0.3 mg/dL (ref 0.0–0.9)
Total Bilirubin: 0.4 mg/dL (ref 0.3–1.2)
Total Protein: 6.8 g/dL (ref 6.0–8.3)

## 2012-01-31 LAB — BASIC METABOLIC PANEL
BUN: 19 mg/dL (ref 6–23)
CO2: 28 mEq/L (ref 19–32)
Calcium: 9 mg/dL (ref 8.4–10.5)
Chloride: 102 mEq/L (ref 96–112)
Creat: 1.19 mg/dL (ref 0.50–1.35)
Glucose, Bld: 107 mg/dL — ABNORMAL HIGH (ref 70–99)
Potassium: 4.9 mEq/L (ref 3.5–5.3)
Sodium: 139 mEq/L (ref 135–145)

## 2012-01-31 LAB — LIPID PANEL
Cholesterol: 178 mg/dL (ref 0–200)
HDL: 51 mg/dL (ref >39)
LDL Cholesterol: 118 mg/dL — ABNORMAL HIGH (ref 0–99)
Total CHOL/HDL Ratio: 3.5 Ratio
Triglycerides: 45 mg/dL (ref <150)
VLDL: 9 mg/dL (ref 0–40)

## 2012-01-31 LAB — PSA: PSA: 0.62 ng/mL (ref <=4.00)

## 2012-02-01 LAB — CBC WITH DIFFERENTIAL/PLATELET
Basophils Absolute: 0 K/uL (ref 0.0–0.1)
Basophils Relative: 1 % (ref 0–1)
Eosinophils Absolute: 0.1 K/uL (ref 0.0–0.7)
Eosinophils Relative: 2 % (ref 0–5)
HCT: 42.6 % (ref 39.0–52.0)
Hemoglobin: 14.2 g/dL (ref 13.0–17.0)
Lymphocytes Relative: 51 % — ABNORMAL HIGH (ref 12–46)
Lymphs Abs: 1.9 K/uL (ref 0.7–4.0)
MCH: 28.8 pg (ref 26.0–34.0)
MCHC: 33.3 g/dL (ref 30.0–36.0)
MCV: 86.4 fL (ref 78.0–100.0)
Monocytes Absolute: 0.4 K/uL (ref 0.1–1.0)
Monocytes Relative: 10 % (ref 3–12)
Neutro Abs: 1.3 K/uL — ABNORMAL LOW (ref 1.7–7.7)
Neutrophils Relative %: 37 % — ABNORMAL LOW (ref 43–77)
Platelets: 254 K/uL (ref 150–400)
RBC: 4.93 MIL/uL (ref 4.22–5.81)
RDW: 13.7 % (ref 11.5–15.5)
WBC: 3.7 K/uL — ABNORMAL LOW (ref 4.0–10.5)

## 2012-02-01 LAB — HEMOGLOBIN A1C
Hgb A1c MFr Bld: 6 % — ABNORMAL HIGH (ref <5.7)
Mean Plasma Glucose: 126 mg/dL — ABNORMAL HIGH (ref <117)

## 2012-02-05 ENCOUNTER — Ambulatory Visit (INDEPENDENT_AMBULATORY_CARE_PROVIDER_SITE_OTHER): Payer: BC Managed Care – PPO | Admitting: Adult Health

## 2012-02-05 ENCOUNTER — Ambulatory Visit (INDEPENDENT_AMBULATORY_CARE_PROVIDER_SITE_OTHER): Payer: BC Managed Care – PPO | Admitting: Family Medicine

## 2012-02-05 ENCOUNTER — Encounter: Payer: Self-pay | Admitting: Family Medicine

## 2012-02-05 ENCOUNTER — Encounter: Payer: Self-pay | Admitting: Adult Health

## 2012-02-05 VITALS — BP 130/70 | HR 45 | Resp 18 | Ht 70.5 in | Wt 210.0 lb

## 2012-02-05 DIAGNOSIS — I10 Essential (primary) hypertension: Secondary | ICD-10-CM

## 2012-02-05 DIAGNOSIS — E785 Hyperlipidemia, unspecified: Secondary | ICD-10-CM

## 2012-02-05 DIAGNOSIS — Z0000 Encounter for general adult medical examination without abnormal findings: Secondary | ICD-10-CM

## 2012-02-05 DIAGNOSIS — E739 Lactose intolerance, unspecified: Secondary | ICD-10-CM

## 2012-02-05 DIAGNOSIS — I491 Atrial premature depolarization: Secondary | ICD-10-CM

## 2012-02-05 DIAGNOSIS — I499 Cardiac arrhythmia, unspecified: Secondary | ICD-10-CM

## 2012-02-05 LAB — POC HEMOCCULT BLD/STL (OFFICE/1-CARD/DIAGNOSTIC): Fecal Occult Blood, POC: NEGATIVE

## 2012-02-05 NOTE — Assessment & Plan Note (Signed)
These appear to be benign and are causing no problems for him.

## 2012-02-05 NOTE — Patient Instructions (Signed)
F/u in 5 months and 3 weeks.  Call if you need me before  Congrats on improved blood sugar and blood pressure.  Please work on regular exercise with a weight loss goal of 11 pounds.  Uric acid, fasting lipid, cmp , HBa1C, TSH in 5 month and 3 weeks  No med changes

## 2012-02-05 NOTE — Assessment & Plan Note (Signed)
He has excellent control of his BP at this time. Will not make any changes. He sees Dr Lodema Hong this afternoon and will have blood work drawn in 2 days so that it is fasting. No changes in his medications. Will see him in 8 months and then annually if asymptomatic.

## 2012-02-05 NOTE — Patient Instructions (Signed)
Your physician wants you to follow-up with Dr. Dietrich Pates in 8 months. You will receive a reminder letter in the mail two months in advance. If you don't receive a letter, please call our office to schedule the follow-up appointment.  Your physician recommends that you continue on your current medications as directed. Please refer to the Current Medication list given to you today.

## 2012-02-05 NOTE — Progress Notes (Signed)
   HPI: Marvin [name] is a 61 y/o patient of Dr. Diona Browner that we are seeing for ongoing assessment and treatment of hypertension and irregular HR. He is followed by Dr. Lodema Hong for diabetes and hyperlipidemia. He is without complaint today. No chest pain, palpitations, dizziness, or presyncope. He remains active and wants to lose weight so that he can get off of some of the antihypertensives he is taking.   Allergies  Allergen Reactions  . Aspirin   . Naprosyn (Naproxen)   . Penicillins     Current Outpatient Prescriptions  Medication Sig Dispense Refill  . amLODipine (NORVASC) 10 MG tablet TAKE ONE TABLET BY MOUTH EVERY DAY  90 tablet  0  . benazepril (LOTENSIN) 40 MG tablet TAKE ONE TABLET BY MOUTH EVERY DAY  30 tablet  5  . cloNIDine (CATAPRES) 0.1 MG tablet Take 1 tablet (0.1 mg total) by mouth 2 (two) times daily.  60 tablet  12  . hydrochlorothiazide (HYDRODIURIL) 25 MG tablet TAKE ONE TABLET BY MOUTH EVERY DAY  30 tablet  6  . HYDROcodone-acetaminophen (VICODIN ES) 7.5-750 MG per tablet Take 1 tablet by mouth at bedtime as needed.        . Multiple Vitamin (MULTIVITAMIN) tablet Take 1 tablet by mouth daily.        . pravastatin (PRAVACHOL) 80 MG tablet Take 1 tablet (80 mg total) by mouth daily.  90 tablet  2  . Vitamin D, Ergocalciferol, (DRISDOL) 50000 UNITS CAPS Take 50,000 Units by mouth every 7 (seven) days.          Past Medical History  Diagnosis Date  . Impaired glucose tolerance   . Obesity   . Gout   . Hyperlipidemia   . Essential hypertension, benign     Past Surgical History  Procedure Date  . Right knee arthroscopic surgery   . Colonoscopy     08/09/2004    [id number] of systems complete and found to be negative unless listed above  PHYSICAL EXAM BP 126/72  Pulse 55  Resp 16  Ht 5\' 10"  (1.778 m)  Wt 211 lb (95.709 kg)  BMI 30.28 kg/m2  General: Well developed, well nourished, in no acute distress Head: Eyes PERRLA, No xanthomas.   Normal cephalic  and atramatic  Lungs: Clear bilaterally to auscultation and percussion. Heart: HR irregular, without MRG.  Pulses are 2+ & equal.            No carotid bruit. No JVD.  No abdominal bruits. No femoral bruits. Abdomen: Bowel sounds are positive, abdomen soft and non-tender without masses or                  Hernia's noted. Msk:  Back normal, normal gait. Normal strength and tone for age. Extremities: No clubbing, cyanosis or edema.  DP +1 Neuro: Alert and oriented X 3. Psych:  Good affect, responds appropriately  EKG: SR with frequent PAC's. Rate of 60 bpm.  ASSESSMENT AND PLAN

## 2012-02-14 NOTE — Assessment & Plan Note (Signed)
Controlled, no change in medication  

## 2012-02-14 NOTE — Assessment & Plan Note (Signed)
Improved, though LDL still elevated

## 2012-02-14 NOTE — Progress Notes (Signed)
  Subjective:    Patient ID: Marvin [name], male    DOB: 1951/10/06, 61 y.o.   MRN: [id number]  HPI The PT is here for annual exam  and re-evaluation of chronic medical conditions, medication management and review of any available recent lab and radiology data.  Preventive health is updated, specifically  Cancer screening and Immunization.   Questions or concerns regarding consultations or procedures which the PT has had in the interim are  addressed. The PT denies any adverse reactions to current medications since the last visit.  There are no new concerns.  There are no specific complaints       Review of Systems See HPI Denies recent fever or chills. Denies sinus pressure, nasal congestion, ear pain or sore throat. Denies chest congestion, productive cough or wheezing. Denies chest pains, palpitations and leg swelling Denies abdominal pain, nausea, vomiting,diarrhea or constipation.   Denies dysuria, frequency, hesitancy or incontinence. Denies joint pain, swelling and limitation in mobility. Denies headaches, seizures, numbness, or tingling. Denies depression, anxiety or insomnia. Denies skin break down or rash.        Objective:   Physical Exam Pleasant well nourished male, alert and oriented x 3, in no cardio-pulmonary distress. Afebrile. HEENT No facial trauma or asymetry. Sinuses non tender. EOMI, PERTL, fundoscopic exam is negative for hemorhages or exudates. External ears normal, tympanic membranes clear. Oropharynx moist, no exudate, fair  dentition. Neck: supple, no adenopathy,JVD or thyromegaly.No bruits.  Chest: Clear to ascultation bilaterally.No crackles or wheezes. Non tender to palpation  Breast: No asymetry,no masses. No nipple discharge or inversion. No axillary or supraclavicular adenopathy  Cardiovascular system; Heart sounds normal,  S1 and  S2 ,no S3.  No murmur, or thrill.Irregular rate Apical beat not displaced Peripheral pulses  normal.  Abdomen: Soft, non tender, no organomegaly or masses. No bruits. Bowel sounds normal. No guarding, tenderness or rebound.  Rectal:  No mass. guaiac negative stool. Prostate smooth and firm   Musculoskeletal exam: Full ROM of spine, hips , shoulders and knees. No deformity ,swelling or crepitus noted. No muscle wasting or atrophy.   Neurologic: Cranial nerves 2 to 12 intact. Power, tone ,sensation and reflexes normal throughout. No disturbance in gait. No tremor.  Skin: Intact, no ulceration, erythema , scaling or rash noted. Pigmentation normal throughout  Psych; Normal mood and affect. Judgement and concentration normal         Assessment & Plan:

## 2012-02-14 NOTE — Assessment & Plan Note (Signed)
Improved HBA1c

## 2012-03-18 ENCOUNTER — Other Ambulatory Visit: Payer: Self-pay | Admitting: Family Medicine

## 2012-06-02 ENCOUNTER — Other Ambulatory Visit: Payer: Self-pay | Admitting: Family Medicine

## 2012-06-09 ENCOUNTER — Other Ambulatory Visit: Payer: Self-pay | Admitting: General Surgery

## 2012-07-10 ENCOUNTER — Other Ambulatory Visit: Payer: Self-pay | Admitting: Physician Assistant

## 2012-07-25 LAB — COMPLETE METABOLIC PANEL WITH GFR
ALT: 25 U/L (ref 0–53)
AST: 24 U/L (ref 0–37)
Albumin: 3.9 g/dL (ref 3.5–5.2)
Alkaline Phosphatase: 61 U/L (ref 39–117)
BUN: 20 mg/dL (ref 6–23)
CO2: 31 mEq/L (ref 19–32)
Calcium: 9.5 mg/dL (ref 8.4–10.5)
Chloride: 103 mEq/L (ref 96–112)
Creat: 1.12 mg/dL (ref 0.50–1.35)
GFR, Est African American: 82 mL/min
GFR, Est Non African American: 71 mL/min
Glucose, Bld: 90 mg/dL (ref 70–99)
Potassium: 4.7 mEq/L (ref 3.5–5.3)
Sodium: 139 mEq/L (ref 135–145)
Total Bilirubin: 0.8 mg/dL (ref 0.3–1.2)
Total Protein: 6.8 g/dL (ref 6.0–8.3)

## 2012-07-25 LAB — LIPID PANEL
Cholesterol: 188 mg/dL (ref 0–200)
HDL: 53 mg/dL (ref >39)
LDL Cholesterol: 122 mg/dL — ABNORMAL HIGH (ref 0–99)
Total CHOL/HDL Ratio: 3.5 Ratio
Triglycerides: 66 mg/dL (ref <150)
VLDL: 13 mg/dL (ref 0–40)

## 2012-07-25 LAB — TSH: TSH: 0.699 u[IU]/mL (ref 0.350–4.500)

## 2012-07-25 LAB — HEMOGLOBIN A1C
Hgb A1c MFr Bld: 6 % — ABNORMAL HIGH (ref <5.7)
Mean Plasma Glucose: 126 mg/dL — ABNORMAL HIGH (ref <117)

## 2012-07-25 LAB — URIC ACID: Uric Acid, Serum: 7.6 mg/dL (ref 4.0–7.8)

## 2012-07-26 ENCOUNTER — Encounter: Payer: Self-pay | Admitting: Family Medicine

## 2012-07-26 ENCOUNTER — Ambulatory Visit (INDEPENDENT_AMBULATORY_CARE_PROVIDER_SITE_OTHER): Payer: BC Managed Care – PPO | Admitting: Family Medicine

## 2012-07-26 VITALS — BP 118/70 | HR 84 | Resp 16 | Ht 70.5 in | Wt 203.4 lb

## 2012-07-26 DIAGNOSIS — Z125 Encounter for screening for malignant neoplasm of prostate: Secondary | ICD-10-CM

## 2012-07-26 DIAGNOSIS — M109 Gout, unspecified: Secondary | ICD-10-CM

## 2012-07-26 DIAGNOSIS — I10 Essential (primary) hypertension: Secondary | ICD-10-CM

## 2012-07-26 DIAGNOSIS — R7301 Impaired fasting glucose: Secondary | ICD-10-CM

## 2012-07-26 DIAGNOSIS — R5381 Other malaise: Secondary | ICD-10-CM

## 2012-07-26 DIAGNOSIS — E739 Lactose intolerance, unspecified: Secondary | ICD-10-CM

## 2012-07-26 DIAGNOSIS — E669 Obesity, unspecified: Secondary | ICD-10-CM

## 2012-07-26 DIAGNOSIS — R5383 Other fatigue: Secondary | ICD-10-CM

## 2012-07-26 DIAGNOSIS — E785 Hyperlipidemia, unspecified: Secondary | ICD-10-CM

## 2012-07-26 MED ORDER — PRAVASTATIN SODIUM 80 MG PO TABS: 80.0000 mg | ORAL_TABLET | Freq: Every day | ORAL | Status: DC

## 2012-07-26 MED ORDER — PRAVASTATIN SODIUM 40 MG PO TABS: ORAL_TABLET | ORAL | Status: DC

## 2012-07-26 NOTE — Patient Instructions (Addendum)
Annual exam 02/05/2013 or after.Please call if you need me before  Continue regular exercise and dietary change to facilitate weight loss and improved cholesterol. Your bad cholesterol is too high. I will change the pravachol as discussed based on cost concern.  Fasting CBC, cmp, lipid, TSH, HBA1c and TSH, PSA and uric acid level on 01/31/2013  or after

## 2012-07-30 NOTE — Assessment & Plan Note (Signed)
Unchanged Patient educated about the importance of limiting  Carbohydrate intake , the need to commit to daily physical activity for a minimum of 30 minutes , and to commit weight loss. The fact that changes in all these areas will reduce or eliminate all together the development of diabetes is stressed.    

## 2012-07-30 NOTE — Assessment & Plan Note (Signed)
Uncontrolled, lDL is elevated, no med change , low fat diet discussed and encouraged

## 2012-07-30 NOTE — Progress Notes (Signed)
  Subjective:    Patient ID: Marvin [name], male    DOB: December 04, 1951, 61 y.o.   MRN: [id number]  HPI The PT is here for follow up and re-evaluation of chronic medical conditions, medication management and review of any available recent lab and radiology data.  Preventive health is updated, specifically  Cancer screening and Immunization.   Questions or concerns regarding consultations or procedures which the PT has had in the interim are  addressed. The PT denies any adverse reactions to current medications since the last visit.  There are no new concerns.  There are no specific complaints       Review of Systems See HPI Denies recent fever or chills. Denies sinus pressure, nasal congestion, ear pain or sore throat. Denies chest congestion, productive cough or wheezing. Denies chest pains, palpitations and leg swelling Denies abdominal pain, nausea, vomiting,diarrhea or constipation.   Denies dysuria, frequency, hesitancy or incontinence. Denies joint pain, swelling and limitation in mobility. Denies headaches, seizures, numbness, or tingling. Denies depression, anxiety or insomnia. Denies skin break down or rash.        Objective:   Physical Exam   Patient alert and oriented and in no cardiopulmonary distress.  HEENT: No facial asymmetry, EOMI, no sinus tenderness,  oropharynx pink and moist.  Neck supple no adenopathy.  Chest: Clear to auscultation bilaterally.  CVS: S1, S2 no murmurs, no S3.  ABD: Soft non tender. Bowel sounds normal.  Ext: No edema  MS: Adequate ROM spine, shoulders, hips and knees.  Skin: Intact, no ulcerations or rash noted.  Psych: Good eye contact, normal affect. Memory intact not anxious or depressed appearing.  CNS: CN 2-12 intact, power, tone and sensation normal throughout.      Assessment & Plan:

## 2012-07-30 NOTE — Assessment & Plan Note (Signed)
Controlled, no change in medication DASH diet and commitment to daily physical activity for a minimum of 30 minutes discussed and encouraged, as a part of hypertension management. The importance of attaining a healthy weight is also discussed.  

## 2012-07-30 NOTE — Assessment & Plan Note (Signed)
Improved. Pt applauded on succesful weight loss through lifestyle change, and encouraged to continue same. Weight loss goal set for the next several months.  

## 2012-08-13 ENCOUNTER — Other Ambulatory Visit: Payer: Self-pay | Admitting: Family Medicine

## 2012-09-27 ENCOUNTER — Other Ambulatory Visit: Payer: Self-pay | Admitting: Family Medicine

## 2012-10-15 ENCOUNTER — Other Ambulatory Visit: Payer: Self-pay | Admitting: Cardiology

## 2012-10-25 ENCOUNTER — Encounter: Payer: Self-pay | Admitting: Cardiology

## 2012-10-25 ENCOUNTER — Ambulatory Visit (INDEPENDENT_AMBULATORY_CARE_PROVIDER_SITE_OTHER): Payer: BC Managed Care – PPO | Admitting: Cardiology

## 2012-10-25 VITALS — BP 118/68 | HR 75 | Ht 70.5 in | Wt 203.0 lb

## 2012-10-25 DIAGNOSIS — E785 Hyperlipidemia, unspecified: Secondary | ICD-10-CM

## 2012-10-25 DIAGNOSIS — I491 Atrial premature depolarization: Secondary | ICD-10-CM

## 2012-10-25 DIAGNOSIS — M1990 Unspecified osteoarthritis, unspecified site: Secondary | ICD-10-CM | POA: Insufficient documentation

## 2012-10-25 DIAGNOSIS — R7301 Impaired fasting glucose: Secondary | ICD-10-CM

## 2012-10-25 DIAGNOSIS — I10 Essential (primary) hypertension: Secondary | ICD-10-CM

## 2012-10-25 DIAGNOSIS — M109 Gout, unspecified: Secondary | ICD-10-CM | POA: Insufficient documentation

## 2012-10-25 MED ORDER — PRAVASTATIN SODIUM 80 MG PO TABS: 80.0000 mg | ORAL_TABLET | Freq: Every day | ORAL | Status: DC

## 2012-10-25 NOTE — Assessment & Plan Note (Signed)
Although lipid profile remains suboptimal, patient is currently receiving a maximal dose of pravastatin.  Simvastatin cannot be utilized due to concomitant treatment with amlodipine.  Other more potent statins are cost prohibitive for [name].  Accordingly, we will continue current therapy.

## 2012-10-25 NOTE — Progress Notes (Signed)
Patient ID: Marvin [name], male   DOB: 1951/06/19, 61 y.o.   MRN: [id number]  HPI: Scheduled return visit for this very nice gentleman with hypertension and hyperlipidemia.  Since his last visit, he has done superbly.  He remains active, including in his work in a [employer], without any difficulty.  He notes rare mild palpitations, but these do not bother him significantly.  He has not required urgent medical care nor been hospitalized.  Prior to Admission medications   Medication Sig Start Date End Date Taking? Authorizing Provider  amLODipine (NORVASC) 10 MG tablet TAKE ONE TABLET BY MOUTH EVERY DAY 08/13/12  Yes Kerri Perches, MD  benazepril (LOTENSIN) 40 MG tablet TAKE ONE TABLET BY MOUTH EVERY DAY 09/27/12  Yes Kerri Perches, MD  cloNIDine (CATAPRES) 0.1 MG tablet TAKE ONE TABLET BY MOUTH TWICE DAILY 10/15/12  Yes Kathlen Brunswick, MD  hydrochlorothiazide (HYDRODIURIL) 25 MG tablet TAKE ONE TABLET BY MOUTH EVERY DAY 07/10/12  Yes Jodelle Gross, NP  Multiple Vitamin (MULTIVITAMIN) tablet Take 1 tablet by mouth daily.     Yes Historical Provider, MD  pravastatin (PRAVACHOL) 40 MG tablet Take two tablets at bedtime 07/26/12  Yes Kerri Perches, MD   Allergies  Allergen Reactions  . Aspirin   . Naprosyn (Naproxen)   . Penicillins      Past medical history, social history, and family history reviewed and updated.  ROS: Denies orthopnea, PND, pedal edema, lightheadedness or syncope.  All other systems reviewed and are negative.  PHYSICAL EXAM: BP 118/68  Pulse 75  Ht 5' 10.5" (1.791 m)  Wt 92.08 kg (203 lb)  BMI 28.72 kg/m2  SpO2 98%  General-Well developed; no acute distress Body habitus-Slightly overweight Neck-No JVD; no carotid bruits Lungs-clear lung fields; resonant to percussion; slightly prolonged expiratory phase Cardiovascular-normal PMI; normal S1 and S2; prominent fourth heart sound Abdomen-normal bowel sounds; soft and non-tender without masses or  organomegaly Musculoskeletal-No deformities, no cyanosis or clubbing Neurologic-Normal cranial nerves; symmetric strength and tone Skin-Warm, no significant lesions Extremities-distal pulses intact; Trace edema  Rhythm Strip: Sinus rhythm with frequent PACs  ASSESSMENT AND PLAN:  [place], MD 10/25/2012 2:45 PM

## 2012-10-25 NOTE — Progress Notes (Deleted)
Name: Marvin [name]    DOB: 04/10/1951  Age: 61 y.o.  MR#: [id number]       PCP:  Syliva Overman, MD      Insurance: @PAYORNAME @    MEDICATION LIST REVIEWED DR Lodema Hong INCREASED PRAVACHOL TO 80MG  DAILY 07/26/12 CBC/CMET/LIPID/TSH/AIC ARE ALL DUE FOR DR Lodema Hong ON 01/31/13  CC:    Chief Complaint  Patient presents with  . follow up    VS BP 118/68  Pulse 75  Ht 5' 10.5" (1.791 m)  Wt 203 lb (92.08 kg)  BMI 28.72 kg/m2  SpO2 98%  Weights Current Weight  10/25/12 203 lb (92.08 kg)  07/26/12 203 lb 6.4 oz (92.262 kg)  02/05/12 210 lb (95.255 kg)    Blood Pressure  BP Readings from Last 3 Encounters:  10/25/12 118/68  07/26/12 118/70  02/05/12 130/70     Admit date:  (Not on file) Last encounter with RMR:  10/15/2012   Allergy Allergies  Allergen Reactions  . Aspirin   . Naprosyn (Naproxen)   . Penicillins     Current Outpatient Prescriptions  Medication Sig Dispense Refill  . amLODipine (NORVASC) 10 MG tablet TAKE ONE TABLET BY MOUTH EVERY DAY  30 tablet  3  . benazepril (LOTENSIN) 40 MG tablet TAKE ONE TABLET BY MOUTH EVERY DAY  30 tablet  5  . cloNIDine (CATAPRES) 0.1 MG tablet TAKE ONE TABLET BY MOUTH TWICE DAILY  60 tablet  11  . hydrochlorothiazide (HYDRODIURIL) 25 MG tablet TAKE ONE TABLET BY MOUTH EVERY DAY  30 tablet  3  . Multiple Vitamin (MULTIVITAMIN) tablet Take 1 tablet by mouth daily.        . pravastatin (PRAVACHOL) 40 MG tablet Take two tablets at bedtime  60 tablet  5    Discontinued Meds:    Medications Discontinued During This Encounter  Medication Reason  . HYDROcodone-acetaminophen (VICODIN ES) 7.5-750 MG per tablet Error  . Vitamin D, Ergocalciferol, (DRISDOL) 50000 UNITS CAPS Error    Patient Active Problem List  Diagnosis  . IMPAIRED FASTING GLUCOSE  . Hypertension  . Hyperlipidemia  . Degenerative joint disease  . Premature atrial contractions  . Gout    LABS No visits with results within 3 Month(s) from this  visit. Latest known visit with results is:  Office Visit on 02/05/2012  Component Date Value  . Cholesterol 07/24/2012 188   . Triglycerides 07/24/2012 66   . HDL 07/24/2012 53   . Total CHOL/HDL Ratio 07/24/2012 3.5   . VLDL 07/24/2012 13   . LDL Cholesterol 07/24/2012 122*  . Sodium 07/24/2012 139   . Potassium 07/24/2012 4.7   . Chloride 07/24/2012 103   . CO2 07/24/2012 31   . Glucose, Bld 07/24/2012 90   . BUN 07/24/2012 20   . Creat 07/24/2012 1.12   . Total Bilirubin 07/24/2012 0.8   . Alkaline Phosphatase 07/24/2012 61   . AST 07/24/2012 24   . ALT 07/24/2012 25   . Total Protein 07/24/2012 6.8   . Albumin 07/24/2012 3.9   . Calcium 07/24/2012 9.5   . GFR, Est African American 07/24/2012 82   . GFR, Est Non African Ame* 07/24/2012 71   . Hemoglobin A1C 07/24/2012 6.0*  . Mean Plasma Glucose 07/24/2012 126*  . TSH 07/24/2012 0.699   . Uric Acid, Serum 07/24/2012 7.6   . Fecal Occult Blood, POC 02/05/2012 Negative      Results for this Opt Visit:     Results for orders placed  in visit on 02/05/12  LIPID PANEL      Component Value Range   Cholesterol 188  0 - 200 mg/dL   Triglycerides 66  <213 mg/dL   HDL 53  >08 mg/dL   Total CHOL/HDL Ratio 3.5     VLDL 13  0 - 40 mg/dL   LDL Cholesterol 657 (*) 0 - 99 mg/dL  COMPLETE METABOLIC PANEL WITH GFR      Component Value Range   Sodium 139  135 - 145 mEq/L   Potassium 4.7  3.5 - 5.3 mEq/L   Chloride 103  96 - 112 mEq/L   CO2 31  19 - 32 mEq/L   Glucose, Bld 90  70 - 99 mg/dL   BUN 20  6 - 23 mg/dL   Creat 8.46  9.62 - 9.52 mg/dL   Total Bilirubin 0.8  0.3 - 1.2 mg/dL   Alkaline Phosphatase 61  39 - 117 U/L   AST 24  0 - 37 U/L   ALT 25  0 - 53 U/L   Total Protein 6.8  6.0 - 8.3 g/dL   Albumin 3.9  3.5 - 5.2 g/dL   Calcium 9.5  8.4 - 84.1 mg/dL   GFR, Est African American 82     GFR, Est Non African American 71    HEMOGLOBIN A1C      Component Value Range   Hemoglobin A1C 6.0 (*) <5.7 %   Mean Plasma  Glucose 126 (*) <117 mg/dL  TSH      Component Value Range   TSH 0.699  0.350 - 4.500 uIU/mL  URIC ACID      Component Value Range   Uric Acid, Serum 7.6  4.0 - 7.8 mg/dL  HEMOCCULT (POC) BLOOD/STOOL TEST (OFFICE-1 CARD)      Component Value Range   Fecal Occult Blood, POC Negative      EKG Orders placed in visit on 02/05/12  . EKG 12-LEAD     Prior Assessment and Plan Problem List as of 10/25/2012            Cardiology Problems   Hypertension   Hyperlipidemia   Premature atrial contractions     Other   IMPAIRED FASTING GLUCOSE   Last Assessment & Plan Note   07/26/2012 Office Visit Signed 07/30/2012  2:40 PM by Kerri Perches, MD    Unchanged Patient educated about the importance of limiting  Carbohydrate intake , the need to commit to daily physical activity for a minimum of 30 minutes , and to commit weight loss. The fact that changes in all these areas will reduce or eliminate all together the development of diabetes is stressed.       Degenerative joint disease   Gout       Imaging: No results found.   FRS Calculation: Score not calculated. Missing: Total Cholesterol

## 2012-10-25 NOTE — Assessment & Plan Note (Signed)
PACs persist but do not appear to reflect significant cardiac pathology.

## 2012-10-25 NOTE — Patient Instructions (Addendum)
Your physician recommends that you schedule a follow-up appointment in 1 YEAR.  

## 2012-10-25 NOTE — Assessment & Plan Note (Signed)
Blood pressure control has been excellent over the past year; current therapy will be continued

## 2012-11-01 ENCOUNTER — Other Ambulatory Visit: Payer: Self-pay | Admitting: *Deleted

## 2012-11-01 MED ORDER — PRAVASTATIN SODIUM 40 MG PO TABS: ORAL_TABLET | ORAL | Status: DC

## 2012-11-01 NOTE — Telephone Encounter (Signed)
Received incoming call to clarify pt sig on Pravastatin from pharmacy rep sarah, noted sig as 80mg  tab one daily/2 tablets at bedtime, spoke to T Cheek concerning pt per noted the 40mg  tablet is cheaper for pt therefore pt needs to take 2 (40mg  tablets) at bedtime to equal 80mg  dosage desired, pharmacy rep noted correction and will fill for the pt as verbally advised, placed new refill in chart as a PHONE IN refill, discontinued medication as 80mg  tablet and replaced with 40mg  tablets, use of free text to advise (Take 2 (40mg ) tablets QHS to equal 80mg  daily

## 2012-11-25 ENCOUNTER — Other Ambulatory Visit: Payer: Self-pay | Admitting: Adult Health

## 2012-11-25 NOTE — Telephone Encounter (Signed)
rx sent to pharmacy by e-script Per pt compliance noted in chart 

## 2012-12-29 ENCOUNTER — Other Ambulatory Visit: Payer: Self-pay | Admitting: Family Medicine

## 2013-02-05 ENCOUNTER — Other Ambulatory Visit: Payer: Self-pay | Admitting: Family Medicine

## 2013-02-06 LAB — LIPID PANEL
Cholesterol: 209 mg/dL — ABNORMAL HIGH (ref 0–200)
HDL: 53 mg/dL (ref >39)
LDL Cholesterol: 142 mg/dL — ABNORMAL HIGH (ref 0–99)
Total CHOL/HDL Ratio: 3.9 Ratio
Triglycerides: 71 mg/dL (ref <150)
VLDL: 14 mg/dL (ref 0–40)

## 2013-02-06 LAB — CBC
HCT: 44.8 % (ref 39.0–52.0)
Hemoglobin: 14.5 g/dL (ref 13.0–17.0)
MCH: 28.1 pg (ref 26.0–34.0)
MCHC: 32.4 g/dL (ref 30.0–36.0)
MCV: 86.8 fL (ref 78.0–100.0)
Platelets: 243 10*3/uL (ref 150–400)
RBC: 5.16 MIL/uL (ref 4.22–5.81)
RDW: 14.1 % (ref 11.5–15.5)
WBC: 3.8 10*3/uL — ABNORMAL LOW (ref 4.0–10.5)

## 2013-02-06 LAB — TSH: TSH: 0.523 u[IU]/mL (ref 0.350–4.500)

## 2013-02-06 LAB — COMPREHENSIVE METABOLIC PANEL
ALT: 25 U/L (ref 0–53)
AST: 26 U/L (ref 0–37)
Albumin: 4 g/dL (ref 3.5–5.2)
Alkaline Phosphatase: 62 U/L (ref 39–117)
BUN: 20 mg/dL (ref 6–23)
CO2: 28 mEq/L (ref 19–32)
Calcium: 9.5 mg/dL (ref 8.4–10.5)
Chloride: 102 mEq/L (ref 96–112)
Creat: 1.12 mg/dL (ref 0.50–1.35)
Glucose, Bld: 107 mg/dL — ABNORMAL HIGH (ref 70–99)
Potassium: 4.6 mEq/L (ref 3.5–5.3)
Sodium: 139 mEq/L (ref 135–145)
Total Bilirubin: 0.6 mg/dL (ref 0.3–1.2)
Total Protein: 6.8 g/dL (ref 6.0–8.3)

## 2013-02-06 LAB — URIC ACID: Uric Acid, Serum: 8.7 mg/dL — ABNORMAL HIGH (ref 4.0–7.8)

## 2013-02-07 LAB — PSA: PSA: 0.87 ng/mL (ref <=4.00)

## 2013-02-07 LAB — HEMOGLOBIN A1C
Hgb A1c MFr Bld: 6.4 % — ABNORMAL HIGH (ref <5.7)
Mean Plasma Glucose: 137 mg/dL — ABNORMAL HIGH (ref <117)

## 2013-02-08 ENCOUNTER — Ambulatory Visit (INDEPENDENT_AMBULATORY_CARE_PROVIDER_SITE_OTHER): Payer: BC Managed Care – PPO | Admitting: Family Medicine

## 2013-02-08 DIAGNOSIS — R7301 Impaired fasting glucose: Secondary | ICD-10-CM

## 2013-02-08 DIAGNOSIS — I10 Essential (primary) hypertension: Secondary | ICD-10-CM

## 2013-02-08 DIAGNOSIS — Z0000 Encounter for general adult medical examination without abnormal findings: Secondary | ICD-10-CM

## 2013-02-08 DIAGNOSIS — E785 Hyperlipidemia, unspecified: Secondary | ICD-10-CM

## 2013-02-08 NOTE — Patient Instructions (Addendum)
F/u in 4.5 month, please call if you need me before  Fasting lipid, cmp and EGFR, hBA1C in 4.5 month  Blood sugar and cholesterol have increased and are too high, please work on an 8 to 10 pound weight losss in the next 4.5 month, and eat mainly fruit and vegetable , white makes grilled or baked. Drink water.  Blood pressure is excellent, no hidden blood in stool, which is good  It is important that you exercise regularly at least 30 minutes 5 times a week. If you develop chest pain, have severe difficulty breathing, or feel very tired, stop exercising immediately and seek medical attention    A healthy diet is rich in fruit, vegetables and whole grains. Poultry fish, nuts and beans are a healthy choice for protein rather then red meat. A low sodium diet and drinking 64 ounces of water daily is generally recommended. Oils and sweet should be limited. Carbohydrates especially for those who are diabetic or overweight, should be limited to 30-45 gram per meal. It is important to eat on a regular schedule, at least 3 times daily. Snacks should be primarily fruits, vegetables or nuts.

## 2013-02-16 ENCOUNTER — Encounter: Payer: Self-pay | Admitting: Family Medicine

## 2013-02-16 NOTE — Assessment & Plan Note (Signed)
Annual exam as documented. Pt re educated re the need to focus on reduced carb intake and daily physical activity

## 2013-02-16 NOTE — Assessment & Plan Note (Signed)
Deteriorated. Patient advised to reduce carb and sweets, commit to regular physical activity, take meds as prescribed, test blood as directed, and attempt to lose weight, to improve blood sugar control.f/f/u  F/u in 4.5 month

## 2013-02-16 NOTE — Assessment & Plan Note (Signed)
Controlled, no change in medication DASH diet and commitment to daily physical activity for a minimum of 30 minutes discussed and encouraged, as a part of hypertension management. The importance of attaining a healthy weight is also discussed.  

## 2013-02-16 NOTE — Progress Notes (Signed)
  Subjective:    Patient ID: Marvin [name], male    DOB: [birth year], 62 y.o.   MRN: [id number]  HPI The PT is here for annual exam  and re-evaluation of chronic medical conditions, medication management and review of any available recent lab and radiology data.  Preventive health is updated, specifically  Cancer screening and Immunization.    The PT denies any adverse reactions to current medications since the last visit.  There are no new concerns.  There are no specific complaints       Review of Systems See HPI Denies recent fever or chills. Denies sinus pressure, nasal congestion, ear pain or sore throat. Denies chest congestion, productive cough or wheezing. Denies chest pains, palpitations and leg swelling Denies abdominal pain, nausea, vomiting,diarrhea or constipation.   Denies dysuria, frequency, hesitancy or incontinence. Denies joint pain, swelling and limitation in mobility. Denies headaches, seizures, numbness, or tingling. Denies depression, anxiety or insomnia. Denies skin break down or rash.        Objective:   Physical Exam  Pleasant well nourished male, alert and oriented x 3, in no cardio-pulmonary distress. Afebrile. HEENT No facial trauma or asymetry. Sinuses non tender. EOMI, PERTL, fundoscopic exam is negative for hemorhages or exudates. External ears normal, tympanic membranes clear. Oropharynx moist, no exudate, fair  dentition. Neck: supple, no adenopathy,JVD or thyromegaly.No bruits.  Chest: Clear to ascultation bilaterally.No crackles or wheezes. Non tender to palpation  Breast: No asymetry,no masses. No nipple discharge or inversion. No axillary or supraclavicular adenopathy  Cardiovascular system; Heart sounds normal,  S1 and  S2 ,no S3.  No murmur, or thrill. Apical beat not displaced Peripheral pulses normal.  Abdomen: Soft, non tender, no organomegaly or masses. No bruits. Bowel sounds normal. No guarding, tenderness or  rebound.  Rectal:  No mass. guaiac negative stool. Prostate smooth and firm   Musculoskeletal exam: Full ROM of spine, hips , shoulders and knees. No deformity ,swelling or crepitus noted. No muscle wasting or atrophy.   Neurologic: Cranial nerves 2 to 12 intact. Power, tone ,sensation and reflexes normal throughout. No disturbance in gait. No tremor.  Skin: Intact, no ulceration, erythema , scaling or rash noted. Pigmentation normal throughout  Psych; Normal mood and affect. Judgement and concentration normal        Assessment & Plan:

## 2013-02-16 NOTE — Assessment & Plan Note (Signed)
deteriorated  Hyperlipidemia:Low fat diet discussed and encouraged.  No med change

## 2013-04-17 ENCOUNTER — Other Ambulatory Visit: Payer: Self-pay | Admitting: Family Medicine

## 2013-04-29 ENCOUNTER — Other Ambulatory Visit: Payer: Self-pay | Admitting: Family Medicine

## 2013-06-11 LAB — LIPID PANEL
Cholesterol: 181 mg/dL (ref 0–200)
HDL: 49 mg/dL (ref >39)
LDL Cholesterol: 120 mg/dL — ABNORMAL HIGH (ref 0–99)
Total CHOL/HDL Ratio: 3.7 Ratio
Triglycerides: 58 mg/dL (ref <150)
VLDL: 12 mg/dL (ref 0–40)

## 2013-06-12 LAB — COMPLETE METABOLIC PANEL WITH GFR
ALT: 25 U/L (ref 0–53)
AST: 24 U/L (ref 0–37)
Albumin: 3.9 g/dL (ref 3.5–5.2)
Alkaline Phosphatase: 68 U/L (ref 39–117)
BUN: 17 mg/dL (ref 6–23)
CO2: 28 mEq/L (ref 19–32)
Calcium: 9.4 mg/dL (ref 8.4–10.5)
Chloride: 101 mEq/L (ref 96–112)
Creat: 1.29 mg/dL (ref 0.50–1.35)
GFR, Est African American: 69 mL/min
GFR, Est Non African American: 59 mL/min — ABNORMAL LOW
Glucose, Bld: 96 mg/dL (ref 70–99)
Potassium: 4.2 mEq/L (ref 3.5–5.3)
Sodium: 138 mEq/L (ref 135–145)
Total Bilirubin: 0.9 mg/dL (ref 0.3–1.2)
Total Protein: 6.9 g/dL (ref 6.0–8.3)

## 2013-06-12 LAB — HEMOGLOBIN A1C
Hgb A1c MFr Bld: 5.9 % — ABNORMAL HIGH (ref <5.7)
Mean Plasma Glucose: 123 mg/dL — ABNORMAL HIGH (ref <117)

## 2013-06-15 ENCOUNTER — Ambulatory Visit (INDEPENDENT_AMBULATORY_CARE_PROVIDER_SITE_OTHER): Payer: BC Managed Care – PPO | Admitting: Family Medicine

## 2013-06-15 ENCOUNTER — Encounter: Payer: Self-pay | Admitting: Family Medicine

## 2013-06-15 VITALS — BP 130/70 | HR 74 | Resp 18 | Ht 70.5 in | Wt 198.1 lb

## 2013-06-15 DIAGNOSIS — Z1212 Encounter for screening for malignant neoplasm of rectum: Secondary | ICD-10-CM

## 2013-06-15 DIAGNOSIS — R7309 Other abnormal glucose: Secondary | ICD-10-CM

## 2013-06-15 DIAGNOSIS — E663 Overweight: Secondary | ICD-10-CM

## 2013-06-15 DIAGNOSIS — I10 Essential (primary) hypertension: Secondary | ICD-10-CM

## 2013-06-15 DIAGNOSIS — Z1211 Encounter for screening for malignant neoplasm of colon: Secondary | ICD-10-CM

## 2013-06-15 DIAGNOSIS — R7302 Impaired glucose tolerance (oral): Secondary | ICD-10-CM

## 2013-06-15 DIAGNOSIS — E785 Hyperlipidemia, unspecified: Secondary | ICD-10-CM

## 2013-06-15 DIAGNOSIS — Z6825 Body mass index (BMI) 25.0-25.9, adult: Secondary | ICD-10-CM

## 2013-06-15 MED ORDER — LOVASTATIN 40 MG PO TABS: 40.0000 mg | ORAL_TABLET | Freq: Every day | ORAL | Status: DC

## 2013-06-15 NOTE — Patient Instructions (Addendum)
F/u end November or early December, call if you need me before    Blood pressure and labs are excellent   Fasting lipid, cmp and HBA1C beofore visit  New medication for cholesterol is  lovastatin in place of pravastatin due to cost   Rectal exam today is normal

## 2013-06-26 DIAGNOSIS — E66811 Obesity, class 1: Secondary | ICD-10-CM | POA: Insufficient documentation

## 2013-06-26 DIAGNOSIS — E669 Obesity, unspecified: Secondary | ICD-10-CM | POA: Insufficient documentation

## 2013-06-26 NOTE — Progress Notes (Signed)
  Subjective:    Patient ID: Marvin [name], male    DOB: 1951/11/12, 62 y.o.   MRN: [id number]  HPI The PT is here for follow up and re-evaluation of chronic medical conditions, medication management and review of any available recent lab and radiology data.  Preventive health is updated, specifically  Cancer screening and Immunization.   Questions or concerns regarding consultations or procedures which the PT has had in the interim are  addressed. The PT denies any adverse reactions to current medications since the last visit.  There are no new concerns.  There are no specific complaints       Review of Systems See HPI Denies recent fever or chills. Denies sinus pressure, nasal congestion, ear pain or sore throat. Denies chest congestion, productive cough or wheezing. Denies chest pains, palpitations and leg swelling Denies abdominal pain, nausea, vomiting,diarrhea or constipation.   Denies dysuria, frequency, hesitancy or incontinence. Denies joint pain, swelling and limitation in mobility. Denies headaches, seizures, numbness, or tingling. Denies depression, anxiety or insomnia. Denies skin break down or rash.        Objective:   Physical Exam  Patient alert and oriented and in no cardiopulmonary distress.  HEENT: No facial asymmetry, EOMI, no sinus tenderness,  oropharynx pink and moist.  Neck supple no adenopathy.  Chest: Clear to auscultation bilaterally.  CVS: S1, S2 no murmurs, no S3.  ABD: Soft non tender. Bowel sounds normal. Rectal: no mass, heme negative stool Ext: No edema  MS: Adequate ROM spine, shoulders, hips and knees.  Skin: Intact, no ulcerations or rash noted.  Psych: Good eye contact, normal affect. Memory intact not anxious or depressed appearing.  CNS: CN 2-12 intact, power, tone and sensation normal throughout.       Assessment & Plan:

## 2013-06-26 NOTE — Assessment & Plan Note (Signed)
Improved, need to change med due to cost Hyperlipidemia:Low fat diet discussed and encouraged.

## 2013-06-26 NOTE — Assessment & Plan Note (Signed)
Controlled, no change in medication DASH diet and commitment to daily physical activity for a minimum of 30 minutes discussed and encouraged, as a part of hypertension management. The importance of attaining a healthy weight is also discussed.  

## 2013-06-26 NOTE — Assessment & Plan Note (Signed)
Marked improvement, pt applauded on this  Patient educated about the importance of limiting  Carbohydrate intake , the need to commit to daily physical activity for a minimum of 30 minutes , and to commit weight loss. The fact that changes in all these areas will reduce or eliminate all together the development of diabetes is stressed.

## 2013-06-26 NOTE — Assessment & Plan Note (Signed)
Improved. Pt applauded on succesful weight loss through lifestyle change, and encouraged to continue same. Weight loss goal set for the next several months.  

## 2013-06-27 LAB — POC HEMOCCULT BLD/STL (OFFICE/1-CARD/DIAGNOSTIC): Fecal Occult Blood, POC: NEGATIVE

## 2013-06-27 NOTE — Addendum Note (Signed)
Addended by: Kandis Fantasia B on: 06/27/2013 09:20 AM   Modules accepted: Orders

## 2013-08-24 ENCOUNTER — Other Ambulatory Visit: Payer: Self-pay | Admitting: Family Medicine

## 2013-11-09 ENCOUNTER — Encounter: Payer: Self-pay | Admitting: Cardiology

## 2013-11-09 ENCOUNTER — Ambulatory Visit (INDEPENDENT_AMBULATORY_CARE_PROVIDER_SITE_OTHER): Payer: BC Managed Care – PPO | Admitting: Cardiology

## 2013-11-09 VITALS — BP 144/85 | HR 78 | Ht 70.0 in | Wt 207.0 lb

## 2013-11-09 DIAGNOSIS — E785 Hyperlipidemia, unspecified: Secondary | ICD-10-CM

## 2013-11-09 DIAGNOSIS — I10 Essential (primary) hypertension: Secondary | ICD-10-CM

## 2013-11-09 NOTE — Patient Instructions (Addendum)
Your physician wants you to follow-up in: ONE YEAR You will receive a reminder letter in the mail two months in advance. If you don't receive a letter, please call our office to schedule the follow-up appointment.  

## 2013-11-09 NOTE — Progress Notes (Signed)
Clinical Summary Marvin [name] is a 62 y.o.male former patient of Marvin [name]. This is our first visit together. She was seen for the following medical problems.   1. HTN - does not check regularly - compliant with meds, he has not taken his clonidine yet today.   2. Hyperlipidemia - compliant with lovastatin, denies any significant side effects.  Last lipid panl6/2014: TC 181 TG 58 HDL 49 LDL 120    Past Medical History  Diagnosis Date  . Impaired glucose tolerance   . Hypertension   . Hyperlipidemia   . Degenerative joint disease   . Premature atrial contractions   . Gout   . Hemorrhoids     Marvin [name]     Allergies  Allergen Reactions  . Aspirin   . Naprosyn [Naproxen]   . Penicillins      Current Outpatient Prescriptions  Medication Sig Dispense Refill  . amLODipine (NORVASC) 10 MG tablet TAKE ONE TABLET BY MOUTH ONCE DAILY  30 tablet  3  . benazepril (LOTENSIN) 40 MG tablet TAKE ONE TABLET BY MOUTH ONCE DAILY  30 tablet  3  . cloNIDine (CATAPRES) 0.1 MG tablet TAKE ONE TABLET BY MOUTH TWICE DAILY  60 tablet  11  . hydrochlorothiazide (HYDRODIURIL) 25 MG tablet       . lovastatin (MEVACOR) 40 MG tablet Take 1 tablet (40 mg total) by mouth at bedtime.  90 tablet  1  . Multiple Vitamin (MULTIVITAMIN) tablet Take 1 tablet by mouth daily.         No current facility-administered medications for this visit.     Past Surgical History  Procedure Laterality Date  . Knee arthroscopy      Right  . Colonoscopy  07/2004     Allergies  Allergen Reactions  . Aspirin   . Naprosyn [Naproxen]   . Penicillins       Family History  Problem Relation Age of Onset  . Alzheimer's disease Mother   . Diabetes Father   . Heart failure Father     prostate disease   . Diabetes Brother   . Hypertension Brother   . Cancer Brother      Social History Marvin [name] reports that he has never smoked. He has never used smokeless tobacco. Marvin [name] reports that  he does not drink alcohol.   Review of Systems CONSTITUTIONAL: No weight loss, fever, chills, weakness or fatigue.  HEENT: Eyes: No visual loss, blurred vision, double vision or yellow sclerae.No hearing loss, sneezing, congestion, runny nose or sore throat.  SKIN: No rash or itching.  CARDIOVASCULAR: no chest pain, no palpitations, no orthopnea, no PND RESPIRATORY: No shortness of breath, cough or sputum.  GASTROINTESTINAL: No anorexia, nausea, vomiting or diarrhea. No abdominal pain or blood.  GENITOURINARY: No burning on urination, no polyuria NEUROLOGICAL: No headache, dizziness, syncope, paralysis, ataxia, numbness or tingling in the extremities. No change in bowel or bladder control.  MUSCULOSKELETAL: No muscle, back pain, joint pain or stiffness.  LYMPHATICS: No enlarged nodes. No history of splenectomy.  PSYCHIATRIC: No history of depression or anxiety.  ENDOCRINOLOGIC: No reports of sweating, cold or heat intolerance. No polyuria or polydipsia.  Marland Kitchen   Physical Examination Filed Vitals:   11/09/13 1455  BP: 144/85  Pulse: 78   Filed Weights   11/09/13 1455  Weight: 207 lb (93.895 kg)    Gen: resting comfortably, no acute distress HEENT: no scleral icterus, pupils equal round and reactive, no palptable cervical adenopathy,  CV: RRR, no m/r/g, no JVD, no carotid bruits Resp: Clear to auscultation bilaterally GI: abdomen is soft, non-tender, non-distended, normal bowel sounds, no hepatosplenomegaly MSK: extremities are warm, no edema.  Skin: warm, no rash Neuro:  no focal deficits Psych: appropriate affect   Diagnostic Studies 11/09/13 Clinic EKG: sinus rhythm with PACs in the form of atiral bigeminy,     Assessment and Plan  1. HTN - slightly elevated in clinic, patient has not taken his clonidine yet today - continue current medications  2. Hyperlipidemia - at goal, continue current statin   Follow up 1 year     Marvin [name], M.D., F.A.C.C.

## 2013-11-12 ENCOUNTER — Other Ambulatory Visit: Payer: Self-pay | Admitting: Family Medicine

## 2013-11-12 LAB — COMPREHENSIVE METABOLIC PANEL
ALT: 23 U/L (ref 0–53)
AST: 19 U/L (ref 0–37)
Albumin: 3.9 g/dL (ref 3.5–5.2)
Alkaline Phosphatase: 58 U/L (ref 39–117)
BUN: 15 mg/dL (ref 6–23)
CO2: 30 mEq/L (ref 19–32)
Calcium: 9.3 mg/dL (ref 8.4–10.5)
Chloride: 103 mEq/L (ref 96–112)
Creat: 1.12 mg/dL (ref 0.50–1.35)
Glucose, Bld: 103 mg/dL — ABNORMAL HIGH (ref 70–99)
Potassium: 4.2 mEq/L (ref 3.5–5.3)
Sodium: 139 mEq/L (ref 135–145)
Total Bilirubin: 0.5 mg/dL (ref 0.3–1.2)
Total Protein: 6.8 g/dL (ref 6.0–8.3)

## 2013-11-12 LAB — LIPID PANEL
Cholesterol: 209 mg/dL — ABNORMAL HIGH (ref 0–200)
HDL: 63 mg/dL (ref >39)
LDL Cholesterol: 131 mg/dL — ABNORMAL HIGH (ref 0–99)
Total CHOL/HDL Ratio: 3.3 Ratio
Triglycerides: 77 mg/dL (ref <150)
VLDL: 15 mg/dL (ref 0–40)

## 2013-11-12 LAB — HEMOGLOBIN A1C
Hgb A1c MFr Bld: 6.1 % — ABNORMAL HIGH (ref <5.7)
Mean Plasma Glucose: 128 mg/dL — ABNORMAL HIGH (ref <117)

## 2013-11-14 NOTE — Addendum Note (Signed)
Addended by: Derry Lory A on: 11/14/2013 03:49 PM   Modules accepted: Orders

## 2013-11-15 ENCOUNTER — Encounter: Payer: Self-pay | Admitting: Family Medicine

## 2013-11-15 ENCOUNTER — Ambulatory Visit (INDEPENDENT_AMBULATORY_CARE_PROVIDER_SITE_OTHER): Payer: BC Managed Care – PPO | Admitting: Family Medicine

## 2013-11-15 VITALS — BP 140/80 | HR 64 | Resp 18 | Ht 70.5 in | Wt 207.0 lb

## 2013-11-15 DIAGNOSIS — R7309 Other abnormal glucose: Secondary | ICD-10-CM

## 2013-11-15 DIAGNOSIS — Z125 Encounter for screening for malignant neoplasm of prostate: Secondary | ICD-10-CM

## 2013-11-15 DIAGNOSIS — E663 Overweight: Secondary | ICD-10-CM

## 2013-11-15 DIAGNOSIS — R7302 Impaired glucose tolerance (oral): Secondary | ICD-10-CM

## 2013-11-15 DIAGNOSIS — E785 Hyperlipidemia, unspecified: Secondary | ICD-10-CM

## 2013-11-15 DIAGNOSIS — I10 Essential (primary) hypertension: Secondary | ICD-10-CM

## 2013-11-15 DIAGNOSIS — Z6825 Body mass index (BMI) 25.0-25.9, adult: Secondary | ICD-10-CM

## 2013-11-15 NOTE — Progress Notes (Signed)
   Subjective:    Patient ID: Marvin [name], male    DOB: October 17, 1951, 62 y.o.   MRN: [id number]  HPI The PT is here for follow up and re-evaluation of chronic medical conditions, medication management and review of any available recent lab and radiology data.  Preventive health is updated, specifically  Cancer screening and Immunization.   Questions or concerns regarding consultations or procedures which the PT has had in the interim are  addressed. The PT denies any adverse reactions to current medications since the last visit.  There are no new concerns. Does admit to inconsistency in healthy habits , with recent over indulgence in unhealthy food in keeping with festive season There are no specific complaints       Review of Systems See HPI Denies recent fever or chills. Denies sinus pressure, nasal congestion, ear pain or sore throat. Denies chest congestion, productive cough or wheezing. Denies chest pains, palpitations and leg swelling Denies abdominal pain, nausea, vomiting,diarrhea or constipation.   Denies dysuria, frequency, hesitancy or incontinence. Denies joint pain, swelling and limitation in mobility. Denies headaches, seizures, numbness, or tingling. Denies depression, anxiety or insomnia. Denies skin break down or rash.        Objective:   Physical Exam  Patient alert and oriented and in no cardiopulmonary distress.  HEENT: No facial asymmetry, EOMI, no sinus tenderness,  oropharynx pink and moist.  Neck supple no adenopathy.  Chest: Clear to auscultation bilaterally.  CVS: S1, S2 no murmurs, no S3.  ABD: Soft non tender. Bowel sounds normal.  Ext: No edema  MS: Adequate ROM spine, shoulders, hips and knees.  Skin: Intact, no ulcerations or rash noted.  Psych: Good eye contact, normal affect. Memory intact not anxious or depressed appearing.  CNS: CN 2-12 intact, power, tone and sensation normal throughout.       Assessment & Plan:

## 2013-11-15 NOTE — Patient Instructions (Signed)
F/u in 5.5 months, with rectal, call if you need me before  It is important that you exercise regularly at least 30 minutes 5 times a week. If you develop chest pain, have severe difficulty breathing, or feel very tired, stop exercising immediately and seek medical attention    A healthy diet is rich in fruit, vegetables and whole grains. Poultry fish, nuts and beans are a healthy choice for protein rather then red meat. A low sodium diet and drinking 64 ounces of water daily is generally recommended. Oils and sweet should be limited. Carbohydrates especially for those who are diabetic or overweight, should be limited to 45 to 60 gram per meal. It is important to eat on a regular schedule, at least 3 times daily. Snacks should be primarily fruits, vegetables or nuts.  Weight loss goal of 10 pounds that you gained since the last visit  You will be contacted if you need to resume allopurinol with a call to your cell # [phone] Fasting lipid, cmp, HBA1C, PSa, CBc, TSH in 5.5 month

## 2013-11-16 ENCOUNTER — Other Ambulatory Visit: Payer: Self-pay

## 2013-11-16 LAB — URIC ACID: Uric Acid, Serum: 7 mg/dL (ref 4.0–7.8)

## 2013-11-16 MED ORDER — AMLODIPINE BESYLATE 10 MG PO TABS: ORAL_TABLET | ORAL | Status: DC

## 2013-11-16 MED ORDER — BENAZEPRIL HCL 40 MG PO TABS: ORAL_TABLET | ORAL | Status: DC

## 2013-12-07 ENCOUNTER — Other Ambulatory Visit: Payer: Self-pay | Admitting: Cardiology

## 2013-12-11 NOTE — Assessment & Plan Note (Signed)
Adequate though sub optimal control. No med chane DASH diet and commitment to daily physical activity for a minimum of 30 minutes discussed and encouraged, as a part of hypertension management. The importance of attaining a healthy weight is also discussed.

## 2013-12-11 NOTE — Assessment & Plan Note (Signed)
Unchanged. Patient re-educated about  the importance of commitment to a  minimum of 150 minutes of exercise per week. The importance of healthy food choices with portion control discussed. Encouraged to start a food diary, count calories and to consider  joining a support group. Sample diet sheets offered. Goals set by the patient for the next several months.    

## 2013-12-11 NOTE — Assessment & Plan Note (Signed)
Deteriorated , mainly due to dietary indiscretion per pt report, will address this Hyperlipidemia:Low fat diet discussed and encouraged.  Rept lab for next visit

## 2013-12-11 NOTE — Assessment & Plan Note (Signed)
Deteriorated Patient educated about the importance of limiting  Carbohydrate intake , the need to commit to daily physical activity for a minimum of 30 minutes , and to commit weight loss. The fact that changes in all these areas will reduce or eliminate all together the development of diabetes is stressed.    

## 2014-01-17 ENCOUNTER — Other Ambulatory Visit: Payer: Self-pay | Admitting: Cardiology

## 2014-04-29 LAB — CBC
HCT: 43.3 % (ref 39.0–52.0)
Hemoglobin: 14.3 g/dL (ref 13.0–17.0)
MCH: 28 pg (ref 26.0–34.0)
MCHC: 33 g/dL (ref 30.0–36.0)
MCV: 84.9 fL (ref 78.0–100.0)
Platelets: 249 10*3/uL (ref 150–400)
RBC: 5.1 MIL/uL (ref 4.22–5.81)
RDW: 14.5 % (ref 11.5–15.5)
WBC: 3.4 10*3/uL — ABNORMAL LOW (ref 4.0–10.5)

## 2014-04-29 LAB — COMPREHENSIVE METABOLIC PANEL
ALT: 25 U/L (ref 0–53)
AST: 22 U/L (ref 0–37)
Albumin: 3.7 g/dL (ref 3.5–5.2)
Alkaline Phosphatase: 58 U/L (ref 39–117)
BUN: 17 mg/dL (ref 6–23)
CO2: 29 mEq/L (ref 19–32)
Calcium: 9.2 mg/dL (ref 8.4–10.5)
Chloride: 100 mEq/L (ref 96–112)
Creat: 1.3 mg/dL (ref 0.50–1.35)
Glucose, Bld: 93 mg/dL (ref 70–99)
Potassium: 4.2 mEq/L (ref 3.5–5.3)
Sodium: 136 mEq/L (ref 135–145)
Total Bilirubin: 0.7 mg/dL (ref 0.2–1.2)
Total Protein: 6.5 g/dL (ref 6.0–8.3)

## 2014-04-29 LAB — LIPID PANEL
Cholesterol: 168 mg/dL (ref 0–200)
HDL: 53 mg/dL (ref >39)
LDL Cholesterol: 103 mg/dL — ABNORMAL HIGH (ref 0–99)
Total CHOL/HDL Ratio: 3.2 Ratio
Triglycerides: 60 mg/dL (ref <150)
VLDL: 12 mg/dL (ref 0–40)

## 2014-04-29 LAB — HEMOGLOBIN A1C
Hgb A1c MFr Bld: 6.2 % — ABNORMAL HIGH (ref <5.7)
Mean Plasma Glucose: 131 mg/dL — ABNORMAL HIGH (ref <117)

## 2014-04-29 LAB — TSH: TSH: 0.447 u[IU]/mL (ref 0.350–4.500)

## 2014-05-01 LAB — PSA: PSA: 0.63 ng/mL (ref <=4.00)

## 2014-05-04 ENCOUNTER — Encounter (INDEPENDENT_AMBULATORY_CARE_PROVIDER_SITE_OTHER): Payer: Self-pay

## 2014-05-04 ENCOUNTER — Ambulatory Visit (INDEPENDENT_AMBULATORY_CARE_PROVIDER_SITE_OTHER): Payer: BC Managed Care – PPO | Admitting: Family Medicine

## 2014-05-04 ENCOUNTER — Encounter: Payer: Self-pay | Admitting: Family Medicine

## 2014-05-04 VITALS — BP 124/78 | HR 58 | Resp 16 | Wt 201.4 lb

## 2014-05-04 DIAGNOSIS — Z1211 Encounter for screening for malignant neoplasm of colon: Secondary | ICD-10-CM

## 2014-05-04 DIAGNOSIS — E785 Hyperlipidemia, unspecified: Secondary | ICD-10-CM

## 2014-05-04 DIAGNOSIS — R7302 Impaired glucose tolerance (oral): Secondary | ICD-10-CM

## 2014-05-04 DIAGNOSIS — I491 Atrial premature depolarization: Secondary | ICD-10-CM

## 2014-05-04 DIAGNOSIS — R7309 Other abnormal glucose: Secondary | ICD-10-CM

## 2014-05-04 DIAGNOSIS — I10 Essential (primary) hypertension: Secondary | ICD-10-CM

## 2014-05-04 DIAGNOSIS — M109 Gout, unspecified: Secondary | ICD-10-CM

## 2014-05-04 DIAGNOSIS — E663 Overweight: Secondary | ICD-10-CM

## 2014-05-04 NOTE — Patient Instructions (Signed)
F/U in March, call if you need me before  You are referred for   Colonoscopy ,due in August, please call with name of Doc/facility that you want   Fasting lipid, cmp , hBA1C due mid November, results will be availble to be seen by Dr Harl Bowie who you should follow up wihth in November also    No changes in meds  Please try and get eye exam this year  Please reduce fried food and sweets, cholesterol improved a lot, but "bad cholesterol  " still slightly high  Rectal exam today is normal

## 2014-05-05 LAB — POC HEMOCCULT BLD/STL (OFFICE/1-CARD/DIAGNOSTIC): Fecal Occult Blood, POC: NEGATIVE

## 2014-05-06 NOTE — Assessment & Plan Note (Signed)
Controlled, no change in medication DASH diet and commitment to daily physical activity for a minimum of 30 minutes discussed and encouraged, as a part of hypertension management. The importance of attaining a healthy weight is also discussed.  

## 2014-05-06 NOTE — Assessment & Plan Note (Signed)
Improved but still not at goal. No med change Updated lab needed at/ before next visit.

## 2014-05-06 NOTE — Assessment & Plan Note (Signed)
Asymptomatic, has been off allopurinol for over 6 month

## 2014-05-06 NOTE — Assessment & Plan Note (Signed)
Stable, followed by cardiology annually, next visit due in the Fall

## 2014-05-06 NOTE — Assessment & Plan Note (Signed)
deteriorated Patient educated about the importance of limiting  Carbohydrate intake , the need to commit to daily physical activity for a minimum of 30 minutes , and to commit weight loss. The fact that changes in all these areas will reduce or eliminate all together the development of diabetes is stressed.    

## 2014-05-06 NOTE — Progress Notes (Signed)
   Subjective:    Patient ID: Marvin [name], male    DOB: [birth year], 63 y.o.   MRN: [id number]  HPI The PT is here for follow up and re-evaluation of chronic medical conditions, medication management and review of any available recent lab and radiology data.  Preventive health is updated, specifically  Cancer screening and Immunization. Needs colonoscopy this year, currently researching most cost effective facility based on insurance. No f/h of colon ca, no change in BM   The PT denies any adverse reactions to current medications since the last visit.  There are no new concerns.  There are no specific complaints       Review of Systems See HPI Denies recent fever or chills. Denies sinus pressure, nasal congestion, ear pain or sore throat. Denies chest congestion, productive cough or wheezing. Denies chest pains, palpitations and leg swelling Denies abdominal pain, nausea, vomiting,diarrhea or constipation.   Denies dysuria, frequency, hesitancy or incontinence. Denies joint pain, swelling and limitation in mobility. Denies headaches, seizures, numbness, or tingling. Denies depression, anxiety or insomnia. Denies skin break down or rash.        Objective:   Physical Exam  BP 124/78  Pulse 58  Resp 16  Wt 201 lb 6.4 oz (91.354 kg)  SpO2 98% Patient alert and oriented and in no cardiopulmonary distress.  HEENT: No facial asymmetry, EOMI, no sinus tenderness,  oropharynx pink and moist.  Neck supple no adenopathy.  Chest: Clear to auscultation bilaterally.  CVS: S1, S2 no murmurs, no S3.Irregular heart rate  ABD: Soft non tender. Bowel sounds normal. Rectal: prostate smooth , firm, not enlarged. Heme negative stool Ext: No edema  MS: Adequate ROM spine, shoulders, hips and knees.  Skin: Intact, no ulcerations or rash noted.  Psych: Good eye contact, normal affect. Memory intact not anxious or depressed appearing.  CNS: CN 2-12 intact, power, tone and  sensation normal throughout.       Assessment & Plan:  Hypertension Controlled, no change in medication DASH diet and commitment to daily physical activity for a minimum of 30 minutes discussed and encouraged, as a part of hypertension management. The importance of attaining a healthy weight is also discussed.    Hyperlipidemia Improved but still not at goal. No med change Updated lab needed at/ before next visit.   Overweight (BMI 25.0-29.9) Improved. Pt applauded on succesful weight loss through lifestyle change, and encouraged to continue same. Weight loss goal set for the next several months.   IGT (impaired glucose tolerance) deteriorated Patient educated about the importance of limiting  Carbohydrate intake , the need to commit to daily physical activity for a minimum of 30 minutes , and to commit weight loss. The fact that changes in all these areas will reduce or eliminate all together the development of diabetes is stressed.     Premature atrial contractions Stable, followed by cardiology annually, next visit due in the Fall  Gout  Asymptomatic, has been off allopurinol for over 6 month

## 2014-05-06 NOTE — Assessment & Plan Note (Signed)
Improved. Pt applauded on succesful weight loss through lifestyle change, and encouraged to continue same. Weight loss goal set for the next several months.  

## 2014-08-01 ENCOUNTER — Telehealth: Payer: Self-pay

## 2014-08-01 NOTE — Telephone Encounter (Signed)
Pt needs to be set up for TCS

## 2014-08-11 NOTE — Telephone Encounter (Signed)
Last done 08/09/2004 by Dr. Gala Romney. OK to triage.

## 2014-08-14 NOTE — Telephone Encounter (Signed)
LMOM to call.

## 2014-08-22 ENCOUNTER — Other Ambulatory Visit: Payer: Self-pay

## 2014-08-22 DIAGNOSIS — Z1211 Encounter for screening for malignant neoplasm of colon: Secondary | ICD-10-CM

## 2014-08-24 NOTE — Telephone Encounter (Signed)
Ok to schedule.

## 2014-08-24 NOTE — Telephone Encounter (Signed)
Gastroenterology Pre-Procedure Review  Request Date:  08/22/2014 Requesting Physician: On Recall  Last colonoscopy was 08/09/2004 by Dr. Gala Romney  PATIENT REVIEW QUESTIONS: The patient responded to the following health history questions as indicated:    1. Diabetes Melitis: no 2. Joint replacements in the past 12 months: no 3. Major health problems in the past 3 months: no 4. Has an artificial valve or MVP: no 5. Has a defibrillator: no 6. Has been advised in past to take antibiotics in advance of a procedure like teeth cleaning: no    MEDICATIONS & ALLERGIES:    Patient reports the following regarding taking any blood thinners:   Plavix? no Aspirin? no Coumadin? no  Patient confirms/reports the following medications:  Current Outpatient Prescriptions  Medication Sig Dispense Refill  . amLODipine (NORVASC) 10 MG tablet TAKE ONE TABLET BY MOUTH ONCE DAILY  30 tablet  6  . benazepril (LOTENSIN) 40 MG tablet TAKE ONE TABLET BY MOUTH ONCE DAILY  30 tablet  6  . cloNIDine (CATAPRES) 0.1 MG tablet TAKE ONE TABLET BY MOUTH TWICE DAILY  60 tablet  6  . hydrochlorothiazide (HYDRODIURIL) 25 MG tablet TAKE ONE TABLET BY MOUTH EVERY DAY  30 tablet  11  . lovastatin (MEVACOR) 40 MG tablet Take 1 tablet (40 mg total) by mouth at bedtime.  90 tablet  1  . Multiple Vitamin (MULTIVITAMIN) tablet Take 1 tablet by mouth daily.         No current facility-administered medications for this visit.    Patient confirms/reports the following allergies:  Allergies  Allergen Reactions  . Aspirin   . Naprosyn [Naproxen]   . Penicillins     No orders of the defined types were placed in this encounter.    AUTHORIZATION INFORMATION Primary Insurance:   ID #:   Group #:  Pre-Cert / Auth required: Pre-Cert / Auth #:   Secondary Insurance:  ID #:   Group #:  Pre-Cert / Auth required:  Pre-Cert / Auth #:   SCHEDULE INFORMATION: Procedure has been scheduled as follows:  Date: 09/22/2014           Time:   9:30 AM Location: [facility] Short Stay  This Gastroenterology Pre-Precedure Review Form is being routed to the following provider(s): R. Garfield Cornea, MD

## 2014-08-25 MED ORDER — PEG-KCL-NACL-NASULF-NA ASC-C 100 G PO SOLR: 1.0000 | ORAL | Status: DC

## 2014-08-25 NOTE — Telephone Encounter (Signed)
Rx sent to the pharmacy and instructions mailed to pt.  

## 2014-09-12 ENCOUNTER — Encounter (HOSPITAL_COMMUNITY): Payer: Self-pay | Admitting: Pharmacy Technician

## 2014-09-14 ENCOUNTER — Telehealth: Payer: Self-pay

## 2014-09-14 NOTE — Telephone Encounter (Signed)
I called and LMOM for pt to call to update triage.

## 2014-09-22 ENCOUNTER — Encounter (HOSPITAL_COMMUNITY)
Admission: RE | Disposition: A | Discharge status: Home / Self Care | Payer: Self-pay | Source: Ambulatory Visit | Attending: Internal Medicine

## 2014-09-22 ENCOUNTER — Ambulatory Visit (HOSPITAL_COMMUNITY)
Admission: RE | Admit: 2014-09-22 | Discharge: 2014-09-22 | Disposition: A | Discharge status: Home / Self Care | Payer: BC Managed Care – PPO | Source: Ambulatory Visit | Attending: Internal Medicine | Admitting: Internal Medicine

## 2014-09-22 ENCOUNTER — Encounter (HOSPITAL_COMMUNITY): Payer: Self-pay

## 2014-09-22 DIAGNOSIS — R7302 Impaired glucose tolerance (oral): Secondary | ICD-10-CM | POA: Diagnosis not present

## 2014-09-22 DIAGNOSIS — Z880 Allergy status to penicillin: Secondary | ICD-10-CM | POA: Insufficient documentation

## 2014-09-22 DIAGNOSIS — K5730 Diverticulosis of large intestine without perforation or abscess without bleeding: Secondary | ICD-10-CM | POA: Diagnosis not present

## 2014-09-22 DIAGNOSIS — D120 Benign neoplasm of cecum: Secondary | ICD-10-CM | POA: Diagnosis not present

## 2014-09-22 DIAGNOSIS — I10 Essential (primary) hypertension: Secondary | ICD-10-CM | POA: Diagnosis not present

## 2014-09-22 DIAGNOSIS — M1990 Unspecified osteoarthritis, unspecified site: Secondary | ICD-10-CM | POA: Diagnosis not present

## 2014-09-22 DIAGNOSIS — Z886 Allergy status to analgesic agent status: Secondary | ICD-10-CM | POA: Diagnosis not present

## 2014-09-22 DIAGNOSIS — E785 Hyperlipidemia, unspecified: Secondary | ICD-10-CM | POA: Insufficient documentation

## 2014-09-22 DIAGNOSIS — K635 Polyp of colon: Secondary | ICD-10-CM

## 2014-09-22 DIAGNOSIS — I491 Atrial premature depolarization: Secondary | ICD-10-CM | POA: Diagnosis not present

## 2014-09-22 DIAGNOSIS — Z1211 Encounter for screening for malignant neoplasm of colon: Secondary | ICD-10-CM

## 2014-09-22 DIAGNOSIS — D123 Benign neoplasm of transverse colon: Secondary | ICD-10-CM | POA: Diagnosis not present

## 2014-09-22 DIAGNOSIS — Z86010 Personal history of colonic polyps: Secondary | ICD-10-CM

## 2014-09-22 DIAGNOSIS — M109 Gout, unspecified: Secondary | ICD-10-CM | POA: Diagnosis not present

## 2014-09-22 HISTORY — PX: COLONOSCOPY: SHX5424

## 2014-09-22 SURGERY — COLONOSCOPY
Anesthesia: Moderate Sedation

## 2014-09-22 MED ORDER — SODIUM CHLORIDE 0.9 % IV SOLN
INTRAVENOUS | Status: DC
  Administered 2014-09-22: 09:00:00 via INTRAVENOUS

## 2014-09-22 MED ORDER — ONDANSETRON HCL 4 MG/2ML IJ SOLN
INTRAMUSCULAR | Status: DC
Start: 2014-09-22 — End: 2014-09-22
  Filled 2014-09-22: qty 2

## 2014-09-22 MED ORDER — MIDAZOLAM HCL 5 MG/5ML IJ SOLN
INTRAMUSCULAR | Status: DC | PRN
  Administered 2014-09-22 (×2): 2 mg via INTRAVENOUS

## 2014-09-22 MED ORDER — STERILE WATER FOR IRRIGATION IR SOLN
Status: DC | PRN
  Administered 2014-09-22: 09:00:00

## 2014-09-22 MED ORDER — MEPERIDINE HCL 100 MG/ML IJ SOLN
INTRAMUSCULAR | Status: DC | PRN
  Administered 2014-09-22: 50 mg via INTRAVENOUS
  Administered 2014-09-22: 25 mg via INTRAVENOUS

## 2014-09-22 MED ORDER — ONDANSETRON HCL 4 MG/2ML IJ SOLN
INTRAMUSCULAR | Status: DC | PRN
  Administered 2014-09-22: 4 mg via INTRAVENOUS

## 2014-09-22 MED ORDER — MEPERIDINE HCL 100 MG/ML IJ SOLN
INTRAMUSCULAR | Status: AC
  Filled 2014-09-22: qty 2

## 2014-09-22 MED ORDER — MIDAZOLAM HCL 5 MG/5ML IJ SOLN
INTRAMUSCULAR | Status: DC
Start: 2014-09-22 — End: 2014-09-22
  Filled 2014-09-22: qty 10

## 2014-09-22 NOTE — Op Note (Signed)
[facility] [address] [place], [address]   COLONOSCOPY PROCEDURE REPORT  PATIENT: Marvin [name], Marvin [name]  MR#: [id number] BIRTHDATE: Jun 09, 1951 , 15  yrs. old GENDER: male ENDOSCOPIST: R.  Garfield Cornea, MD FACP [facility] REFERRED QP:[id number] Moshe Cipro, M.D. PROCEDURE DATE:  2014-10-08 PROCEDURE:   Colonoscopy with snare polypectomy INDICATIONS:average risk for colorectal cancer. MEDICATIONS: Versed 4 mg IV and Demerol 75 mg IV in divided doses. Zofran 4 mg IV ASA CLASS:       Class II  CONSENT: The risks, benefits, alternatives and imponderables including but not limited to bleeding, perforation as well as the possibility of a missed lesion have been reviewed.  The potential for biopsy, lesion removal, etc. have also been discussed. Questions have been answered.  All parties agreeable.  Please see the history and physical in the medical record for more information.  DESCRIPTION OF PROCEDURE:   After the risks benefits and alternatives of the procedure were thoroughly explained, informed consent was obtained.  The digital rectal exam      The EC-[id number] ([id number])  endoscope was introduced through the anus and advanced to the   . No adverse events experienced.   The quality of the prep was adequate.  The instrument was then slowly withdrawn as the colon was fully examined.      COLON FINDINGS: A couple of mamillations in the rectum; otherwise, the rectal mucosa appeared normal.  (1) 6 mg pedunculated polyp in the base of the cecum and (1) 4 mm polyp at the splenic flexure; the patient had scattered left-sided diverticula; the remainder of the colonic mucosa appeared normal.  The above-mentioned polyps were cold snared/removed, respectively. .  Withdrawal time=10 minutes 0 seconds.  The scope was withdrawn and the procedure completed. COMPLICATIONS: There were no immediate complications.  ENDOSCOPIC IMPRESSION: Multiple colonic polyps-removed as described  above. Colonic diverticulosis.    RECOMMENDATIONS: Followup on pathology.  eSigned:  R. Garfield Cornea, MD Rosalita Chessman [facility] 10-08-2014 9:50 AM   cc:  CPT CODES: ICD CODES:  The ICD and CPT codes recommended by this software are interpretations from the data that the clinical staff has captured with the software.  The verification of the translation of this report to the ICD and CPT codes and modifiers is the sole responsibility of the health care institution and practicing physician where this report was generated.  [place]. will not be held responsible for the validity of the ICD and CPT codes included on this report.  AMA assumes no liability for data contained or not contained herein. CPT is a Designer, television/film set of the [employer].  PATIENT NAME:  Marvin [name], Marvin [name] MR#: [id number]

## 2014-09-22 NOTE — Discharge Instructions (Addendum)
Colonoscopy Discharge Instructions  Read the instructions outlined below and refer to this sheet in the next few weeks. These discharge instructions provide you with general information on caring for yourself after you leave the hospital. Your doctor may also give you specific instructions. While your treatment has been planned according to the most current medical practices available, unavoidable complications occasionally occur. If you have any problems or questions after discharge, call Dr. Gala Romney at [phone]. ACTIVITY  You may resume your regular activity, but move at a slower pace for the next 24 hours.   Take frequent rest periods for the next 24 hours.   Walking will help get rid of the air and reduce the bloated feeling in your belly (abdomen).   No driving for 24 hours (because of the medicine (anesthesia) used during the test).    Do not sign any important legal documents or operate any machinery for 24 hours (because of the anesthesia used during the test).  NUTRITION  Drink plenty of fluids.   You may resume your normal diet as instructed by your doctor.   Begin with a light meal and progress to your normal diet. Heavy or fried foods are harder to digest and may make you feel sick to your stomach (nauseated).   Avoid alcoholic beverages for 24 hours or as instructed.  MEDICATIONS  You may resume your normal medications unless your doctor tells you otherwise.  WHAT YOU CAN EXPECT TODAY  Some feelings of bloating in the abdomen.   Passage of more gas than usual.   Spotting of blood in your stool or on the toilet paper.  IF YOU HAD POLYPS REMOVED DURING THE COLONOSCOPY:  No aspirin products for 7 days or as instructed.   No alcohol for 7 days or as instructed.   Eat a soft diet for the next 24 hours.  FINDING OUT THE RESULTS OF YOUR TEST Not all test results are available during your visit. If your test results are not back during the visit, make an appointment  with your caregiver to find out the results. Do not assume everything is normal if you have not heard from your caregiver or the medical facility. It is important for you to follow up on all of your test results.  SEEK IMMEDIATE MEDICAL ATTENTION IF:  You have more than a spotting of blood in your stool.   Your belly is swollen (abdominal distention).   You are nauseated or vomiting.   You have a temperature over 101.   You have abdominal pain or discomfort that is severe or gets worse throughout the day.    Polyp and diverticulosis information provided  Further recommendations to follow pending review of pathology  Diverticulosis Diverticulosis is the condition that develops when small pouches (diverticula) form in the wall of your colon. Your colon, or large intestine, is where water is absorbed and stool is formed. The pouches form when the inside layer of your colon pushes through weak spots in the outer layers of your colon. CAUSES  No one knows exactly what causes diverticulosis. RISK FACTORS  Being older than 85. Your risk for this condition increases with age. Diverticulosis is rare in people younger than 40 years. By age 49, almost everyone has it.  Eating a low-fiber diet.  Being frequently constipated.  Being overweight.  Not getting enough exercise.  Smoking.  Taking over-the-counter pain medicines, like aspirin and ibuprofen. SYMPTOMS  Most people with diverticulosis do not have symptoms. DIAGNOSIS  Because diverticulosis  often has no symptoms, health care providers often discover the condition during an exam for other colon problems. In many cases, a health care provider will diagnose diverticulosis while using a flexible scope to examine the colon (colonoscopy). TREATMENT  If you have never developed an infection related to diverticulosis, you may not need treatment. If you have had an infection before, treatment may include:  Eating more fruits, vegetables,  and grains.  Taking a fiber supplement.  Taking a live bacteria supplement (probiotic).  Taking medicine to relax your colon. HOME CARE INSTRUCTIONS   Drink at least 6-8 glasses of water each day to prevent constipation.  Try not to strain when you have a bowel movement.  Keep all follow-up appointments. If you have had an infection before:  Increase the fiber in your diet as directed by your health care provider or dietitian.  Take a dietary fiber supplement if your health care provider approves.  Only take medicines as directed by your health care provider. SEEK MEDICAL CARE IF:   You have abdominal pain.  You have bloating.  You have cramps.  You have not gone to the bathroom in 3 days. SEEK IMMEDIATE MEDICAL CARE IF:   Your pain gets worse.  Yourbloating becomes very bad.  You have a fever or chills, and your symptoms suddenly get worse.  You begin vomiting.  You have bowel movements that are bloody or black. MAKE SURE YOU:  Understand these instructions.  Will watch your condition.  Will get help right away if you are not doing well or get worse. Document Released: 08/28/2004 Document Revised: 12/06/2013 Document Reviewed: 10/26/2013 [facility] Patient Information 2015 [place], [place]. This information is not intended to replace advice given to you by your health care provider. Make sure you discuss any questions you have with your health care provider. Colon Polyps Polyps are lumps of extra tissue growing inside the body. Polyps can grow in the large intestine (colon). Most colon polyps are noncancerous (benign). However, some colon polyps can become cancerous over time. Polyps that are larger than a pea may be harmful. To be safe, caregivers remove and test all polyps. CAUSES  Polyps form when mutations in the genes cause your cells to grow and divide even though no more tissue is needed. RISK FACTORS There are a number of risk factors that can increase  your chances of getting colon polyps. They include:  Being older than 50 years.  Family history of colon polyps or colon cancer.  Long-term colon diseases, such as colitis or Crohn disease.  Being overweight.  Smoking.  Being inactive.  Drinking too much alcohol. SYMPTOMS  Most small polyps do not cause symptoms. If symptoms are present, they may include:  Blood in the stool. The stool may look dark red or black.  Constipation or diarrhea that lasts longer than 1 week. DIAGNOSIS People often do not know they have polyps until their caregiver finds them during a regular checkup. Your caregiver can use 4 tests to check for polyps:  Digital rectal exam. The caregiver wears gloves and feels inside the rectum. This test would find polyps only in the rectum.  Barium enema. The caregiver puts a liquid called barium into your rectum before taking X-rays of your colon. Barium makes your colon look white. Polyps are dark, so they are easy to see in the X-ray pictures.  Sigmoidoscopy. A thin, flexible tube (sigmoidoscope) is placed into your rectum. The sigmoidoscope has a light and tiny camera in it. The  caregiver uses the sigmoidoscope to look at the last third of your colon.  Colonoscopy. This test is like sigmoidoscopy, but the caregiver looks at the entire colon. This is the most common method for finding and removing polyps. TREATMENT  Any polyps will be removed during a sigmoidoscopy or colonoscopy. The polyps are then tested for cancer. PREVENTION  To help lower your risk of getting more colon polyps:  Eat plenty of fruits and vegetables. Avoid eating fatty foods.  Do not smoke.  Avoid drinking alcohol.  Exercise every day.  Lose weight if recommended by your caregiver.  Eat plenty of calcium and folate. Foods that are rich in calcium include milk, cheese, and broccoli. Foods that are rich in folate include chickpeas, kidney beans, and spinach. HOME CARE INSTRUCTIONS Keep  all follow-up appointments as directed by your caregiver. You may need periodic exams to check for polyps. SEEK MEDICAL CARE IF: You notice bleeding during a bowel movement. Document Released: 08/27/2004 Document Revised: 02/23/2012 Document Reviewed: 02/10/2012 [facility] Patient Information 2015 [place], [place]. This information is not intended to replace advice given to you by your health care provider. Make sure you discuss any questions you have with your health care provider.

## 2014-09-22 NOTE — H&P (Signed)
@LOGO @   Primary Care Physician:  Tula Nakayama, MD Primary Gastroenterologist:  Dr. Gala Romney  Pre-Procedure History & Physical: HPI:  Marvin [name] is a 63 y.o. male is here for a screening colonoscopy. No bowel symptoms. Negative colonoscopy 10 years ago. No family history of colon cancer.  Past Medical History  Diagnosis Date  . Impaired glucose tolerance   . Hypertension   . Hyperlipidemia   . Degenerative joint disease   . Premature atrial contractions   . Gout   . Hemorrhoids     Dr. Romona Curls    Past Surgical History  Procedure Laterality Date  . Knee arthroscopy      Right  . Colonoscopy  07/2004    Prior to Admission medications   Medication Sig Start Date End Date Taking? Authorizing Provider  amLODipine (NORVASC) 10 MG tablet TAKE ONE TABLET BY MOUTH ONCE DAILY 11/16/13  Yes Fayrene Helper, MD  benazepril (LOTENSIN) 40 MG tablet TAKE ONE TABLET BY MOUTH ONCE DAILY 11/16/13  Yes Fayrene Helper, MD  cloNIDine (CATAPRES) 0.1 MG tablet TAKE ONE TABLET BY MOUTH TWICE DAILY 01/17/14  Yes Arnoldo Lenis, MD  hydrochlorothiazide (HYDRODIURIL) 25 MG tablet TAKE ONE TABLET BY MOUTH EVERY DAY 12/07/13  Yes Arnoldo Lenis, MD  lovastatin (MEVACOR) 40 MG tablet Take 1 tablet (40 mg total) by mouth at bedtime. 06/15/13 05/05/15 Yes Fayrene Helper, MD  Multiple Vitamin (MULTIVITAMIN) tablet Take 1 tablet by mouth daily.     Yes Historical Provider, MD    Allergies as of 08/22/2014 - Review Complete 08/22/2014  Allergen Reaction Noted  . Aspirin  01/13/2008  . Naprosyn [naproxen]  09/29/2011  . Penicillins  01/13/2008    Family History  Problem Relation Age of Onset  . Alzheimer's disease Mother   . Diabetes Father   . Heart failure Father     prostate disease   . Diabetes Brother   . Hypertension Brother   . Cancer Brother     History   Social History  . Marital Status: Married    Spouse Name: N/A    Number of Children: N/A  . Years of Education:  N/A   Occupational History  . Engineer, manufacturing systems    Social History Main Topics  . Smoking status: Never Smoker   . Smokeless tobacco: Never Used  . Alcohol Use: No  . Drug Use: No  . Sexual Activity: Not on file   Other Topics Concern  . Not on file   Social History Narrative  . No narrative on file    Review of Systems: See HPI, otherwise negative ROS  Physical Exam: BP 148/67  Pulse 66  Temp(Src) 97.8 F (36.6 C) (Oral)  Resp 16  Ht 5\' 10"  (1.778 m)  Wt 190 lb (86.183 kg)  BMI 27.26 kg/m2  SpO2 98% General:   Alert,  Well-developed, well-nourished, pleasant and cooperative in NAD Head:  Normocephalic and atraumatic. Eyes:  Sclera clear, no icterus.   Conjunctiva pink. Ears:  Normal auditory acuity. Nose:  No deformity, discharge,  or lesions. Mouth:  No deformity or lesions, dentition normal. Neck:  Supple; no masses or thyromegaly. Lungs:  Clear throughout to auscultation.   No wheezes, crackles, or rhonchi. No acute distress. Heart:  Regular rate and rhythm; no murmurs, clicks, rubs,  or gallops. Abdomen:  Soft, nontender and nondistended. No masses, hepatosplenomegaly or hernias noted. Normal bowel sounds, without guarding, and without rebound.   Msk:  Symmetrical without gross deformities.  Normal posture. Pulses:  Normal pulses noted. Extremities:  Without clubbing or edema. Neurologic:  Alert and  oriented x4;  grossly normal neurologically. Skin:  Intact without significant lesions or rashes. Cervical Nodes:  No significant cervical adenopathy. Psych:  Alert and cooperative. Normal mood and affect.  Impression/Plan: Marvin [name] is now here to undergo a screening colonoscopy.   Average risk screening examination. Reported negative colonoscopy 10 years ago. Risks, benefits, limitations, imponderables and alternatives regarding colonoscopy have been reviewed with the patient. Questions have been answered. All parties agreeable.     Notice:  This dictation  was prepared with Dragon dictation along with smaller phrase technology. Any transcriptional errors that result from this process are unintentional and may not be corrected upon review.

## 2014-09-26 ENCOUNTER — Encounter (HOSPITAL_COMMUNITY): Payer: Self-pay | Admitting: Internal Medicine

## 2014-10-02 ENCOUNTER — Encounter: Payer: Self-pay | Admitting: Internal Medicine

## 2014-10-21 ENCOUNTER — Other Ambulatory Visit: Payer: Self-pay | Admitting: Family Medicine

## 2014-12-02 ENCOUNTER — Other Ambulatory Visit: Payer: Self-pay | Admitting: Family Medicine

## 2015-02-20 ENCOUNTER — Telehealth: Payer: Self-pay | Admitting: *Deleted

## 2015-02-20 DIAGNOSIS — E785 Hyperlipidemia, unspecified: Secondary | ICD-10-CM

## 2015-02-20 MED ORDER — CLONIDINE HCL 0.1 MG PO TABS: 0.1000 mg | ORAL_TABLET | Freq: Two times a day (BID) | ORAL | Status: DC

## 2015-02-20 MED ORDER — HYDROCHLOROTHIAZIDE 25 MG PO TABS
25.0000 mg | ORAL_TABLET | Freq: Every day | ORAL | Status: DC
Start: 2015-02-20 — End: 2015-03-30

## 2015-02-20 MED ORDER — AMLODIPINE BESYLATE 10 MG PO TABS: 10.0000 mg | ORAL_TABLET | Freq: Every day | ORAL | Status: DC

## 2015-02-20 MED ORDER — LOVASTATIN 40 MG PO TABS: 40.0000 mg | ORAL_TABLET | Freq: Every day | ORAL | Status: DC

## 2015-02-20 MED ORDER — BENAZEPRIL HCL 40 MG PO TABS: ORAL_TABLET | ORAL | Status: DC

## 2015-02-20 NOTE — Telephone Encounter (Signed)
Pt called to make a appt. Pt is scheduled for 03-28-15 at 3:15, pt is requesting a refill on his medications until this appt. Pt stated he now uses [facility] in [place] for his pharmacy. [phone] pt requesting a call back and pt is needing his lab order to be sent to the lab

## 2015-02-20 NOTE — Telephone Encounter (Signed)
Med sent and lab order faxed

## 2015-03-20 ENCOUNTER — Telehealth: Payer: Self-pay | Admitting: Family Medicine

## 2015-03-20 DIAGNOSIS — I10 Essential (primary) hypertension: Secondary | ICD-10-CM

## 2015-03-20 DIAGNOSIS — E785 Hyperlipidemia, unspecified: Secondary | ICD-10-CM

## 2015-03-20 DIAGNOSIS — R7302 Impaired glucose tolerance (oral): Secondary | ICD-10-CM

## 2015-03-20 NOTE — Telephone Encounter (Signed)
Old lab reordered and mailed

## 2015-03-25 LAB — COMPREHENSIVE METABOLIC PANEL
ALT: 28 U/L (ref 0–53)
AST: 22 U/L (ref 0–37)
Albumin: 3.6 g/dL (ref 3.5–5.2)
Alkaline Phosphatase: 65 U/L (ref 39–117)
BUN: 21 mg/dL (ref 6–23)
CO2: 28 mEq/L (ref 19–32)
Calcium: 9 mg/dL (ref 8.4–10.5)
Chloride: 105 mEq/L (ref 96–112)
Creat: 1.15 mg/dL (ref 0.50–1.35)
Glucose, Bld: 103 mg/dL — ABNORMAL HIGH (ref 70–99)
Potassium: 4.1 mEq/L (ref 3.5–5.3)
Sodium: 140 mEq/L (ref 135–145)
Total Bilirubin: 0.5 mg/dL (ref 0.2–1.2)
Total Protein: 6.6 g/dL (ref 6.0–8.3)

## 2015-03-25 LAB — HEMOGLOBIN A1C
Hgb A1c MFr Bld: 6.3 % — ABNORMAL HIGH (ref <5.7)
Mean Plasma Glucose: 134 mg/dL — ABNORMAL HIGH (ref <117)

## 2015-03-25 LAB — LIPID PANEL
Cholesterol: 182 mg/dL (ref 0–200)
HDL: 55 mg/dL (ref >=40)
LDL Cholesterol: 115 mg/dL — ABNORMAL HIGH (ref 0–99)
Total CHOL/HDL Ratio: 3.3 Ratio
Triglycerides: 62 mg/dL (ref <150)
VLDL: 12 mg/dL (ref 0–40)

## 2015-03-28 ENCOUNTER — Ambulatory Visit (INDEPENDENT_AMBULATORY_CARE_PROVIDER_SITE_OTHER): Payer: BLUE CROSS/BLUE SHIELD | Admitting: Family Medicine

## 2015-03-28 ENCOUNTER — Encounter: Payer: Self-pay | Admitting: Family Medicine

## 2015-03-28 VITALS — BP 130/78 | HR 64 | Resp 16 | Ht 70.0 in | Wt 204.0 lb

## 2015-03-28 DIAGNOSIS — Z23 Encounter for immunization: Secondary | ICD-10-CM

## 2015-03-28 DIAGNOSIS — E785 Hyperlipidemia, unspecified: Secondary | ICD-10-CM

## 2015-03-28 DIAGNOSIS — E8881 Metabolic syndrome: Secondary | ICD-10-CM

## 2015-03-28 DIAGNOSIS — R7302 Impaired glucose tolerance (oral): Secondary | ICD-10-CM | POA: Diagnosis not present

## 2015-03-28 DIAGNOSIS — I491 Atrial premature depolarization: Secondary | ICD-10-CM

## 2015-03-28 DIAGNOSIS — Z125 Encounter for screening for malignant neoplasm of prostate: Secondary | ICD-10-CM

## 2015-03-28 DIAGNOSIS — I10 Essential (primary) hypertension: Secondary | ICD-10-CM

## 2015-03-28 DIAGNOSIS — E663 Overweight: Secondary | ICD-10-CM

## 2015-03-28 NOTE — Patient Instructions (Signed)
Annual physical exam in 6 months, call if you need me before  PSa, CBC, fasting lipid, cmp and HBa1C in 6 months  Blood pressure is excellent   Bad cholesterol has increased and blood sugar also, so pleae commit to exercise for 30 mins the 2 days you dont work  Pls eat  Smaller portions of meat and starchy foods like potato, biscuits, rice , bread And a whole lot more of fresh ot frozen vegetables    TDaP today

## 2015-03-30 ENCOUNTER — Other Ambulatory Visit: Payer: Self-pay

## 2015-03-30 MED ORDER — BENAZEPRIL HCL 40 MG PO TABS: ORAL_TABLET | ORAL | Status: DC

## 2015-03-30 MED ORDER — CLONIDINE HCL 0.1 MG PO TABS: 0.1000 mg | ORAL_TABLET | Freq: Two times a day (BID) | ORAL | Status: DC

## 2015-03-30 MED ORDER — AMLODIPINE BESYLATE 10 MG PO TABS: 10.0000 mg | ORAL_TABLET | Freq: Every day | ORAL | Status: DC

## 2015-03-30 MED ORDER — HYDROCHLOROTHIAZIDE 25 MG PO TABS
25.0000 mg | ORAL_TABLET | Freq: Every day | ORAL | Status: DC
Start: 2015-03-30 — End: 2015-09-28

## 2015-04-08 DIAGNOSIS — E8881 Metabolic syndrome: Secondary | ICD-10-CM | POA: Insufficient documentation

## 2015-04-08 DIAGNOSIS — Z23 Encounter for immunization: Secondary | ICD-10-CM | POA: Insufficient documentation

## 2015-04-08 HISTORY — DX: Metabolic syndrome: E88.810

## 2015-04-08 HISTORY — DX: Metabolic syndrome: E88.81

## 2015-04-08 NOTE — Assessment & Plan Note (Signed)
Uncontrolled, no med change Hyperlipidemia:Low fat diet discussed and encouraged.   Lipid Panel  Lab Results  Component Value Date   CHOL 182 03/24/2015   HDL 55 03/24/2015   LDLCALC 115* 03/24/2015   TRIG 62 03/24/2015   CHOLHDL 3.3 03/24/2015

## 2015-04-08 NOTE — Assessment & Plan Note (Signed)
Deteriorated. Patient re-educated about  the importance of commitment to a  minimum of 150 minutes of exercise per week.  The importance of healthy food choices with portion control discussed. Encouraged to start a food diary, count calories and to consider  joining a support group. Sample diet sheets offered. Goals set by the patient for the next several months.   Weight /BMI 03/28/2015 09/22/2014 05/04/2014  WEIGHT 204 lb 190 lb 201 lb 6.4 oz  HEIGHT 5\' 10"  5\' 10"  -  BMI 29.27 kg/m2 27.26 kg/m2 28.48 kg/m2    Current exercise per week 120 minutes, primarily while on the job

## 2015-04-08 NOTE — Assessment & Plan Note (Signed)
Deteriorated Patient educated about the importance of limiting  Carbohydrate intake , the need to commit to daily physical activity for a minimum of 30 minutes , and to commit weight loss. The fact that changes in all these areas will reduce or eliminate all together the development of diabetes is stressed.   Diabetic Labs Latest Ref Rng 03/24/2015 04/29/2014 11/12/2013 06/11/2013 02/05/2013  HbA1c <5.7 % 6.3(H) 6.2(H) 6.1(H) 5.9(H) 6.4(H)  Chol 0 - 200 mg/dL 182 168 209(H) 181 209(H)  HDL >=40 mg/dL 55 53 63 49 53  Calc LDL 0 - 99 mg/dL 115(H) 103(H) 131(H) 120(H) 142(H)  Triglycerides <150 mg/dL 62 60 77 58 71  Creatinine 0.50 - 1.35 mg/dL 1.15 1.30 1.12 1.29 1.12   BP/Weight 03/28/2015 09/22/2014 05/04/2014 11/15/2013 11/09/2013 06/15/2013 31/49/7026  Systolic BP 378 588 502 774 128 786 767  Diastolic BP 78 64 78 80 85 70 68  Wt. (Lbs) 204 190 201.4 207 207 198.12 203  BMI 29.27 27.26 28.48 29.27 29.7 28.02 28.71   No flowsheet data found.

## 2015-04-08 NOTE — Assessment & Plan Note (Signed)
After obtaining informed consent, the vaccine is  administered by LPN.  

## 2015-04-08 NOTE — Assessment & Plan Note (Signed)
The increased risk of cardiovascular disease associated with this diagnosis, and the need to consistently work on lifestyle to change this is discussed. Following  a  heart healthy diet ,commitment to 30 minutes of exercise at least 5 days per week, as well as control of blood sugar and cholesterol , and achieving a healthy weight are all the areas to be addressed .  

## 2015-04-08 NOTE — Progress Notes (Signed)
Marvin [name]     MRN: [id number]      DOB: 1951/06/06   HPI Marvin [name] is here for follow up and re-evaluation of chronic medical conditions, medication management and review of any available recent lab and radiology data.  Preventive health is updated, specifically  Cancer screening and Immunization.   Questions or concerns regarding consultations or procedures which the PT has had in the interim are  addressed. The PT denies any adverse reactions to current medications since the last visit.  There are no new concerns.  There are no specific complaints   ROS Denies recent fever or chills. Denies sinus pressure, nasal congestion, ear pain or sore throat. Denies chest congestion, productive cough or wheezing. Denies chest pains, palpitations and leg swelling Denies abdominal pain, nausea, vomiting,diarrhea or constipation.   Denies dysuria, frequency, hesitancy or incontinence. Denies joint pain, swelling and limitation in mobility. Denies headaches, seizures, numbness, or tingling. Denies depression, anxiety or insomnia. Denies skin break down or rash.   PE  BP 130/78 mmHg  Pulse 64  Resp 16  Ht 5\' 10"  (1.778 m)  Wt 204 lb (92.534 kg)  BMI 29.27 kg/m2  SpO2 98%  Patient alert and oriented and in no cardiopulmonary distress.  HEENT: No facial asymmetry, EOMI,   oropharynx pink and moist.  Neck supple no JVD, no mass.  Chest: Clear to auscultation bilaterally.  CVS: S1, S2 no murmurs, no S3.Regular rate.  ABD: Soft non tender.   Ext: No edema  MS: Adequate ROM spine, shoulders, hips and knees.  Skin: Intact, no ulcerations or rash noted.  Psych: Good eye contact, normal affect. Memory intact not anxious or depressed appearing.  CNS: CN 2-12 intact, power,  normal throughout.no focal deficits noted.   Assessment & Plan   Hypertension Controlled, no change in medication DASH diet and commitment to daily physical activity for a minimum of 30 minutes  discussed and encouraged, as a part of hypertension management. The importance of attaining a healthy weight is also discussed.  BP/Weight 03/28/2015 09/22/2014 05/04/2014 11/15/2013 11/09/2013 06/15/2013 69/62/9528  Systolic BP 413 244 010 272 536 644 034  Diastolic BP 78 64 78 80 85 70 68  Wt. (Lbs) 204 190 201.4 207 207 198.12 203  BMI 29.27 27.26 28.48 29.27 29.7 28.02 28.71         IGT (impaired glucose tolerance) Deteriorated Patient educated about the importance of limiting  Carbohydrate intake , the need to commit to daily physical activity for a minimum of 30 minutes , and to commit weight loss. The fact that changes in all these areas will reduce or eliminate all together the development of diabetes is stressed.   Diabetic Labs Latest Ref Rng 03/24/2015 04/29/2014 11/12/2013 06/11/2013 02/05/2013  HbA1c <5.7 % 6.3(H) 6.2(H) 6.1(H) 5.9(H) 6.4(H)  Chol 0 - 200 mg/dL 182 168 209(H) 181 209(H)  HDL >=40 mg/dL 55 53 63 49 53  Calc LDL 0 - 99 mg/dL 115(H) 103(H) 131(H) 120(H) 142(H)  Triglycerides <150 mg/dL 62 60 77 58 71  Creatinine 0.50 - 1.35 mg/dL 1.15 1.30 1.12 1.29 1.12   BP/Weight 03/28/2015 09/22/2014 05/04/2014 11/15/2013 11/09/2013 06/15/2013 74/25/9563  Systolic BP 875 643 329 518 841 660 630  Diastolic BP 78 64 78 80 85 70 68  Wt. (Lbs) 204 190 201.4 207 207 198.12 203  BMI 29.27 27.26 28.48 29.27 29.7 28.02 28.71   No flowsheet data found.      Hyperlipidemia Uncontrolled, no med change Hyperlipidemia:Low  fat diet discussed and encouraged.   Lipid Panel  Lab Results  Component Value Date   CHOL 182 03/24/2015   HDL 55 03/24/2015   LDLCALC 115* 03/24/2015   TRIG 62 03/24/2015   CHOLHDL 3.3 03/24/2015         Overweight (BMI 25.0-29.9) Deteriorated. Patient re-educated about  the importance of commitment to a  minimum of 150 minutes of exercise per week.  The importance of healthy food choices with portion control discussed. Encouraged to start a food  diary, count calories and to consider  joining a support group. Sample diet sheets offered. Goals set by the patient for the next several months.   Weight /BMI 03/28/2015 09/22/2014 05/04/2014  WEIGHT 204 lb 190 lb 201 lb 6.4 oz  HEIGHT 5\' 10"  5\' 10"  -  BMI 29.27 kg/m2 27.26 kg/m2 28.48 kg/m2    Current exercise per week 120 minutes, primarily while on the job    Metabolic syndrome X The increased risk of cardiovascular disease associated with this diagnosis, and the need to consistently work on lifestyle to change this is discussed. Following  a  heart healthy diet ,commitment to 30 minutes of exercise at least 5 days per week, as well as control of blood sugar and cholesterol , and achieving a healthy weight are all the areas to be addressed .    Premature atrial contractions Asymptomatic , but persists, needs cardiology f/u, he will call and schedule as already established in the clinic   Need for diphtheria-tetanus-pertussis (Tdap) vaccine, adult/adolescent After obtaining informed consent, the vaccine is  administered by LPN.

## 2015-04-08 NOTE — Assessment & Plan Note (Signed)
Controlled, no change in medication DASH diet and commitment to daily physical activity for a minimum of 30 minutes discussed and encouraged, as a part of hypertension management. The importance of attaining a healthy weight is also discussed.  BP/Weight 03/28/2015 09/22/2014 05/04/2014 11/15/2013 11/09/2013 06/15/2013 03/75/4360  Systolic BP 677 034 035 248 185 909 311  Diastolic BP 78 64 78 80 85 70 68  Wt. (Lbs) 204 190 201.4 207 207 198.12 203  BMI 29.27 27.26 28.48 29.27 29.7 28.02 28.71

## 2015-04-08 NOTE — Assessment & Plan Note (Signed)
Asymptomatic , but persists, needs cardiology f/u, he will call and schedule as already established in the clinic

## 2015-09-28 ENCOUNTER — Other Ambulatory Visit: Payer: Self-pay | Admitting: Family Medicine

## 2015-09-29 ENCOUNTER — Other Ambulatory Visit: Payer: Self-pay | Admitting: Family Medicine

## 2015-09-30 LAB — HEMOGLOBIN A1C
Hgb A1c MFr Bld: 6.1 % — ABNORMAL HIGH (ref <5.7)
Mean Plasma Glucose: 128 mg/dL — ABNORMAL HIGH (ref <117)

## 2015-10-01 LAB — COMPREHENSIVE METABOLIC PANEL
ALT: 21 U/L (ref 9–46)
AST: 20 U/L (ref 10–35)
Albumin: 3.9 g/dL (ref 3.6–5.1)
Alkaline Phosphatase: 64 U/L (ref 40–115)
BUN: 16 mg/dL (ref 7–25)
CO2: 28 mmol/L (ref 20–31)
Calcium: 9.3 mg/dL (ref 8.6–10.3)
Chloride: 103 mmol/L (ref 98–110)
Creat: 1.08 mg/dL (ref 0.70–1.25)
Glucose, Bld: 102 mg/dL — ABNORMAL HIGH (ref 65–99)
Potassium: 4.2 mmol/L (ref 3.5–5.3)
Sodium: 139 mmol/L (ref 135–146)
Total Bilirubin: 0.4 mg/dL (ref 0.2–1.2)
Total Protein: 6.8 g/dL (ref 6.1–8.1)

## 2015-10-01 LAB — BASIC METABOLIC PANEL WITH GFR
BUN: 16 mg/dL (ref 7–25)
CO2: 28 mmol/L (ref 20–31)
Calcium: 9.3 mg/dL (ref 8.6–10.3)
Chloride: 103 mmol/L (ref 98–110)
Creat: 1.08 mg/dL (ref 0.70–1.25)
GFR, Est African American: 83 mL/min (ref >=60)
GFR, Est Non African American: 72 mL/min (ref >=60)
Glucose, Bld: 102 mg/dL — ABNORMAL HIGH (ref 65–99)
Potassium: 4.2 mmol/L (ref 3.5–5.3)
Sodium: 139 mmol/L (ref 135–146)

## 2015-10-01 LAB — LIPID PANEL
Cholesterol: 219 mg/dL — ABNORMAL HIGH (ref 125–200)
HDL: 68 mg/dL (ref >=40)
LDL Cholesterol: 140 mg/dL — ABNORMAL HIGH (ref <130)
Total CHOL/HDL Ratio: 3.2 Ratio (ref <=5.0)
Triglycerides: 56 mg/dL (ref <150)
VLDL: 11 mg/dL (ref <30)

## 2015-10-01 LAB — PSA: PSA: 0.87 ng/mL (ref <=4.00)

## 2015-10-04 ENCOUNTER — Encounter: Payer: Self-pay | Admitting: Family Medicine

## 2015-10-04 ENCOUNTER — Ambulatory Visit (INDEPENDENT_AMBULATORY_CARE_PROVIDER_SITE_OTHER): Payer: BLUE CROSS/BLUE SHIELD | Admitting: Family Medicine

## 2015-10-04 VITALS — BP 114/74 | HR 58 | Resp 16 | Ht 69.0 in | Wt 192.0 lb

## 2015-10-04 DIAGNOSIS — I10 Essential (primary) hypertension: Secondary | ICD-10-CM | POA: Diagnosis not present

## 2015-10-04 DIAGNOSIS — Z1211 Encounter for screening for malignant neoplasm of colon: Secondary | ICD-10-CM | POA: Diagnosis not present

## 2015-10-04 DIAGNOSIS — R7302 Impaired glucose tolerance (oral): Secondary | ICD-10-CM

## 2015-10-04 DIAGNOSIS — E785 Hyperlipidemia, unspecified: Secondary | ICD-10-CM | POA: Diagnosis not present

## 2015-10-04 DIAGNOSIS — Z0000 Encounter for general adult medical examination without abnormal findings: Secondary | ICD-10-CM | POA: Diagnosis not present

## 2015-10-04 LAB — POC HEMOCCULT BLD/STL (OFFICE/1-CARD/DIAGNOSTIC): Fecal Occult Blood, POC: NEGATIVE

## 2015-10-04 NOTE — Patient Instructions (Addendum)
F/u in 6 month, call if you need me beforw  Congrats on improved diet and weight loss  We will call with results  Check ins re coverage for hIV and hep C screen plsease   Fasting lipid, cmp and eGFR in 5.5 month and HBA1C

## 2015-10-04 NOTE — Assessment & Plan Note (Signed)

## 2015-10-04 NOTE — Progress Notes (Signed)
   Subjective:    Patient ID: Marvin [name], male    DOB: Jan 14, 1951, 64 y.o.   MRN: [id number]  HPI Patient is in for annual physical exam. No other health concerns are expressed or addressed at the visit. Recent labs,  are reviewed.No changes in medication, blood sugar has improved, cholesterol deteriorated, low fat diet discussed and he is to follow more closely. Rept in 5.5 months Immunization is reviewed , and  updated if needed.    Review of Systems See HPI     Objective:   Physical Exam BP 114/74 mmHg  Pulse 58  Resp 16  Ht 5\' 9"  (1.753 m)  Wt 192 lb (87.091 kg)  BMI 28.34 kg/m2  SpO2 99%   Pleasant well nourished male, alert and oriented x 3, in no cardio-pulmonary distress. Afebrile. HEENT No facial trauma or asymetry. Sinuses non tender. EOMI, PERTL, fundoscopic exam is negative for hemorhages or exudates. External ears normal, tympanic membranes clear. Oropharynx moist, no exudate, fairly good dentition. Neck: supple, no adenopathy,JVD or thyromegaly.No bruits.  Chest: Clear to ascultation bilaterally.No crackles or wheezes. Non tender to palpation  Breast: No asymetry,no masses. No nipple discharge or inversion. No axillary or supraclavicular adenopathy  Cardiovascular system; Heart sounds normal,  S1 and  S2 ,no S3.  No murmur, or thrill.Irregular rate Apical beat not displaced Peripheral pulses normal.  Abdomen: Soft, non tender, no organomegaly or masses. No bruits. Bowel sounds normal. No guarding, tenderness or rebound.  Rectal:  Normal sphincter tone. No hemorrhoids or  masses. guaiac negative stool. Prostate smooth and firm  GU: Not examined  Musculoskeletal exam: Full ROM of spine, hips , shoulders and knees. No deformity ,swelling or crepitus noted. No muscle wasting or atrophy.   Neurologic: Cranial nerves 2 to 12 intact. Power, tone ,sensation and reflexes normal throughout. No disturbance in gait. No  tremor.  Skin: Intact, no ulceration, erythema , scaling or rash noted. Pigmentation normal throughout  Psych; Normal mood and affect. Judgement and concentration normal         Assessment & Plan:   Annual physical exam Annual exam as documented. Counseling done  re healthy lifestyle involving commitment to 150 minutes exercise per week, heart healthy diet, and attaining healthy weight.The importance of adequate sleep also discussed. Regular seat belt use and home safety, is also discussed. Changes in health habits are decided on by the patient with goals and time frames  set for achieving them. Immunization and cancer screening needs are specifically addressed at this visit.

## 2015-10-16 ENCOUNTER — Encounter: Payer: Self-pay | Admitting: Family Medicine

## 2016-02-17 ENCOUNTER — Other Ambulatory Visit: Payer: Self-pay | Admitting: Family Medicine

## 2016-04-02 ENCOUNTER — Ambulatory Visit: Payer: BLUE CROSS/BLUE SHIELD | Admitting: Family Medicine

## 2016-05-29 ENCOUNTER — Encounter: Payer: Self-pay | Admitting: Family Medicine

## 2016-05-29 ENCOUNTER — Ambulatory Visit (INDEPENDENT_AMBULATORY_CARE_PROVIDER_SITE_OTHER): Payer: BLUE CROSS/BLUE SHIELD | Admitting: Family Medicine

## 2016-05-29 VITALS — BP 160/80 | HR 80 | Resp 16 | Ht 69.0 in | Wt 203.0 lb

## 2016-05-29 DIAGNOSIS — M1610 Unilateral primary osteoarthritis, unspecified hip: Secondary | ICD-10-CM

## 2016-05-29 DIAGNOSIS — Z1159 Encounter for screening for other viral diseases: Secondary | ICD-10-CM

## 2016-05-29 DIAGNOSIS — R7302 Impaired glucose tolerance (oral): Secondary | ICD-10-CM | POA: Diagnosis not present

## 2016-05-29 DIAGNOSIS — M7918 Myalgia, other site: Secondary | ICD-10-CM

## 2016-05-29 DIAGNOSIS — Z1321 Encounter for screening for nutritional disorder: Secondary | ICD-10-CM

## 2016-05-29 DIAGNOSIS — E8881 Metabolic syndrome: Secondary | ICD-10-CM

## 2016-05-29 DIAGNOSIS — M791 Myalgia: Secondary | ICD-10-CM

## 2016-05-29 DIAGNOSIS — E663 Overweight: Secondary | ICD-10-CM

## 2016-05-29 DIAGNOSIS — E785 Hyperlipidemia, unspecified: Secondary | ICD-10-CM | POA: Diagnosis not present

## 2016-05-29 DIAGNOSIS — I10 Essential (primary) hypertension: Secondary | ICD-10-CM

## 2016-05-29 DIAGNOSIS — Z114 Encounter for screening for human immunodeficiency virus [HIV]: Secondary | ICD-10-CM

## 2016-05-29 MED ORDER — TRAMADOL HCL 50 MG PO TABS: 50.0000 mg | ORAL_TABLET | Freq: Three times a day (TID) | ORAL | Status: DC | PRN

## 2016-05-29 MED ORDER — PREDNISONE 5 MG PO TABS: 5.0000 mg | ORAL_TABLET | Freq: Two times a day (BID) | ORAL | Status: AC

## 2016-05-29 MED ORDER — LOVASTATIN 40 MG PO TABS: 40.0000 mg | ORAL_TABLET | Freq: Every day | ORAL | Status: DC

## 2016-05-29 MED ORDER — HYDROCHLOROTHIAZIDE 25 MG PO TABS: 25.0000 mg | ORAL_TABLET | Freq: Every day | ORAL | Status: DC

## 2016-05-29 MED ORDER — BENAZEPRIL HCL 40 MG PO TABS: 40.0000 mg | ORAL_TABLET | Freq: Every day | ORAL | Status: DC

## 2016-05-29 MED ORDER — CLONIDINE HCL 0.1 MG PO TABS: 0.1000 mg | ORAL_TABLET | Freq: Two times a day (BID) | ORAL | Status: DC

## 2016-05-29 MED ORDER — AMLODIPINE BESYLATE 10 MG PO TABS: 10.0000 mg | ORAL_TABLET | Freq: Every day | ORAL | Status: DC

## 2016-05-29 NOTE — Progress Notes (Signed)
   Subjective:    Patient ID: Marvin [name], male    DOB: [birth year], 65 y.o.   MRN: [id number]  HPI    Review of Systems     Objective:   Physical Exam        Assessment & Plan:

## 2016-05-29 NOTE — Patient Instructions (Addendum)
F/U  In 2.5 month, call if you need me before  IMPORTANT, VERY to take all medications as prescribed  Blood pressure is too high  Fasting labs as soon as possible  Medication sent for  Buttock pain, tramadol and prednisone  Work on things to stimulate your memory  Thank you  for choosing [facility]. We consider it a privelige to serve you.  Delivering excellent health care in a caring and  compassionate way is our goal.  Partnering with you,  so that together we can achieve this goal is our strategy.

## 2016-05-31 LAB — CBC
HCT: 42.8 % (ref 38.5–50.0)
Hemoglobin: 14.2 g/dL (ref 13.2–17.1)
MCH: 28.9 pg (ref 27.0–33.0)
MCHC: 33.2 g/dL (ref 32.0–36.0)
MCV: 87.2 fL (ref 80.0–100.0)
MPV: 9.8 fL (ref 7.5–12.5)
Platelets: 265 10*3/uL (ref 140–400)
RBC: 4.91 MIL/uL (ref 4.20–5.80)
RDW: 14.1 % (ref 11.0–15.0)
WBC: 3.5 10*3/uL — ABNORMAL LOW (ref 3.8–10.8)

## 2016-05-31 LAB — TSH: TSH: 0.67 mIU/L (ref 0.40–4.50)

## 2016-06-01 LAB — COMPLETE METABOLIC PANEL WITH GFR
ALT: 22 U/L (ref 9–46)
AST: 21 U/L (ref 10–35)
Albumin: 3.7 g/dL (ref 3.6–5.1)
Alkaline Phosphatase: 73 U/L (ref 40–115)
BUN: 17 mg/dL (ref 7–25)
CO2: 25 mmol/L (ref 20–31)
Calcium: 9.2 mg/dL (ref 8.6–10.3)
Chloride: 107 mmol/L (ref 98–110)
Creat: 1.1 mg/dL (ref 0.70–1.25)
GFR, Est African American: 81 mL/min (ref >=60)
GFR, Est Non African American: 70 mL/min (ref >=60)
Glucose, Bld: 99 mg/dL (ref 65–99)
Potassium: 4.4 mmol/L (ref 3.5–5.3)
Sodium: 142 mmol/L (ref 135–146)
Total Bilirubin: 0.5 mg/dL (ref 0.2–1.2)
Total Protein: 6.7 g/dL (ref 6.1–8.1)

## 2016-06-01 LAB — HEPATITIS C ANTIBODY: HCV Ab: NEGATIVE

## 2016-06-01 LAB — HIV ANTIBODY (ROUTINE TESTING W REFLEX): HIV 1&2 Ab, 4th Generation: NONREACTIVE

## 2016-06-01 LAB — HEMOGLOBIN A1C
Hgb A1c MFr Bld: 6.2 % — ABNORMAL HIGH (ref <5.7)
Mean Plasma Glucose: 131 mg/dL

## 2016-06-01 LAB — LIPID PANEL
Cholesterol: 230 mg/dL — ABNORMAL HIGH (ref 125–200)
HDL: 68 mg/dL (ref >=40)
LDL Cholesterol: 152 mg/dL — ABNORMAL HIGH (ref <130)
Total CHOL/HDL Ratio: 3.4 Ratio (ref <=5.0)
Triglycerides: 49 mg/dL (ref <150)
VLDL: 10 mg/dL (ref <30)

## 2016-06-02 LAB — VITAMIN D 25 HYDROXY (VIT D DEFICIENCY, FRACTURES): Vit D, 25-Hydroxy: 20 ng/mL — ABNORMAL LOW (ref 30–100)

## 2016-06-03 DIAGNOSIS — M7918 Myalgia, other site: Secondary | ICD-10-CM | POA: Insufficient documentation

## 2016-06-03 NOTE — Assessment & Plan Note (Signed)
Patient educated about the importance of limiting  Carbohydrate intake , the need to commit to daily physical activity for a minimum of 30 minutes , and to commit weight loss. The fact that changes in all these areas will reduce or eliminate all together the development of diabetes is stressed.  Unchanged   Diabetic Labs Latest Ref Rng 05/29/2016 09/29/2015 09/29/2015 03/24/2015 04/29/2014  HbA1c <5.7 % 6.2(H) - 6.1(H) 6.3(H) 6.2(H)  Chol 125 - 200 mg/dL 230(H) - 219(H) 182 168  HDL >=40 mg/dL 68 - 68 55 53  Calc LDL <130 mg/dL 152(H) - 140(H) 115(H) 103(H)  Triglycerides <150 mg/dL 49 - 56 62 60  Creatinine 0.70 - 1.25 mg/dL 1.10 1.08 1.08 1.15 1.30   BP/Weight 05/29/2016 10/04/2015 03/28/2015 09/22/2014 05/04/2014 11/15/2013 [id number]  Systolic BP 0000000 [id number]  Diastolic BP 80 74 78 64 78 80 85  Wt. (Lbs) 203 192 204 190 201.4 207 207  BMI 29.96 28.34 29.27 27.26 28.48 29.27 29.7   No flowsheet data found.

## 2016-06-03 NOTE — Progress Notes (Signed)
Marvin [name]     MRN: [id number]      DOB: 22-Mar-1951   HPI Marvin [name] is here for follow up and re-evaluation of chronic medical conditions, medication management and review of any available recent lab and radiology data.  Preventive health is updated, specifically  Cancer screening and Immunization.   Questions or concerns regarding consultations or procedures which the PT has had in the interim are  addressed. The PT denies any adverse reactions to current medications since the last visit.  Has not brought his medication and it is very unclear as to what he is actually taking 5 day h/o buttock pain, no trauma Accompanied by his wife who is concerned about his memory, states dementia is in the family , she is concerned ROS Denies recent fever or chills. Denies sinus pressure, nasal congestion, ear pain or sore throat. Denies chest congestion, productive cough or wheezing. Denies chest pains, palpitations and leg swelling Denies abdominal pain, nausea, vomiting,diarrhea or constipation.   Denies dysuria, frequency, hesitancy or incontinence. . Denies headaches, seizures, numbness, or tingling. Denies depression, anxiety or insomnia. Denies skin break down or rash.   PE  BP 160/80 mmHg  Pulse 80  Resp 16  Ht 5\' 9"  (1.753 m)  Wt 203 lb (92.08 kg)  BMI 29.96 kg/m2  SpO2 99%  Patient alert and oriented and in no cardiopulmonary distress.  HEENT: No facial asymmetry, EOMI,   oropharynx pink and moist.  Neck supple no JVD, no mass.  Chest: Clear to auscultation bilaterally.  CVS: S1, S2 no murmurs, no S3.Regular rate.  ABD: Soft non tender.   Ext: No edema  MS: Adequate ROM spine, shoulders, hips and knees.Localized tenderness with no skin breakdown over right buttock Skin: Intact, no ulcerations or rash noted.  Psych: Good eye contact, normal affect. Memory intact not anxious or depressed appearing.  CNS: CN 2-12 intact, power,  normal throughout.no focal deficits  noted.   Assessment & Plan   Hypertension Uncontrolled, non compliant with medication, scripts sent DASH diet and commitment to daily physical activity for a minimum of 30 minutes discussed and encouraged, as a part of hypertension management. The importance of attaining a healthy weight is also discussed.  BP/Weight 05/29/2016 10/04/2015 03/28/2015 09/22/2014 05/04/2014 11/15/2013 [id number]  Systolic BP 0000000 [id number] [id number] [id number]  Diastolic BP 80 74 78 64 78 80 85  Wt. (Lbs) 203 192 204 190 201.4 207 207  BMI 29.96 28.34 29.27 27.26 28.48 29.27 29.7        Hyperlipidemia Deteriorated Hyperlipidemia:Low fat diet discussed and encouraged.   Lipid Panel  Lab Results  Component Value Date   CHOL 230* 05/29/2016   HDL 68 05/29/2016   LDLCALC 152* 05/29/2016   TRIG 49 05/29/2016   CHOLHDL 3.4 05/29/2016   Not at goal , need to verify current meds and address appropriately     IGT (impaired glucose tolerance) Patient educated about the importance of limiting  Carbohydrate intake , the need to commit to daily physical activity for a minimum of 30 minutes , and to commit weight loss. The fact that changes in all these areas will reduce or eliminate all together the development of diabetes is stressed.  Unchanged   Diabetic Labs Latest Ref Rng 05/29/2016 09/29/2015 09/29/2015 03/24/2015 04/29/2014  HbA1c <5.7 % 6.2(H) - 6.1(H) 6.3(H) 6.2(H)  Chol 125 - 200 mg/dL 230(H) - 219(H) 182 168  HDL >=40 mg/dL 68 - 68 55 53  Calc LDL <  130 mg/dL 152(H) - 140(H) 115(H) 103(H)  Triglycerides <150 mg/dL 49 - 56 62 60  Creatinine 0.70 - 1.25 mg/dL 1.10 1.08 1.08 1.15 1.30   BP/Weight 05/29/2016 10/04/2015 03/28/2015 09/22/2014 05/04/2014 11/15/2013 [id number]  Systolic BP 0000000 [id number] [id number]  Diastolic BP 80 74 78 64 78 80 85  Wt. (Lbs) 203 192 204 190 201.4 207 207  BMI 29.96 28.34 29.27 27.26 28.48 29.27 29.7   No flowsheet data found.     Metabolic syndrome X The increased  risk of cardiovascular disease associated with this diagnosis, and the need to consistently work on lifestyle to change this is discussed. Following  a  heart healthy diet ,commitment to 30 minutes of exercise at least 5 days per week, as well as control of blood sugar and cholesterol , and achieving a healthy weight are all the areas to be addressed .   Overweight (BMI 25.0-29.9) Deteriorated. Patient re-educated about  the importance of commitment to a  minimum of 150 minutes of exercise per week.  The importance of healthy food choices with portion control discussed. Encouraged to start a food diary, count calories and to consider  joining a support group. Sample diet sheets offered. Goals set by the patient for the next several months.   Weight /BMI 05/29/2016 10/04/2015 03/28/2015  WEIGHT 203 lb 192 lb 204 lb  HEIGHT 5\' 9"  5\' 9"  5\' 10"   BMI 29.96 kg/m2 28.34 kg/m2 29.27 kg/m2    Current exercise per week 90 minutes.   Pain in right buttock 5 day history , acute onset , no trauma, short anti inflammatory course and tramadol

## 2016-06-03 NOTE — Assessment & Plan Note (Signed)
Uncontrolled, non compliant with medication, scripts sent DASH diet and commitment to daily physical activity for a minimum of 30 minutes discussed and encouraged, as a part of hypertension management. The importance of attaining a healthy weight is also discussed.  BP/Weight 05/29/2016 10/04/2015 03/28/2015 09/22/2014 05/04/2014 11/15/2013 [id number]  Systolic BP 0000000 [id number] [id number] [id number]  Diastolic BP 80 74 78 64 78 80 85  Wt. (Lbs) 203 192 204 190 201.4 207 207  BMI 29.96 28.34 29.27 27.26 28.48 29.27 29.7

## 2016-06-03 NOTE — Assessment & Plan Note (Signed)
5 day history , acute onset , no trauma, short anti inflammatory course and tramadol

## 2016-06-03 NOTE — Assessment & Plan Note (Signed)
The increased risk of cardiovascular disease associated with this diagnosis, and the need to consistently work on lifestyle to change this is discussed. Following  a  heart healthy diet ,commitment to 30 minutes of exercise at least 5 days per week, as well as control of blood sugar and cholesterol , and achieving a healthy weight are all the areas to be addressed .  

## 2016-06-03 NOTE — Assessment & Plan Note (Signed)
Deteriorated Hyperlipidemia:Low fat diet discussed and encouraged.   Lipid Panel  Lab Results  Component Value Date   CHOL 230* 05/29/2016   HDL 68 05/29/2016   LDLCALC 152* 05/29/2016   TRIG 49 05/29/2016   CHOLHDL 3.4 05/29/2016   Not at goal , need to verify current meds and address appropriately

## 2016-06-03 NOTE — Assessment & Plan Note (Signed)
Deteriorated. Patient re-educated about  the importance of commitment to a  minimum of 150 minutes of exercise per week.  The importance of healthy food choices with portion control discussed. Encouraged to start a food diary, count calories and to consider  joining a support group. Sample diet sheets offered. Goals set by the patient for the next several months.   Weight /BMI 05/29/2016 10/04/2015 03/28/2015  WEIGHT 203 lb 192 lb 204 lb  HEIGHT 5\' 9"  5\' 9"  5\' 10"   BMI 29.96 kg/m2 28.34 kg/m2 29.27 kg/m2    Current exercise per week 90 minutes.

## 2016-06-21 MED ORDER — ATORVASTATIN CALCIUM 20 MG PO TABS: 20.0000 mg | ORAL_TABLET | Freq: Every day | ORAL | Status: DC

## 2016-06-21 MED ORDER — VITAMIN D (ERGOCALCIFEROL) 1.25 MG (50000 UNIT) PO CAPS: 50000.0000 [IU] | ORAL_CAPSULE | ORAL | Status: DC

## 2016-06-21 NOTE — Addendum Note (Signed)
Addended by: Eual Fines on: 06/21/2016 11:58 AM   Modules accepted: Orders, Medications

## 2016-07-01 ENCOUNTER — Telehealth: Payer: Self-pay | Admitting: Family Medicine

## 2016-07-01 ENCOUNTER — Other Ambulatory Visit: Payer: Self-pay | Admitting: Family Medicine

## 2016-07-01 MED ORDER — TRAMADOL HCL 50 MG PO TABS: 50.0000 mg | ORAL_TABLET | Freq: Four times a day (QID) | ORAL | Status: DC | PRN

## 2016-07-01 NOTE — Telephone Encounter (Signed)
Printed , please fax to [facility] in [place] and let him know, thanks

## 2016-07-01 NOTE — Telephone Encounter (Signed)
PATIENT IS ASKING FOR A REFILL ON TRAMADOL, PLEASE ADVISE

## 2016-07-02 ENCOUNTER — Other Ambulatory Visit: Payer: Self-pay | Admitting: Family Medicine

## 2016-07-02 NOTE — Telephone Encounter (Signed)
Left patient a message on machine

## 2016-07-07 ENCOUNTER — Other Ambulatory Visit: Payer: Self-pay

## 2016-08-12 ENCOUNTER — Encounter: Payer: Self-pay | Admitting: Family Medicine

## 2016-08-12 ENCOUNTER — Ambulatory Visit (INDEPENDENT_AMBULATORY_CARE_PROVIDER_SITE_OTHER): Payer: BLUE CROSS/BLUE SHIELD | Admitting: Family Medicine

## 2016-08-12 VITALS — BP 134/80 | HR 60 | Resp 16 | Ht 69.0 in | Wt 195.0 lb

## 2016-08-12 DIAGNOSIS — R7302 Impaired glucose tolerance (oral): Secondary | ICD-10-CM | POA: Diagnosis not present

## 2016-08-12 DIAGNOSIS — E785 Hyperlipidemia, unspecified: Secondary | ICD-10-CM

## 2016-08-12 DIAGNOSIS — Z125 Encounter for screening for malignant neoplasm of prostate: Secondary | ICD-10-CM

## 2016-08-12 DIAGNOSIS — M791 Myalgia: Secondary | ICD-10-CM

## 2016-08-12 DIAGNOSIS — M7918 Myalgia, other site: Secondary | ICD-10-CM

## 2016-08-12 DIAGNOSIS — Z23 Encounter for immunization: Secondary | ICD-10-CM | POA: Diagnosis not present

## 2016-08-12 DIAGNOSIS — I10 Essential (primary) hypertension: Secondary | ICD-10-CM

## 2016-08-12 MED ORDER — PNEUMOCOCCAL 13-VAL CONJ VACC IM SUSP: 0.5000 mL | Freq: Once | INTRAMUSCULAR | Status: DC

## 2016-08-12 NOTE — Patient Instructions (Signed)
Annual physical early t mid  December , call if you need me before  Excellent blood pressure, keep taking medication as prescribed every day  Flu and prevnar today  Fating lipid, cmp and EGFr, HBa1C, PSA end November  Thank you  for choosing [facility]. We consider it a privelige to serve you.  Delivering excellent health care in a caring and  compassionate way is our goal.  Partnering with you,  so that together we can achieve this goal is our strategy.

## 2016-08-17 DIAGNOSIS — Z23 Encounter for immunization: Secondary | ICD-10-CM | POA: Insufficient documentation

## 2016-08-17 NOTE — Assessment & Plan Note (Signed)
After obtaining informed consent, the vaccine is  administered by LPN.  

## 2016-08-17 NOTE — Progress Notes (Signed)
Marvin [name]     MRN: [id number]      DOB: [birth year]   HPI Mr. Brueck is here for follow up, primarily of uncontrolled hypertension  and re-evaluation of chronic medical conditions, medication management and review of any available recent lab and radiology data.  Preventive health is updated, specifically  Cancer screening and Immunization.   Questions or concerns regarding consultations or procedures which the PT has had in the interim are  addressed. The PT denies any adverse reactions to current medications since the last visit.  There are no new concerns.  There are no specific complaints   ROS Denies recent fever or chills. Denies sinus pressure, nasal congestion, ear pain or sore throat. Denies chest congestion, productive cough or wheezing. Denies chest pains, palpitations and leg swelling Denies abdominal pain, nausea, vomiting,diarrhea or constipation.   Denies dysuria, frequency, hesitancy or incontinence. Denies joint pain, swelling and limitation in mobility. Denies headaches, seizures, numbness, or tingling. Denies depression, anxiety or insomnia. Denies skin break down or rash.   PE  BP 134/80   Pulse 60   Resp 16   Ht 5\' 9"  (1.753 m)   Wt 195 lb (88.5 kg)   SpO2 98%   BMI 28.80 kg/m   Patient alert and oriented and in no cardiopulmonary distress.  HEENT: No facial asymmetry, EOMI,   oropharynx pink and moist.  Neck supple no JVD, no mass.  Chest: Clear to auscultation bilaterally.  CVS: S1, S2 no murmurs, no S3.Regular rate.  ABD: Soft non tender.   Ext: No edema  MS: Adequate ROM spine, shoulders, hips and knees.  Skin: Intact, no ulcerations or rash noted.  Psych: Good eye contact, normal affect. Memory intact not anxious or depressed appearing.  CNS: CN 2-12 intact, power,  normal throughout.no focal deficits noted.   Assessment & Plan  Hypertension Controlled, no change in medication DASH diet and commitment to daily physical  activity for a minimum of 30 minutes discussed and encouraged, as a part of hypertension management. The importance of attaining a healthy weight is also discussed.  BP/Weight 08/12/2016 05/29/2016 10/04/2015 03/28/2015 09/22/2014 05/04/2014 [id number]  Systolic BP Q000111Q 0000000 [id number] [id number] [id number]  Diastolic BP 80 80 74 78 64 78 80  Wt. (Lbs) 195 203 192 204 190 201.4 207  BMI 28.8 29.96 28.34 29.27 27.26 28.48 29.27       Pain in right buttock Controlled, no change in medication   Need for prophylactic vaccination and inoculation against influenza After obtaining informed consent, the vaccine is  administered by LPN.   Need for vaccination with 13-polyvalent pneumococcal conjugate vaccine After obtaining informed consent, the vaccine is  administered by LPN.   Hyperlipidemia Hyperlipidemia:Low fat diet discussed and encouraged.   Lipid Panel  Lab Results  Component Value Date   CHOL 230 (H) 05/29/2016   HDL 68 05/29/2016   LDLCALC 152 (H) 05/29/2016   TRIG 49 05/29/2016   CHOLHDL 3.4 05/29/2016   Uncontrolled Updated lab needed at/ before next visit.    IGT (impaired glucose tolerance) Patient educated about the importance of limiting  Carbohydrate intake , the need to commit to daily physical activity for a minimum of 30 minutes , and to commit weight loss. The fact that changes in all these areas will reduce or eliminate all together the development of diabetes is stressed.   Diabetic Labs Latest Ref Rng & Units 05/29/2016 09/29/2015 09/29/2015 03/24/2015 04/29/2014  HbA1c <5.7 % 6.2(H) - 6.1(H)  6.3(H) 6.2(H)  Chol 125 - 200 mg/dL 230(H) - 219(H) 182 168  HDL >=40 mg/dL 68 - 68 55 53  Calc LDL <130 mg/dL 152(H) - 140(H) 115(H) 103(H)  Triglycerides <150 mg/dL 49 - 56 62 60  Creatinine 0.70 - 1.25 mg/dL 1.10 1.08 1.08 1.15 1.30   BP/Weight 08/12/2016 05/29/2016 10/04/2015 03/28/2015 09/22/2014 05/04/2014 [id number]  Systolic BP Q000111Q 0000000 [id number] [id number]  Diastolic BP 80 80 74  78 64 78 80  Wt. (Lbs) 195 203 192 204 190 201.4 207  BMI 28.8 29.96 28.34 29.27 27.26 28.48 29.27   No flowsheet data found.  Deteriorated Updated lab needed at/ before next visit.

## 2016-08-17 NOTE — Assessment & Plan Note (Signed)
Hyperlipidemia:Low fat diet discussed and encouraged.   Lipid Panel  Lab Results  Component Value Date   CHOL 230 (H) 05/29/2016   HDL 68 05/29/2016   LDLCALC 152 (H) 05/29/2016   TRIG 49 05/29/2016   CHOLHDL 3.4 05/29/2016   Uncontrolled Updated lab needed at/ before next visit.

## 2016-08-17 NOTE — Assessment & Plan Note (Signed)
Controlled, no change in medication DASH diet and commitment to daily physical activity for a minimum of 30 minutes discussed and encouraged, as a part of hypertension management. The importance of attaining a healthy weight is also discussed.  BP/Weight 08/12/2016 05/29/2016 10/04/2015 03/28/2015 09/22/2014 05/04/2014 [id number]  Systolic BP Q000111Q 0000000 [id number] 123456 [id number]  Diastolic BP 80 80 74 78 64 78 80  Wt. (Lbs) 195 203 192 204 190 201.4 207  BMI 28.8 29.96 28.34 29.27 27.26 28.48 29.27

## 2016-08-17 NOTE — Assessment & Plan Note (Signed)
Controlled, no change in medication  

## 2016-08-17 NOTE — Assessment & Plan Note (Signed)
Patient educated about the importance of limiting  Carbohydrate intake , the need to commit to daily physical activity for a minimum of 30 minutes , and to commit weight loss. The fact that changes in all these areas will reduce or eliminate all together the development of diabetes is stressed.   Diabetic Labs Latest Ref Rng & Units 05/29/2016 09/29/2015 09/29/2015 03/24/2015 04/29/2014  HbA1c <5.7 % 6.2(H) - 6.1(H) 6.3(H) 6.2(H)  Chol 125 - 200 mg/dL 230(H) - 219(H) 182 168  HDL >=40 mg/dL 68 - 68 55 53  Calc LDL <130 mg/dL 152(H) - 140(H) 115(H) 103(H)  Triglycerides <150 mg/dL 49 - 56 62 60  Creatinine 0.70 - 1.25 mg/dL 1.10 1.08 1.08 1.15 1.30   BP/Weight 08/12/2016 05/29/2016 10/04/2015 03/28/2015 09/22/2014 05/04/2014 [id number]  Systolic BP Q000111Q 0000000 [id number]  Diastolic BP 80 80 74 78 64 78 80  Wt. (Lbs) 195 203 192 204 190 201.4 207  BMI 28.8 29.96 28.34 29.27 27.26 28.48 29.27   No flowsheet data found.  Deteriorated Updated lab needed at/ before next visit.

## 2016-12-10 ENCOUNTER — Encounter: Payer: BLUE CROSS/BLUE SHIELD | Admitting: Family Medicine

## 2016-12-10 ENCOUNTER — Encounter: Payer: Self-pay | Admitting: Family Medicine

## 2017-01-12 ENCOUNTER — Ambulatory Visit (INDEPENDENT_AMBULATORY_CARE_PROVIDER_SITE_OTHER): Payer: BLUE CROSS/BLUE SHIELD | Admitting: Family Medicine

## 2017-01-12 ENCOUNTER — Encounter: Payer: Self-pay | Admitting: Family Medicine

## 2017-01-12 VITALS — BP 160/84 | HR 66 | Resp 16 | Ht 69.0 in | Wt 206.0 lb

## 2017-01-12 DIAGNOSIS — E663 Overweight: Secondary | ICD-10-CM

## 2017-01-12 DIAGNOSIS — I10 Essential (primary) hypertension: Secondary | ICD-10-CM | POA: Diagnosis not present

## 2017-01-12 DIAGNOSIS — R7301 Impaired fasting glucose: Secondary | ICD-10-CM

## 2017-01-12 DIAGNOSIS — Z125 Encounter for screening for malignant neoplasm of prostate: Secondary | ICD-10-CM | POA: Diagnosis not present

## 2017-01-12 DIAGNOSIS — R413 Other amnesia: Secondary | ICD-10-CM

## 2017-01-12 DIAGNOSIS — E782 Mixed hyperlipidemia: Secondary | ICD-10-CM | POA: Diagnosis not present

## 2017-01-12 DIAGNOSIS — E559 Vitamin D deficiency, unspecified: Secondary | ICD-10-CM

## 2017-01-12 DIAGNOSIS — I491 Atrial premature depolarization: Secondary | ICD-10-CM

## 2017-01-12 HISTORY — DX: Other amnesia: R41.3

## 2017-01-12 LAB — COMPREHENSIVE METABOLIC PANEL
ALT: 21 U/L (ref 9–46)
AST: 20 U/L (ref 10–35)
Albumin: 3.8 g/dL (ref 3.6–5.1)
Alkaline Phosphatase: 81 U/L (ref 40–115)
BUN: 14 mg/dL (ref 7–25)
CO2: 29 mmol/L (ref 20–31)
Calcium: 9.5 mg/dL (ref 8.6–10.3)
Chloride: 104 mmol/L (ref 98–110)
Creat: 1.08 mg/dL (ref 0.70–1.25)
Glucose, Bld: 94 mg/dL (ref 65–99)
Potassium: 4.9 mmol/L (ref 3.5–5.3)
Sodium: 140 mmol/L (ref 135–146)
Total Bilirubin: 0.5 mg/dL (ref 0.2–1.2)
Total Protein: 7.1 g/dL (ref 6.1–8.1)

## 2017-01-12 LAB — LIPID PANEL
Cholesterol: 194 mg/dL (ref <200)
HDL: 67 mg/dL (ref >40)
LDL Cholesterol: 110 mg/dL — ABNORMAL HIGH (ref <100)
Total CHOL/HDL Ratio: 2.9 Ratio (ref <5.0)
Triglycerides: 84 mg/dL (ref <150)
VLDL: 17 mg/dL (ref <30)

## 2017-01-12 LAB — HEMOGLOBIN A1C
Hgb A1c MFr Bld: 5.7 % — ABNORMAL HIGH (ref <5.7)
Mean Plasma Glucose: 117 mg/dL

## 2017-01-12 LAB — PSA: PSA: 0.7 ng/mL (ref <=4.0)

## 2017-01-12 MED ORDER — CLONIDINE HCL 0.2 MG PO TABS: 0.2000 mg | ORAL_TABLET | Freq: Three times a day (TID) | ORAL | 2 refills | Status: DC

## 2017-01-12 NOTE — Patient Instructions (Addendum)
F/u in 4 weeks, call if you need em sooner  Renne Crigler re referred urgently to neurology and for brain scan    Labs today, cmp , HBA1C, pSa, lipid, vit D   Increase clonidine to three times daily , 6am, 2pm and 10 pm, blood pressure is high  You are being referredd to cardiology for irregular heart rate

## 2017-01-12 NOTE — Progress Notes (Signed)
POET STUTE     MRN: [id number]      DOB: July 05, 1951   HPI Marvin [name] is here and seen as a work in because wife states he has had increased forgetfulness and at times sems confused, to the extent that it has been affecting his job, and he is currently out because of inability to current job and his refusal to do the ewer task assigned, as it would be less pay. Pt states he feels fine" and denies any awareness of problems with memory or decreased level of function. Wife states she absolutely sees a difference at home, he often repeats himself, asks same questions over, is forgetful and sometimes seems confuseuation He also has a longstanding h/o irregular heart rate and bradycardia and needs cardiology re evaluation ROS Denies recent fever or chills. Denies sinus pressure, nasal congestion, ear pain or sore throat. Denies chest congestion, productive cough or wheezing. Denies chest pains, palpitations and leg swelling Denies abdominal pain, nausea, vomiting,diarrhea or constipation.   Denies dysuria, frequency, hesitancy or incontinence. Denies joint pain, swelling and limitation in mobility. Denies headaches, seizures, numbness, or tingling. Denies depression, anxiety or insomnia. Denies skin break down or rash.   PE  BP (!) 160/84   Pulse 66   Resp 16   Ht 5\' 9"  (1.753 m)   Wt 206 lb (93.4 kg)   SpO2 99%   BMI 30.42 kg/m   Patient alert  and in no cardiopulmonary distress.Unable to state the year  HEENT: No facial asymmetry, EOMI,   oropharynx pink and moist.  Neck supple no JVD, no mass.  Chest: Clear to auscultation bilaterally.  CVS: S1, S2 no murmurs, no S3.IrRegular rate.  ABD: Soft non tender.   Ext: No edema  MS: Adequate ROM spine, shoulders, hips and knees.  Skin: Intact, no ulcerations or rash noted.  Psych: Good eye contact, Memory impaired not anxious or depressed appearing.  CNS: CN 2-12 intact, power,  normal throughout.no focal deficits  noted.   Assessment & Plan  Memory loss of unknown cause 1 year h/o progressive memory loss and confusion, creating problems on the job, unknown to spouse, but abnormal behavior noted at home also, needs brain scan and neurology eval asap. Is advised against returning to work unless he accepts new job recommended by company , based on observed level of function  Hypertension Uncontrolled , increase clonidine to three times dail DASH diet and commitment to daily physical activity for a minimum of 30 minutes discussed and encouraged, as a part of hypertension management. The importance of attaining a healthy weight is also discussed.  BP/Weight 01/12/2017 08/12/2016 05/29/2016 10/04/2015 03/28/2015 09/22/2014 0000000  Systolic BP 0000000 Q000111Q 0000000 [id number]  Diastolic BP 84 80 80 74 78 64 78  Wt. (Lbs) 206 195 203 192 204 190 201.4  BMI 30.42 28.8 29.96 28.34 29.27 27.26 28.48       Hyperlipidemia Hyperlipidemia:Low fat diet discussed and encouraged.   Lipid Panel  Lab Results  Component Value Date   CHOL 194 01/12/2017   HDL 67 01/12/2017   LDLCALC 110 (H) 01/12/2017   TRIG 84 01/12/2017   CHOLHDL 2.9 01/12/2017     Updated lab needed at/ before next visit.   Overweight (BMI 25.0-29.9) Deteriorated. Patient re-educated about  the importance of commitment to a  minimum of 150 minutes of exercise per week.  The importance of healthy food choices with portion control discussed. Encouraged to start a food diary,  count calories and to consider  joining a support group. Sample diet sheets offered. Goals set by the patient for the next several months.   Weight /BMI 01/12/2017 08/12/2016 05/29/2016  WEIGHT 206 lb 195 lb 203 lb  HEIGHT 5\' 9"  5\' 9"  5\' 9"   BMI 30.42 kg/m2 28.8 kg/m2 29.96 kg/m2      Premature atrial contractions Cardiology to re eval and manage pt with PAC , uncontrolled HTN and new onset memory loss or apparent episodes of confusion intermittently

## 2017-01-12 NOTE — Assessment & Plan Note (Addendum)
1 year h/o progressive memory loss and confusion, creating problems on the job, unknown to spouse, but abnormal behavior noted at home also, needs brain scan and neurology eval asap. Is advised against returning to work unless he accepts new job recommended by company , based on observed level of function

## 2017-01-12 NOTE — Assessment & Plan Note (Signed)
Uncontrolled , increase clonidine to three times dail DASH diet and commitment to daily physical activity for a minimum of 30 minutes discussed and encouraged, as a part of hypertension management. The importance of attaining a healthy weight is also discussed.  BP/Weight 01/12/2017 08/12/2016 05/29/2016 10/04/2015 03/28/2015 09/22/2014 0000000  Systolic BP 0000000 Q000111Q 0000000 [id number]  Diastolic BP 84 80 80 74 78 64 78  Wt. (Lbs) 206 195 203 192 204 190 201.4  BMI 30.42 28.8 29.96 28.34 29.27 27.26 28.48

## 2017-01-12 NOTE — Assessment & Plan Note (Signed)
Cardiology to re eval and manage pt with PAC , uncontrolled HTN and new onset memory loss or apparent episodes of confusion intermittently

## 2017-01-12 NOTE — Assessment & Plan Note (Signed)
Deteriorated. Patient re-educated about  the importance of commitment to a  minimum of 150 minutes of exercise per week.  The importance of healthy food choices with portion control discussed. Encouraged to start a food diary, count calories and to consider  joining a support group. Sample diet sheets offered. Goals set by the patient for the next several months.   Weight /BMI 01/12/2017 08/12/2016 05/29/2016  WEIGHT 206 lb 195 lb 203 lb  HEIGHT 5\' 9"  5\' 9"  5\' 9"   BMI 30.42 kg/m2 28.8 kg/m2 29.96 kg/m2

## 2017-01-12 NOTE — Assessment & Plan Note (Signed)
Hyperlipidemia:Low fat diet discussed and encouraged.   Lipid Panel  Lab Results  Component Value Date   CHOL 194 01/12/2017   HDL 67 01/12/2017   LDLCALC 110 (H) 01/12/2017   TRIG 84 01/12/2017   CHOLHDL 2.9 01/12/2017     Updated lab needed at/ before next visit.

## 2017-01-13 ENCOUNTER — Encounter: Payer: Self-pay | Admitting: Neurology

## 2017-01-13 ENCOUNTER — Ambulatory Visit (INDEPENDENT_AMBULATORY_CARE_PROVIDER_SITE_OTHER): Payer: BLUE CROSS/BLUE SHIELD | Admitting: Neurology

## 2017-01-13 VITALS — BP 198/83 | HR 52 | Ht 69.0 in | Wt 207.5 lb

## 2017-01-13 DIAGNOSIS — R413 Other amnesia: Secondary | ICD-10-CM

## 2017-01-13 LAB — VITAMIN D 25 HYDROXY (VIT D DEFICIENCY, FRACTURES): Vit D, 25-Hydroxy: 36 ng/mL (ref 30–100)

## 2017-01-13 MED ORDER — DONEPEZIL HCL 5 MG PO TABS: 5.0000 mg | ORAL_TABLET | Freq: Every day | ORAL | 1 refills | Status: DC

## 2017-01-13 NOTE — Patient Instructions (Signed)
   Begin Aricept (donepezil) at 5 mg at night for one month. If this medication is well-tolerated, please call our office and we will call in a prescription for the 10 mg tablets. Look out for side effects that may include nausea, diarrhea, weight loss, or stomach cramps. This medication will also cause a runny nose, therefore there is no need for allergy medications for this purpose.  

## 2017-01-13 NOTE — Progress Notes (Signed)
Reason for visit: Memory disturbance  Referring physician: Dr. [name] is a 66 y.o. male  History of present illness:  [name] is a 66 year old right-handed black male a history of memory issues going on about one year. The patient has a very prominent family history of Alzheimer's disease seen in multiple maternal aunts and uncles, and the patient's mother and the patient has a sister who developed Alzheimer's disease at age 87, one brother may also be affected. The patient has been performing his work task for almost 30 years, within the last 6 months he has had difficulty performing the task because he is unable to remember what to do. The patient has had a final warning at work. The patient has had some minor issues with directions with driving. He does not pay the bills or do the finances at home, he needs some assistance with keeping up with medications. The patient is able to keep up with appointments. He has lost interest in playing golf which he loved to do. The patient denies any fatigue issues, he denies difficulty controlling the bowels or the bladder or any balance issues. He sleeps well at night. The patient does have some problems with repeating himself, he has difficulty remembering names for people. He is sent to this office for an evaluation. MRI of the brain has been ordered, it has not yet been done.  Past Medical History:  Diagnosis Date  . Degenerative joint disease   . Gout   . Hemorrhoids    Dr. Romona Curls  . Hyperlipidemia   . Hypertension   . Impaired glucose tolerance   . Premature atrial contractions     Past Surgical History:  Procedure Laterality Date  . COLONOSCOPY  07/2004  . COLONOSCOPY N/A 09/22/2014   Procedure: COLONOSCOPY;  Surgeon: Daneil Dolin, MD;  Location: [facility];  Service: Endoscopy;  Laterality: N/A;  9:30 AM        PT REQUEST TIME  . KNEE ARTHROSCOPY     Right    Family History  Problem Relation Age of Onset    . Alzheimer's disease Mother   . Diabetes Father   . Heart failure Father     prostate disease   . Alzheimer's disease Sister   . Kidney failure Brother   . Diabetes Brother   . High blood pressure Brother   . Cancer Brother     Social history:  reports that he quit smoking about 26 years ago. He has never used smokeless tobacco. He reports that he does not drink alcohol or use drugs.  Medications:  Prior to Admission medications   Medication Sig Start Date End Date Taking? Authorizing Provider  amLODipine (NORVASC) 10 MG tablet Take 1 tablet (10 mg total) by mouth daily. 05/29/16  Yes Fayrene Helper, MD  atorvastatin (LIPITOR) 20 MG tablet Take 1 tablet (20 mg total) by mouth daily. 06/21/16  Yes Fayrene Helper, MD  benazepril (LOTENSIN) 40 MG tablet Take 1 tablet (40 mg total) by mouth daily. 05/29/16  Yes Fayrene Helper, MD  cloNIDine (CATAPRES) 0.2 MG tablet Take 1 tablet (0.2 mg total) by mouth 3 (three) times daily. 01/12/17  Yes Fayrene Helper, MD  hydrochlorothiazide (HYDRODIURIL) 25 MG tablet Take 1 tablet (25 mg total) by mouth daily. 05/29/16  Yes Fayrene Helper, MD  Vitamin D, Ergocalciferol, (DRISDOL) 50000 units CAPS capsule Take 1 capsule (50,000 Units total) by mouth every 7 (seven) days.  06/21/16  Yes Fayrene Helper, MD  donepezil (ARICEPT) 5 MG tablet Take 1 tablet (5 mg total) by mouth at bedtime. 01/13/17   Kathrynn Ducking, MD      Allergies  Allergen Reactions  . Aspirin   . Naprosyn [Naproxen]   . Penicillins     ROS:  Out of a complete 14 system review of symptoms, the patient complains only of the following symptoms, and all other reviewed systems are negative.  Memory loss, confusion  Blood pressure (!) 198/83, pulse (!) 52, height 5\' 9"  (1.753 m), weight 207 lb 8 oz (94.1 kg).  Physical Exam  General: The patient is alert and cooperative at the time of the examination.  Eyes: Pupils are equal, round, and reactive to light. Discs  are flat bilaterally.  Neck: The neck is supple, no carotid bruits are noted.  Respiratory: The respiratory examination is clear.  Cardiovascular: The cardiovascular examination reveals a regular rate and rhythm, no obvious murmurs or rubs are noted.  Skin: Extremities are without significant edema.  Neurologic Exam  Mental status: The patient is alert and oriented x 2 at the time of the examination (not oriented to date). The Mini-Mental Status Examination done today shows a total score of 21/30.  Cranial nerves: Facial symmetry is present. There is good sensation of the face to pinprick and soft touch bilaterally. The strength of the facial muscles and the muscles to head turning and shoulder shrug are normal bilaterally. Speech is well enunciated, no aphasia or dysarthria is noted. Extraocular movements are full. Visual fields are full. The tongue is midline, and the patient has symmetric elevation of the soft palate. No obvious hearing deficits are noted.  Motor: The motor testing reveals 5 over 5 strength of all 4 extremities. Good symmetric motor tone is noted throughout.  Sensory: Sensory testing is intact to pinprick, soft touch, vibration sensation, and position sense on all 4 extremities. No evidence of extinction is noted.  Coordination: Cerebellar testing reveals good finger-nose-finger and heel-to-shin bilaterally.  Gait and station: Gait is normal. Tandem gait is normal. Romberg is negative. No drift is seen.  Reflexes: Deep tendon reflexes are symmetric and normal bilaterally. Toes are downgoing bilaterally.   Assessment/Plan:  1. Progressive memory disturbance  The patient has a very prominent family history of Alzheimer's disease, the patient may be developing the same issue. The patient will have MRI evaluation of the brain, he will have further blood work today, he will follow-up in 5 or 6 months to evaluate the memory. The patient will be started on Aricept, he will  call in one month if he is tolerating the medication to go up to the maintenance dose of 10 mg. We will contact him concerning the results of the above. The patient will be applying for disability through work.  Jill Alexanders MD 01/13/2017 12:14 PM  [facility] [address] [place], [place] [address]  Phone [phone] Fax [phone]

## 2017-01-14 ENCOUNTER — Telehealth: Payer: Self-pay

## 2017-01-14 LAB — RPR: RPR Ser Ql: NONREACTIVE

## 2017-01-14 LAB — HIV ANTIBODY (ROUTINE TESTING W REFLEX): HIV Screen 4th Generation wRfx: NONREACTIVE

## 2017-01-14 LAB — SEDIMENTATION RATE: Sed Rate: 2 mm/hr (ref 0–30)

## 2017-01-14 LAB — VITAMIN B12: Vitamin B-12: 256 pg/mL (ref 232–1245)

## 2017-01-14 NOTE — Telephone Encounter (Signed)
-----   Message from Kathrynn Ducking, MD sent at 01/14/2017  7:19 AM EST -----   The blood work results are unremarkable. Please call the patient.  ----- Message ----- From: Lavone Neri Lab Results In Sent: 01/14/2017   5:42 AM To: Kathrynn Ducking, MD

## 2017-01-14 NOTE — Telephone Encounter (Signed)
Called pt w/ unremarkable lab results. Verbalized understanding and appreciation for call. 

## 2017-01-15 ENCOUNTER — Telehealth: Payer: Self-pay | Admitting: Family Medicine

## 2017-01-15 NOTE — Telephone Encounter (Signed)
Left message for patient's wife that the office will be in touch.

## 2017-01-15 NOTE — Telephone Encounter (Signed)
Marvin [name] is calling asking to fax Pynn work note to his job from 01/09/17 until . Please advise as to  how long he must remain out of work. Thanks fax # [phone]

## 2017-01-15 NOTE — Telephone Encounter (Signed)
Pls let her know that I will need to get some input from the neurologist as to when he assess pt will be able to work, he has stated him on medication for memory. I am unsure if and when pt will be able to return to work . I will send neurologist a  message

## 2017-01-16 ENCOUNTER — Encounter: Payer: Self-pay | Admitting: Family Medicine

## 2017-01-19 ENCOUNTER — Ambulatory Visit (HOSPITAL_COMMUNITY)
Admission: RE | Admit: 2017-01-19 | Discharge: 2017-01-19 | Disposition: A | Discharge status: Home / Self Care | Payer: BLUE CROSS/BLUE SHIELD | Source: Ambulatory Visit | Attending: Family Medicine | Admitting: Family Medicine

## 2017-01-19 DIAGNOSIS — I6782 Cerebral ischemia: Secondary | ICD-10-CM | POA: Diagnosis not present

## 2017-01-19 DIAGNOSIS — R413 Other amnesia: Secondary | ICD-10-CM | POA: Diagnosis present

## 2017-01-19 DIAGNOSIS — R9082 White matter disease, unspecified: Secondary | ICD-10-CM | POA: Insufficient documentation

## 2017-01-19 MED ORDER — GADOBENATE DIMEGLUMINE 529 MG/ML IV SOLN
20.0000 mL | Freq: Once | INTRAVENOUS | Status: AC | PRN
  Administered 2017-01-19: 20 mL via INTRAVENOUS

## 2017-01-20 ENCOUNTER — Telehealth: Payer: Self-pay | Admitting: Neurology

## 2017-01-20 NOTE — Telephone Encounter (Addendum)
Dr Jannifer Franklin- please advise. You saw patient on 01/13/17. I did receive FMLA/Disability form to be filled out

## 2017-01-20 NOTE — Telephone Encounter (Signed)
Courtney with Dr. Griffin Dakin office called in reference to patient and how long patient will be out of work.  Please call and ask for [facility] or hit ext # [phone].

## 2017-01-20 NOTE — Telephone Encounter (Signed)
We are engaging a workup for dementia, I believe the patient is developing Alzheimer's disease.  The patient is not function well at work currently, I do not believe that he will be able to return to his current level work, the dementia process likely will be progressive, I would recommend that he retire at this time, and not return to work, he is 66 years old.

## 2017-01-20 NOTE — Telephone Encounter (Signed)
Thanks and I totally agree, I will pass this on to his wife clearly ,  Now that we are both saying the same thing

## 2017-01-20 NOTE — Telephone Encounter (Signed)
Wife aware

## 2017-01-21 NOTE — Telephone Encounter (Signed)
Gave completed paperwork to medical records to process.

## 2017-01-22 ENCOUNTER — Other Ambulatory Visit: Payer: Self-pay | Admitting: Family Medicine

## 2017-01-26 ENCOUNTER — Telehealth: Payer: Self-pay | Admitting: *Deleted

## 2017-01-26 NOTE — Telephone Encounter (Signed)
Pt FMLA form faxed to [phone]

## 2017-01-27 DIAGNOSIS — Z0289 Encounter for other administrative examinations: Secondary | ICD-10-CM

## 2017-02-12 ENCOUNTER — Ambulatory Visit (INDEPENDENT_AMBULATORY_CARE_PROVIDER_SITE_OTHER): Payer: BLUE CROSS/BLUE SHIELD | Admitting: Family Medicine

## 2017-02-12 ENCOUNTER — Encounter: Payer: Self-pay | Admitting: Family Medicine

## 2017-02-12 VITALS — BP 134/82 | HR 54 | Resp 15 | Ht 69.0 in | Wt 206.0 lb

## 2017-02-12 DIAGNOSIS — I10 Essential (primary) hypertension: Secondary | ICD-10-CM

## 2017-02-12 DIAGNOSIS — R413 Other amnesia: Secondary | ICD-10-CM | POA: Diagnosis not present

## 2017-02-12 DIAGNOSIS — E663 Overweight: Secondary | ICD-10-CM

## 2017-02-12 DIAGNOSIS — R7302 Impaired glucose tolerance (oral): Secondary | ICD-10-CM

## 2017-02-12 DIAGNOSIS — I491 Atrial premature depolarization: Secondary | ICD-10-CM

## 2017-02-12 DIAGNOSIS — E784 Other hyperlipidemia: Secondary | ICD-10-CM | POA: Diagnosis not present

## 2017-02-12 DIAGNOSIS — E7849 Other hyperlipidemia: Secondary | ICD-10-CM

## 2017-02-12 NOTE — Patient Instructions (Signed)
Welcome to [employer] early [place], call if you need me before  Please commit to new medication for memory. Call neurology for refill/ increased med dose  Please work on good  health habits so that your health will improve. 1. Commitment to daily physical activity for 30 to 60  minutes, if you are able to do this.  2. Commitment to wise food choices. Aim for half of your  food intake to be vegetable and fruit, one quarter starchy foods, and one quarter protein. Try to eat on a regular schedule  3 meals per day, snacking between meals should be limited to vegetables or fruits or small portions of nuts. 64 ounces of water per day is generally recommended, unless you have specific health conditions, like heart failure or kidney failure where you will need to limit fluid intake.  3. Commitment to sufficient and a  good quality of physical and mental rest daily, generally between 6 to 8 hours per day.  WITH PERSISTANCE AND PERSEVERANCE, THE IMPOSSIBLE , BECOMES THE NORM! It is important that you exercise regularly at least 30 minutes 5 times a week. If you develop chest pain, have severe difficulty breathing, or feel very tired, stop exercising immediately and seek medical attention  I will contact neurology also  ENJOY RETIREMENT!!!

## 2017-02-14 ENCOUNTER — Encounter: Payer: Self-pay | Admitting: Family Medicine

## 2017-02-14 NOTE — Assessment & Plan Note (Signed)
Asymptomatic and chronic, will refer to cardiology later this year for annual check

## 2017-02-14 NOTE — Assessment & Plan Note (Signed)
Unchnaged Patient re-educated about  the importance of commitment to a  minimum of 150 minutes of exercise per week.  The importance of healthy food choices with portion control discussed. Encouraged to start a food diary, count calories and to consider  joining a support group. Sample diet sheets offered. Goals set by the patient for the next several months.   Weight /BMI 02/12/2017 01/13/2017 01/12/2017  WEIGHT 206 lb 207 lb 8 oz 206 lb  HEIGHT 5\' 9"  5\' 9"  5\' 9"   BMI 30.42 kg/m2 30.64 kg/m2 30.42 kg/m2

## 2017-02-14 NOTE — Assessment & Plan Note (Signed)
Hyperlipidemia:Low fat diet discussed and encouraged.   Lipid Panel  Lab Results  Component Value Date   CHOL 194 01/12/2017   HDL 67 01/12/2017   LDLCALC 110 (H) 01/12/2017   TRIG 84 01/12/2017   CHOLHDL 2.9 01/12/2017  needs to reduce fat in diet

## 2017-02-14 NOTE — Assessment & Plan Note (Signed)
Controlled, no change in medication DASH diet and commitment to daily physical activity for a minimum of 30 minutes discussed and encouraged, as a part of hypertension management. The importance of attaining a healthy weight is also discussed.  BP/Weight 02/12/2017 01/13/2017 01/12/2017 08/12/2016 05/29/2016 10/04/2015 [id number]  Systolic BP Q000111Q 99991111 0000000 Q000111Q 0000000 [id number]  Diastolic BP 82 83 84 80 80 74 78  Wt. (Lbs) 206 207.5 206 195 203 192 204  BMI 30.42 30.64 30.42 28.8 29.96 28.34 29.27

## 2017-02-14 NOTE — Assessment & Plan Note (Signed)
Patient educated about the importance of limiting  Carbohydrate intake , the need to commit to daily physical activity for a minimum of 30 minutes , and to commit weight loss. The fact that changes in all these areas will reduce or eliminate all together the development of diabetes is stressed.  Improved  Diabetic Labs Latest Ref Rng & Units 01/12/2017 05/29/2016 09/29/2015 09/29/2015 03/24/2015  HbA1c <5.7 % 5.7(H) 6.2(H) - 6.1(H) 6.3(H)  Chol <200 mg/dL 194 230(H) - 219(H) 182  HDL >40 mg/dL 67 68 - 68 55  Calc LDL <100 mg/dL 110(H) 152(H) - 140(H) 115(H)  Triglycerides <150 mg/dL 84 49 - 56 62  Creatinine 0.70 - 1.25 mg/dL 1.08 1.10 1.08 1.08 1.15   BP/Weight 02/12/2017 01/13/2017 01/12/2017 08/12/2016 05/29/2016 10/04/2015 [id number]  Systolic BP Q000111Q 99991111 0000000 Q000111Q 0000000 [id number]  Diastolic BP 82 83 84 80 80 74 78  Wt. (Lbs) 206 207.5 206 195 203 192 204  BMI 30.42 30.64 30.42 28.8 29.96 28.34 29.27   No flowsheet data found.

## 2017-02-14 NOTE — Progress Notes (Signed)
KENET VISCARDI     MRN: [id number]      DOB: 10/17/1951   HPI Mr. Compere is here for follow up and re-evaluation of chronic medical conditions, medication management and review of any available recent lab and radiology data.  Preventive health is updated, specifically  Cancer screening and Immunization.   Questions or concerns regarding consultations or procedures which the PT has had in the interim are  Addressed.His wife accompanies him, and I discussed the fact that he is now diagnosed with early dementia, and that as a result , there is no return to  work option, and encouraged  Him to enjoy hiw well earned retirement. Also reviewed health  Strategies to improve his overall wellbeing and memory in particular. Both patient and wife seemed to accept ad understand the current situation.He has a sister with dementia who is near his age   The PT denies any adverse reactions to current medications since the last visit. Has been started on low dose aricept by neurology There are no new concerns.  There are no specific complaints   ROS Denies recent fever or chills. Denies sinus pressure, nasal congestion, ear pain or sore throat. Denies chest congestion, productive cough or wheezing. Denies chest pains, palpitations and leg swelling Denies abdominal pain, nausea, vomiting,diarrhea or constipation.   Denies dysuria, frequency, hesitancy or incontinence. Denies joint pain, swelling and limitation in mobility. Denies headaches, seizures, numbness, or tingling. Denies depression, anxiety or insomnia. Denies skin break down or rash.   PE  BP 134/82   Pulse (!) 54   Resp 15   Ht 5\' 9"  (1.753 m)   Wt 206 lb (93.4 kg)   SpO2 96%   BMI 30.42 kg/m   Patient alert and oriented and in no cardiopulmonary distress.  HEENT: No facial asymmetry, EOMI,   oropharynx pink and moist.  Neck supple no JVD, no mass.  Chest: Clear to auscultation bilaterally.  CVS: S1, S2 no murmurs, no  S3.Regular rate.  ABD: Soft non tender.   Ext: No edema  MS: Adequate ROM spine, shoulders, hips and knees.  Skin: Intact, no ulcerations or rash noted.  Psych: Good eye contact, normal affect. Memory loss not anxious or depressed appearing.  CNS: CN 2-12 intact, power,  normal throughout.no focal deficits noted.   Assessment & Plan  Hypertension Controlled, no change in medication DASH diet and commitment to daily physical activity for a minimum of 30 minutes discussed and encouraged, as a part of hypertension management. The importance of attaining a healthy weight is also discussed.  BP/Weight 02/12/2017 01/13/2017 01/12/2017 08/12/2016 05/29/2016 10/04/2015 [id number]  Systolic BP Q000111Q 99991111 0000000 Q000111Q 0000000 [id number]  Diastolic BP 82 83 84 80 80 74 78  Wt. (Lbs) 206 207.5 206 195 203 192 204  BMI 30.42 30.64 30.42 28.8 29.96 28.34 29.27       Hyperlipidemia Hyperlipidemia:Low fat diet discussed and encouraged.   Lipid Panel  Lab Results  Component Value Date   CHOL 194 01/12/2017   HDL 67 01/12/2017   LDLCALC 110 (H) 01/12/2017   TRIG 84 01/12/2017   CHOLHDL 2.9 01/12/2017  needs to reduce fat in diet     Memory loss of unknown cause Commited to treatment for dementia by neurology. Needs to retire , and accepts that he is no longer employable. Encouraged to commit to healthy lifestyle and med adherence, course is unpredictable   Overweight (BMI 25.0-29.9) Unchnaged Patient re-educated about  the importance of commitment  to a  minimum of 150 minutes of exercise per week.  The importance of healthy food choices with portion control discussed. Encouraged to start a food diary, count calories and to consider  joining a support group. Sample diet sheets offered. Goals set by the patient for the next several months.   Weight /BMI 02/12/2017 01/13/2017 01/12/2017  WEIGHT 206 lb 207 lb 8 oz 206 lb  HEIGHT 5\' 9"  5\' 9"  5\' 9"   BMI 30.42 kg/m2 30.64 kg/m2 30.42 kg/m2       Premature atrial contractions Asymptomatic and chronic, will refer to cardiology later this year for annual check  IGT (impaired glucose tolerance) Patient educated about the importance of limiting  Carbohydrate intake , the need to commit to daily physical activity for a minimum of 30 minutes , and to commit weight loss. The fact that changes in all these areas will reduce or eliminate all together the development of diabetes is stressed.  Improved  Diabetic Labs Latest Ref Rng & Units 01/12/2017 05/29/2016 09/29/2015 09/29/2015 03/24/2015  HbA1c <5.7 % 5.7(H) 6.2(H) - 6.1(H) 6.3(H)  Chol <200 mg/dL 194 230(H) - 219(H) 182  HDL >40 mg/dL 67 68 - 68 55  Calc LDL <100 mg/dL 110(H) 152(H) - 140(H) 115(H)  Triglycerides <150 mg/dL 84 49 - 56 62  Creatinine 0.70 - 1.25 mg/dL 1.08 1.10 1.08 1.08 1.15   BP/Weight 02/12/2017 01/13/2017 01/12/2017 08/12/2016 05/29/2016 10/04/2015 [id number]  Systolic BP Q000111Q 99991111 0000000 Q000111Q 0000000 [id number]  Diastolic BP 82 83 84 80 80 74 78  Wt. (Lbs) 206 207.5 206 195 203 192 204  BMI 30.42 30.64 30.42 28.8 29.96 28.34 29.27   No flowsheet data found.

## 2017-02-14 NOTE — Assessment & Plan Note (Signed)
Commited to treatment for dementia by neurology. Needs to retire , and accepts that he is no longer employable. Encouraged to commit to healthy lifestyle and med adherence, course is unpredictable

## 2017-02-22 ENCOUNTER — Other Ambulatory Visit: Payer: Self-pay | Admitting: Family Medicine

## 2017-02-24 ENCOUNTER — Ambulatory Visit: Payer: BLUE CROSS/BLUE SHIELD | Admitting: Cardiology

## 2017-02-26 ENCOUNTER — Other Ambulatory Visit: Payer: Self-pay | Admitting: Family Medicine

## 2017-03-17 ENCOUNTER — Other Ambulatory Visit: Payer: Self-pay | Admitting: Neurology

## 2017-03-17 ENCOUNTER — Other Ambulatory Visit: Payer: Self-pay | Admitting: *Deleted

## 2017-03-17 MED ORDER — DONEPEZIL HCL 10 MG PO TABS: 10.0000 mg | ORAL_TABLET | Freq: Every day | ORAL | 4 refills | Status: DC

## 2017-03-17 NOTE — Telephone Encounter (Signed)
Received request for refill for 5 mg donezepil.  I called and LMVM for pt/wife to call back re: if tolerating this can increase to 10mg  daily.

## 2017-03-17 NOTE — Telephone Encounter (Signed)
I called and spoke to wife.   Pt has tolerated the 5mg  tabs of donepezil.  Per last ofv note, if tolerates will increase to 10mg  po daily.  I relayed this to wife and ok to increase.

## 2017-04-05 ENCOUNTER — Other Ambulatory Visit: Payer: Self-pay | Admitting: Family Medicine

## 2017-04-27 ENCOUNTER — Other Ambulatory Visit: Payer: Self-pay | Admitting: Family Medicine

## 2017-06-03 ENCOUNTER — Other Ambulatory Visit: Payer: Self-pay | Admitting: Family Medicine

## 2017-07-06 ENCOUNTER — Telehealth: Payer: Self-pay | Admitting: *Deleted

## 2017-07-06 NOTE — Telephone Encounter (Signed)
Gave completed/signed abilities form from [employer] to medical records to process for pt.

## 2017-07-21 ENCOUNTER — Encounter (INDEPENDENT_AMBULATORY_CARE_PROVIDER_SITE_OTHER): Payer: Self-pay

## 2017-07-21 ENCOUNTER — Encounter: Payer: Self-pay | Admitting: Adult Health

## 2017-07-21 ENCOUNTER — Ambulatory Visit (INDEPENDENT_AMBULATORY_CARE_PROVIDER_SITE_OTHER): Payer: Self-pay | Admitting: Adult Health

## 2017-07-21 VITALS — BP 157/77 | HR 53 | Ht 70.0 in | Wt 211.8 lb

## 2017-07-21 DIAGNOSIS — Z0289 Encounter for other administrative examinations: Secondary | ICD-10-CM

## 2017-07-21 DIAGNOSIS — R413 Other amnesia: Secondary | ICD-10-CM

## 2017-07-21 NOTE — Patient Instructions (Signed)
Continue Aricept  Memory Score is stable If your symptoms worsen or you develop new symptoms please let us know.

## 2017-07-21 NOTE — Progress Notes (Signed)
[name]: Marvin [name] DOB: 1951/03/23  REASON FOR VISIT: follow up- memory HISTORY FROM: [name]  HISTORY OF PRESENT ILLNESS: Today 07/21/17 Marvin [name] is a 66 year old male with a history of memory disturbance. He returns today for follow-up. He was started on Aricept 10 mg daily. He reports that he is tolerating this well. His wife notes that he continues to be repetitive with questions. He is able to complete all ADLs independently. He does operate a motor vehicle independently however his wife has noticed that he sometimes gets confused at traffic stops. She reports that he sometimes does not understand when it is his turn to go. [name] reports good appetite. Denies any trouble sleeping. He returns today for an evaluation.  HISTORY1/30 Marvin [name] is a 66 year old right-handed black male a history of memory issues going on about one year. Marvin [name] has a very prominent family history of Alzheimer's disease seen in multiple maternal aunts and uncles, and Marvin [name]'s mother and Marvin [name] has a sister who developed Alzheimer's disease at age 46, one brother may also be affected. Marvin [name] has been performing his work task for almost 30 years, within Marvin last 6 months he has had difficulty performing Marvin task because he is unable to remember what to do. Marvin [name] has had a final warning at work. Marvin [name] has had some minor issues with directions with driving. He does not pay Marvin bills or do Marvin finances at home, he needs some assistance with keeping up with medications. Marvin [name] is able to keep up with appointments. He has lost interest in playing golf which he loved to do. Marvin [name] denies any fatigue issues, he denies difficulty controlling Marvin bowels or Marvin bladder or any balance issues. He sleeps well at night. Marvin [name] does have some problems with repeating himself, he has difficulty remembering names for people. He is sent to this office for an evaluation. MRI of Marvin  brain has been ordered, it has not yet been done.   REVIEW OF SYSTEMS: Out of a complete 14 system review of symptoms, Marvin [name] complains only of Marvin following symptoms, and all other reviewed systems are negative.  Joint pain, confusion, memory loss, activity change  ALLERGIES: Allergies  Allergen Reactions  . Aspirin   . Naprosyn [Naproxen]   . Penicillins     HOME MEDICATIONS: Outpatient Medications Prior to Visit  Medication Sig Dispense Refill  . amLODipine (NORVASC) 10 MG tablet TAKE ONE TABLET BY MOUTH ONCE DAILY 90 tablet 1  . atorvastatin (LIPITOR) 20 MG tablet TAKE ONE TABLET BY MOUTH ONCE DAILY 90 tablet 1  . benazepril (LOTENSIN) 40 MG tablet TAKE ONE TABLET BY MOUTH ONCE DAILY 90 tablet 1  . cloNIDine (CATAPRES) 0.2 MG tablet TAKE ONE TABLET BY MOUTH THREE TIMES DAILY 270 tablet 1  . donepezil (ARICEPT) 10 MG tablet Take 1 tablet (10 mg total) by mouth at bedtime. 90 tablet 4  . Vitamin D, Ergocalciferol, (DRISDOL) 50000 units CAPS capsule TAKE 1 CAPSULE BY MOUTH ONCE A WEEK (EVERY  7  DAYS) 12 capsule 0  . amLODipine (NORVASC) 10 MG tablet Take 1 tablet (10 mg total) by mouth daily. 30 tablet 3  . hydrochlorothiazide (HYDRODIURIL) 25 MG tablet Take 1 tablet (25 mg total) by mouth daily. 30 tablet 3   No facility-administered medications prior to visit.     PAST MEDICAL HISTORY: Past Medical History:  Diagnosis Date  . Degenerative joint disease   . Gout   .  Hemorrhoids    Dr. Romona Curls  . Hyperlipidemia   . Hypertension   . Impaired glucose tolerance   . Premature atrial contractions     PAST SURGICAL HISTORY: Past Surgical History:  Procedure Laterality Date  . COLONOSCOPY  07/2004  . COLONOSCOPY N/A 09/22/2014   Procedure: COLONOSCOPY;  Surgeon: Daneil Dolin, MD;  Location: [facility];  Service: Endoscopy;  Laterality: N/A;  9:30 AM        PT REQUEST TIME  . KNEE ARTHROSCOPY     Right    FAMILY HISTORY: Family History  Problem Relation Age  of Onset  . Alzheimer's disease Mother   . Diabetes Father   . Heart failure Father        prostate disease   . Alzheimer's disease Sister   . Kidney failure Brother   . Diabetes Brother   . High blood pressure Brother   . Cancer Brother     SOCIAL HISTORY: Social History   Social History  . Marital status: Married    Spouse name: N/A  . Number of children: 1  . Years of education: 65   Occupational History  . Textile Midwife   Social History Main Topics  . Smoking status: Former Smoker    Quit date: 1992  . Smokeless tobacco: Never Used  . Alcohol use No     Comment: Quit 1992  . Drug use: No  . Sexual activity: Not on file     Comment: Married   Other Topics Concern  . Not on file   Social History Narrative   Lives at home w/ his wife   Right-handed   Caffeine: 1 cup daily      PHYSICAL EXAM  Vitals:   07/21/17 1108  BP: (!) 157/77  Pulse: (!) 53  Weight: 211 lb 12.8 oz (96.1 kg)  Height: 5\' 10"  (1.778 m)   Body mass index is 30.39 kg/m.   MMSE - Mini Mental State Exam 07/21/2017 01/13/2017 01/12/2017  Orientation to time 1 2 4   Orientation to Place 2 2 3   Registration 3 3 3   Attention/ Calculation 5 5 5   Recall 0 0 0  Language- name 2 objects 2 2 2   Language- repeat 1 1 1   Language- follow 3 step command 3 3 3   Language- read & follow direction 1 1 1   Write a sentence 1 1 1   Copy design 1 1 1   Total score 20 21 24      Generalized: Well developed, in no acute distress   Neurological examination  Mentation: Alert oriented to time, place, history taking. Follows all commands speech and language fluent Cranial nerve II-XII: Pupils were equal round reactive to light. Extraocular movements were full, visual field were full on confrontational test. Facial sensation and strength were normal. Uvula tongue midline. Head turning and shoulder shrug  were normal and symmetric. Motor: Marvin motor testing reveals 5 over 5 strength of all 4  extremities. Good symmetric motor tone is noted throughout.  Sensory: Sensory testing is intact to soft touch on all 4 extremities. No evidence of extinction is noted.  Coordination: Cerebellar testing reveals good finger-nose-finger and heel-to-shin bilaterally.  Gait and station: Gait is normal.  Reflexes: Deep tendon reflexes are symmetric and normal bilaterally.   DIAGNOSTIC DATA (LABS, IMAGING, TESTING) - I reviewed [name] records, labs, notes, testing and imaging myself where available.  Lab Results  Component Value Date   WBC 3.5 (L) 05/29/2016   HGB  14.2 05/29/2016   HCT 42.8 05/29/2016   MCV 87.2 05/29/2016   PLT 265 05/29/2016      Component Value Date/Time   NA 140 01/12/2017 1009   K 4.9 01/12/2017 1009   CL 104 01/12/2017 1009   CO2 29 01/12/2017 1009   GLUCOSE 94 01/12/2017 1009   BUN 14 01/12/2017 1009   CREATININE 1.08 01/12/2017 1009   CALCIUM 9.5 01/12/2017 1009   PROT 7.1 01/12/2017 1009   ALBUMIN 3.8 01/12/2017 1009   AST 20 01/12/2017 1009   ALT 21 01/12/2017 1009   ALKPHOS 81 01/12/2017 1009   BILITOT 0.5 01/12/2017 1009   GFRNONAA 70 05/29/2016 0853   GFRAA 81 05/29/2016 0853   Lab Results  Component Value Date   CHOL 194 01/12/2017   HDL 67 01/12/2017   LDLCALC 110 (H) 01/12/2017   TRIG 84 01/12/2017   CHOLHDL 2.9 01/12/2017   Lab Results  Component Value Date   HGBA1C 5.7 (H) 01/12/2017   Lab Results  Component Value Date   VITAMINB12 256 01/13/2017   Lab Results  Component Value Date   TSH 0.67 05/29/2016      ASSESSMENT AND PLAN 66 y.o. year old male  has a past medical history of Degenerative joint disease; Gout; Hemorrhoids; Hyperlipidemia; Hypertension; Impaired glucose tolerance; and Premature atrial contractions. here with :  1. Memory disturbance  Marvin [name]'s memory score has remained stable. He will continue on Aricept 10 mg daily. He is advised that if his symptoms worsen or he develops new symptoms he should let us  know. He will follow-up in 6 months or sooner if needed.    Ward Givens, MSN, NP-C 07/21/2017, 11:20 AM [facility] [address] [place], [place] [id number] [phone]

## 2017-07-21 NOTE — Progress Notes (Signed)
I have read the note, and I agree with the clinical assessment and plan.  Dontrell Stuck KEITH   

## 2017-07-23 ENCOUNTER — Telehealth: Payer: Self-pay | Admitting: *Deleted

## 2017-07-23 ENCOUNTER — Encounter: Payer: Self-pay | Admitting: *Deleted

## 2017-07-23 NOTE — Telephone Encounter (Signed)
Shawna [place] Financial disability form in Dr Jannifer Franklin' office for review, signature.

## 2017-07-27 NOTE — Telephone Encounter (Signed)
Gave completed/signed form back to medical records to process for patient.  

## 2017-07-29 NOTE — Telephone Encounter (Signed)
Left message on vm for patient to call the office back in reference to his completed [employer] disability forms.  Paperwork given to medical records.

## 2017-07-30 NOTE — Telephone Encounter (Signed)
Pt wife has called in re: the status of the disability forms please call

## 2017-08-26 ENCOUNTER — Ambulatory Visit: Payer: BLUE CROSS/BLUE SHIELD

## 2017-08-27 ENCOUNTER — Encounter: Payer: BLUE CROSS/BLUE SHIELD | Admitting: Family Medicine

## 2017-10-22 ENCOUNTER — Telehealth: Payer: Self-pay | Admitting: Adult Health

## 2017-10-22 NOTE — Telephone Encounter (Signed)
I called the patient's wife.  Her phone was not working appropriately.  I called back and left a message.  I did advise that we would call her tomorrow to get the patient in for a sooner visit.  I did advise through voice message that if the patient had any immediate or concerning needs they would need to take him to the emergency room.  Advised that they should take anything unsafe out of the house if they feel that it posed a problem for the patient.

## 2017-10-22 NOTE — Telephone Encounter (Signed)
Wife said he leaving the house when no one is at home (she works 3rd shift)  going to aunts house to fix her bathroom when nothing is has been said about it, thinking he has bought a truck and is wanting her to take him to get it and that he's made 2 payment on it, saying he knows famous people he sees on TV. Wife said he is getting "snappy"with her, said he doesn't treat her like he used to, said there is a look in his eye that was not there before.,she is wanting to get firearms out of the house.  Please call asap, she will be going to be about 11am.

## 2017-10-23 NOTE — Telephone Encounter (Addendum)
Pt's wife returned NP's call. Msg relay and call transferred to [place].

## 2017-10-26 NOTE — Telephone Encounter (Signed)
I spoke with patient's wife on Friday and she agreed to [place] on 10/27/17.

## 2017-10-27 ENCOUNTER — Ambulatory Visit (INDEPENDENT_AMBULATORY_CARE_PROVIDER_SITE_OTHER): Payer: Medicare Other | Admitting: Adult Health

## 2017-10-27 ENCOUNTER — Encounter: Payer: Self-pay | Admitting: Adult Health

## 2017-10-27 VITALS — BP 180/94 | HR 54 | Wt 212.4 lb

## 2017-10-27 DIAGNOSIS — F0391 Unspecified dementia with behavioral disturbance: Secondary | ICD-10-CM

## 2017-10-27 NOTE — Progress Notes (Signed)
I have read the note, and I agree with the clinical assessment and plan.  Clemence Stillings KEITH   

## 2017-10-27 NOTE — Progress Notes (Signed)
PATIENT: Marvin [name] DOB: Oct 26, 1951  REASON FOR VISIT: follow up HISTORY FROM: patient  HISTORY OF PRESENT ILLNESS: Today 10/27/17  Marvin [name] is a 66 year old male with a history of memory disturbance.  He returns today for follow-up.  He remains on Aricept 10 mg daily.  I spoke to his wife prior to the visit.  She states that in the last 2-3 months his behavior has changed.  She states on 2 occasions he has left in the middle the night to go fix a family members bathroom.  She states that the bathroom is not broken.  She states that he also feels that when the TV is on whoever is talking is speaking directly to him.  She also states that he makes comments that those famous people on TV he knows personally.  As in he used to live next door to them. He has also been telling his wife that he bought a truck which he has not.  His wife states that she no longer lets him drive because he turned out in front of a vehicle.  She denies any recent change in his medications.  She reports that he is able to complete all ADLs independently.  He does not cook on the stove but does use the microwave.  The wife handles the finances.  She reports that she does not let him carry any type of credit card or debit card.  She states that she has also noticed that he has been more aggressive toward her.  She states that he raises his voice whereas before he used to never do that.  He returns today for an evaluation.  HISTORY 07/21/17:   Marvin [name] is a 66 year old male with a history of memory disturbance. He returns today for follow-up. He was started on Aricept 10 mg daily. He reports that he is tolerating this well. His wife notes that he continues to be repetitive with questions. He is able to complete all ADLs independently. He does operate a motor vehicle independently however his wife has noticed that he sometimes gets confused at traffic stops. She reports that he sometimes does not understand when it is  his turn to go. Patient reports good appetite. Denies any trouble sleeping. He returns today for an evaluation.  HISTORY1/30 Marvin [name] is a 66 year old right-handed black male a history of memory issues going on about one year. The patient has a very prominent family history of Alzheimer's disease seen in multiple maternal aunts and uncles, and the patient's mother and the patient has a sister who developed Alzheimer's disease at age 75, one brother mayalso be affected. The patient has been performing his work task for almost 30 years, within the last 6 months he has had difficulty performing the task because he is unable to remember what to do. The patient has had a final warning at work. The patient has had some minor issues with directions with driving. He does not pay the bills or do the finances at home, he needs some assistance with keeping up with medications. The patient is able to keep up with appointments. He has lost interest in playing golf which he loved to do. The patient denies any fatigue issues, he denies difficulty controlling the bowels or the bladder or any balance issues. He sleeps well at night. The patient does have some problems with repeating himself, he has difficulty remembering names for people. He is sent to this office for an evaluation. MRI ofthe brain  has been ordered, it has not yet been done.    REVIEW OF SYSTEMS: Out of a complete 14 system review of symptoms, the patient complains only of the following symptoms, and all other reviewed systems are negative.  Memory loss, behavior problems, confusion  ALLERGIES: Allergies  Allergen Reactions  . Aspirin   . Naprosyn [Naproxen]   . Penicillins     HOME MEDICATIONS: Outpatient Medications Prior to Visit  Medication Sig Dispense Refill  . amLODipine (NORVASC) 10 MG tablet TAKE ONE TABLET BY MOUTH ONCE DAILY 90 tablet 1  . atorvastatin (LIPITOR) 20 MG tablet TAKE ONE TABLET BY MOUTH ONCE DAILY 90 tablet 1  .  benazepril (LOTENSIN) 40 MG tablet TAKE ONE TABLET BY MOUTH ONCE DAILY 90 tablet 1  . cloNIDine (CATAPRES) 0.2 MG tablet TAKE ONE TABLET BY MOUTH THREE TIMES DAILY 270 tablet 1  . donepezil (ARICEPT) 10 MG tablet Take 1 tablet (10 mg total) by mouth at bedtime. 90 tablet 4  . Vitamin D, Ergocalciferol, (DRISDOL) 50000 units CAPS capsule TAKE 1 CAPSULE BY MOUTH ONCE A WEEK (EVERY  7  DAYS) 12 capsule 0   No facility-administered medications prior to visit.     PAST MEDICAL HISTORY: Past Medical History:  Diagnosis Date  . Degenerative joint disease   . Gout   . Hemorrhoids    Dr. Romona Curls  . Hyperlipidemia   . Hypertension   . Impaired glucose tolerance   . Premature atrial contractions     PAST SURGICAL HISTORY: Past Surgical History:  Procedure Laterality Date  . COLONOSCOPY  07/2004  . KNEE ARTHROSCOPY     Right    FAMILY HISTORY: Family History  Problem Relation Age of Onset  . Alzheimer's disease Mother   . Diabetes Father   . Heart failure Father        prostate disease   . Alzheimer's disease Sister   . Kidney failure Brother   . Diabetes Brother   . High blood pressure Brother   . Cancer Brother     SOCIAL HISTORY: Social History   Socioeconomic History  . Marital status: Married    Spouse name: Not on file  . Number of children: 1  . Years of education: 33  . Highest education level: Not on file  Social Needs  . Financial resource strain: Not on file  . Food insecurity - worry: Not on file  . Food insecurity - inability: Not on file  . Transportation needs - medical: Not on file  . Transportation needs - non-medical: Not on file  Occupational History  . Occupation: Systems developer: [employer]  Tobacco Use  . Smoking status: Former Smoker    Last attempt to quit: 1992    Years since quitting: 26.8  . Smokeless tobacco: Never Used  Substance and Sexual Activity  . Alcohol use: No    Comment: Quit 1992  . Drug use: No  .  Sexual activity: Not on file    Comment: Married  Other Topics Concern  . Not on file  Social History Narrative   Lives at home w/ his wife   Right-handed   Caffeine: 1 cup daily      PHYSICAL EXAM  Vitals:   10/27/17 1007  BP: (!) 180/94  Pulse: (!) 54  Weight: 212 lb 6.4 oz (96.3 kg)   Body mass index is 30.48 kg/m.   MMSE - Mini Mental State Exam 10/27/2017 07/21/2017 01/13/2017  Orientation to time  2 1 2   Orientation to Place 4 2 2   Registration 3 3 3   Attention/ Calculation 1 5 5   Recall 0 0 0  Language- name 2 objects 2 2 2   Language- repeat 0 1 1  Language- follow 3 step command 3 3 3   Language- read & follow direction 0 1 1  Language-read & follow direction-comments read it backwards - -  Write a sentence 1 1 1   Copy design 1 1 1   Total score 17 20 21      Generalized: Well developed, in no acute distress   Neurological examination  Mentation: Alert. Follows all commands speech and language fluent Cranial nerve II-XII: Pupils were equal round reactive to light. Extraocular movements were full, visual field were full on confrontational test. Facial sensation and strength were normal. Uvula tongue midline. Head turning and shoulder shrug  were normal and symmetric. Motor: The motor testing reveals 5 over 5 strength of all 4 extremities. Good symmetric motor tone is noted throughout.  Sensory: Sensory testing is intact to soft touch on all 4 extremities. No evidence of extinction is noted.  Coordination: Cerebellar testing reveals good finger-nose-finger and heel-to-shin bilaterally.  Gait and station: Gait is normal. Tandem gait is normal. Romberg is negative. No drift is seen.  Reflexes: Deep tendon reflexes are symmetric and normal bilaterally.   DIAGNOSTIC DATA (LABS, IMAGING, TESTING) - I reviewed patient records, labs, notes, testing and imaging myself where available.  Lab Results  Component Value Date   WBC 3.5 (L) 05/29/2016   HGB 14.2 05/29/2016    HCT 42.8 05/29/2016   MCV 87.2 05/29/2016   PLT 265 05/29/2016      Component Value Date/Time   NA 140 01/12/2017 1009   K 4.9 01/12/2017 1009   CL 104 01/12/2017 1009   CO2 29 01/12/2017 1009   GLUCOSE 94 01/12/2017 1009   BUN 14 01/12/2017 1009   CREATININE 1.08 01/12/2017 1009   CALCIUM 9.5 01/12/2017 1009   PROT 7.1 01/12/2017 1009   ALBUMIN 3.8 01/12/2017 1009   AST 20 01/12/2017 1009   ALT 21 01/12/2017 1009   ALKPHOS 81 01/12/2017 1009   BILITOT 0.5 01/12/2017 1009   GFRNONAA 70 05/29/2016 0853   GFRAA 81 05/29/2016 0853   Lab Results  Component Value Date   CHOL 194 01/12/2017   HDL 67 01/12/2017   LDLCALC 110 (H) 01/12/2017   TRIG 84 01/12/2017   CHOLHDL 2.9 01/12/2017   Lab Results  Component Value Date   HGBA1C 5.7 (H) 01/12/2017   Lab Results  Component Value Date   VITAMINB12 256 01/13/2017   Lab Results  Component Value Date   TSH 0.67 05/29/2016      ASSESSMENT AND PLAN 66 y.o. year old male  has a past medical history of Degenerative joint disease, Gout, Hemorrhoids, Hyperlipidemia, Hypertension, Impaired glucose tolerance, and Premature atrial contractions. here with:  1.  Dementia with behavioral disturbance  The patient's memory score has decreased slightly since his visit in August.  I will check blood work and a urinalysis to rule out infection or electrolyte imbalances.  The patient will continue on Aricept.  We may consider starting Namenda.  I discussed with the patient's wife nonpharmacological ways to handle his behavior.  She voiced understanding.  Id she does not find these beneficial we may need to consider medication in the future.  She voiced understanding.  Patient's blood pressure is slightly elevated during today's visit.  I have encouraged the patient  and his wife to monitor his blood pressure.  If it remains elevated they should make his primary care aware.  He will follow-up in 6 months or sooner if needed  I spent 25 minutes  with the patient. 50% of this time was spent discussing nonpharmacological ways to handle his behavior as well as potential causes.     Ward Givens, MSN, NP-C 10/27/2017, 10:06 AM [facility] [address], [place], [place] [address] [phone]

## 2017-10-27 NOTE — Patient Instructions (Signed)
Your Plan:  Continue Aricept  Blood work today If your symptoms worsen or you develop new symptoms please let us know.   Thank you for coming to see [place] at [facility]. I hope we have been able to provide you high quality care today.  You may receive a patient satisfaction survey over the next few weeks. We would appreciate your feedback and comments so that we may continue to improve ourselves and the health of our patients.

## 2017-10-28 ENCOUNTER — Other Ambulatory Visit: Payer: Self-pay | Admitting: Family Medicine

## 2017-10-28 ENCOUNTER — Telehealth: Payer: Self-pay | Admitting: *Deleted

## 2017-10-28 LAB — COMPREHENSIVE METABOLIC PANEL
ALT: 24 IU/L (ref 0–44)
AST: 19 IU/L (ref 0–40)
Albumin/Globulin Ratio: 1.5 (ref 1.2–2.2)
Albumin: 4.4 g/dL (ref 3.6–4.8)
Alkaline Phosphatase: 134 IU/L — ABNORMAL HIGH (ref 39–117)
BUN/Creatinine Ratio: 10 (ref 10–24)
BUN: 10 mg/dL (ref 8–27)
Bilirubin Total: 0.5 mg/dL (ref 0.0–1.2)
CO2: 24 mmol/L (ref 20–29)
Calcium: 9.7 mg/dL (ref 8.6–10.2)
Chloride: 104 mmol/L (ref 96–106)
Creatinine, Ser: 1.01 mg/dL (ref 0.76–1.27)
GFR calc Af Amer: 89 mL/min/{1.73_m2} (ref >59)
GFR calc non Af Amer: 77 mL/min/{1.73_m2} (ref >59)
Globulin, Total: 3 g/dL (ref 1.5–4.5)
Glucose: 81 mg/dL (ref 65–99)
Potassium: 4.4 mmol/L (ref 3.5–5.2)
Sodium: 143 mmol/L (ref 134–144)
Total Protein: 7.4 g/dL (ref 6.0–8.5)

## 2017-10-28 LAB — URINALYSIS, ROUTINE W REFLEX MICROSCOPIC
Bilirubin, UA: NEGATIVE
Glucose, UA: NEGATIVE
Ketones, UA: NEGATIVE
Leukocytes, UA: NEGATIVE
Nitrite, UA: NEGATIVE
Protein, UA: NEGATIVE
RBC, UA: NEGATIVE
Specific Gravity, UA: 1.02 (ref 1.005–1.030)
Urobilinogen, Ur: 1 mg/dL (ref 0.2–1.0)
pH, UA: 8 — ABNORMAL HIGH (ref 5.0–7.5)

## 2017-10-28 LAB — CBC WITH DIFFERENTIAL/PLATELET
Basophils Absolute: 0 10*3/uL (ref 0.0–0.2)
Basos: 1 %
EOS (ABSOLUTE): 0.1 10*3/uL (ref 0.0–0.4)
Eos: 2 %
Hematocrit: 43.4 % (ref 37.5–51.0)
Hemoglobin: 14 g/dL (ref 13.0–17.7)
Immature Grans (Abs): 0 10*3/uL (ref 0.0–0.1)
Immature Granulocytes: 0 %
Lymphocytes Absolute: 1.7 10*3/uL (ref 0.7–3.1)
Lymphs: 44 %
MCH: 28.5 pg (ref 26.6–33.0)
MCHC: 32.3 g/dL (ref 31.5–35.7)
MCV: 88 fL (ref 79–97)
Monocytes Absolute: 0.3 10*3/uL (ref 0.1–0.9)
Monocytes: 8 %
Neutrophils Absolute: 1.7 10*3/uL (ref 1.4–7.0)
Neutrophils: 45 %
Platelets: 230 10*3/uL (ref 150–379)
RBC: 4.91 x10E6/uL (ref 4.14–5.80)
RDW: 14.6 % (ref 12.3–15.4)
WBC: 3.7 10*3/uL (ref 3.4–10.8)

## 2017-10-28 NOTE — Telephone Encounter (Signed)
LVM informing wife, Marvin [name] on [place] that patient's lab work is unremarkable. Alkaline phosphatase (liver enzyme) is slightly elevated at 134. Marvin [name] will continue to monitor that. Advised Marvin [name] recommends that the caregiver use nonpharmacological ways as discussed in the office visit to control the patient's behavior and mood. However if this is not manageable, please call [facility] and Marvin [name] can consider medication. Left office number for any questions.

## 2017-10-29 NOTE — Telephone Encounter (Signed)
Seen 3 1 18

## 2017-11-02 ENCOUNTER — Telehealth: Payer: Self-pay | Admitting: *Deleted

## 2017-11-02 NOTE — Telephone Encounter (Signed)
Called wife to advise that if she had an appointment for patient to come in this Wed, she can disregard. Ed[address] NP saw pt last week. She stated she was going to call and ask. She then stated he is scheduled for 6 month FU in May, but has appt in Feb. This RN advised Feb appt will be canceled, but advised she call before May with any concerns, questions. Wife verbalized understanding, appreciation.

## 2017-11-04 ENCOUNTER — Ambulatory Visit: Payer: Self-pay | Admitting: Adult Health

## 2017-11-14 ENCOUNTER — Other Ambulatory Visit: Payer: Self-pay | Admitting: Family Medicine

## 2017-12-11 ENCOUNTER — Telehealth: Payer: Self-pay | Admitting: Family Medicine

## 2017-12-11 NOTE — Telephone Encounter (Signed)
I have attempted to  Call the pt several times with no result on speaking with anyone.

## 2018-01-21 ENCOUNTER — Ambulatory Visit: Payer: Medicare Other | Admitting: Adult Health

## 2018-02-18 DIAGNOSIS — R69 Illness, unspecified: Secondary | ICD-10-CM | POA: Diagnosis not present

## 2018-02-18 DIAGNOSIS — I10 Essential (primary) hypertension: Secondary | ICD-10-CM | POA: Diagnosis not present

## 2018-02-18 DIAGNOSIS — E785 Hyperlipidemia, unspecified: Secondary | ICD-10-CM | POA: Diagnosis not present

## 2018-02-18 DIAGNOSIS — Z823 Family history of stroke: Secondary | ICD-10-CM | POA: Diagnosis not present

## 2018-02-18 DIAGNOSIS — Z87891 Personal history of nicotine dependence: Secondary | ICD-10-CM | POA: Diagnosis not present

## 2018-02-18 DIAGNOSIS — Z833 Family history of diabetes mellitus: Secondary | ICD-10-CM | POA: Diagnosis not present

## 2018-03-12 ENCOUNTER — Other Ambulatory Visit: Payer: Self-pay | Admitting: Family Medicine

## 2018-04-05 ENCOUNTER — Ambulatory Visit (INDEPENDENT_AMBULATORY_CARE_PROVIDER_SITE_OTHER): Payer: Medicare HMO | Admitting: Family Medicine

## 2018-04-05 ENCOUNTER — Encounter: Payer: Self-pay | Admitting: Family Medicine

## 2018-04-05 VITALS — BP 140/80 | HR 70 | Resp 16 | Ht 70.0 in | Wt 221.0 lb

## 2018-04-05 DIAGNOSIS — Z125 Encounter for screening for malignant neoplasm of prostate: Secondary | ICD-10-CM | POA: Diagnosis not present

## 2018-04-05 DIAGNOSIS — E8881 Metabolic syndrome: Secondary | ICD-10-CM

## 2018-04-05 DIAGNOSIS — I10 Essential (primary) hypertension: Secondary | ICD-10-CM

## 2018-04-05 DIAGNOSIS — E669 Obesity, unspecified: Secondary | ICD-10-CM

## 2018-04-05 DIAGNOSIS — E785 Hyperlipidemia, unspecified: Secondary | ICD-10-CM

## 2018-04-05 DIAGNOSIS — E790 Hyperuricemia without signs of inflammatory arthritis and tophaceous disease: Secondary | ICD-10-CM | POA: Diagnosis not present

## 2018-04-05 DIAGNOSIS — E559 Vitamin D deficiency, unspecified: Secondary | ICD-10-CM | POA: Diagnosis not present

## 2018-04-05 DIAGNOSIS — R7303 Prediabetes: Secondary | ICD-10-CM | POA: Diagnosis not present

## 2018-04-05 DIAGNOSIS — Z23 Encounter for immunization: Secondary | ICD-10-CM | POA: Diagnosis not present

## 2018-04-05 MED ORDER — FUROSEMIDE 20 MG PO TABS: 20.0000 mg | ORAL_TABLET | Freq: Every day | ORAL | 3 refills | Status: DC

## 2018-04-05 NOTE — Patient Instructions (Addendum)
Welcome to medicare in next 1 to 2 weeks with MD if possible, if not initial medicare wellness  4 months with nurse  Annual physical exam with mD in 3 months  Pneumonia 23 today  New ADDITIONAL MED LASIX 20 MG ONE DAILY  Labs today cmp and EGFR, lipid, HBA1C tsh and PSA and vit D and uric acid level  [facility] / [facility]  Also start gardening  Take tylenol ES one twice daily for the next 4 days

## 2018-04-06 LAB — COMPLETE METABOLIC PANEL WITH GFR
AG Ratio: 1.3 (calc) (ref 1.0–2.5)
ALT: 16 U/L (ref 9–46)
AST: 16 U/L (ref 10–35)
Albumin: 3.9 g/dL (ref 3.6–5.1)
Alkaline phosphatase (APISO): 122 U/L — ABNORMAL HIGH (ref 40–115)
BUN: 16 mg/dL (ref 7–25)
CO2: 29 mmol/L (ref 20–32)
Calcium: 9.1 mg/dL (ref 8.6–10.3)
Chloride: 105 mmol/L (ref 98–110)
Creat: 1.03 mg/dL (ref 0.70–1.25)
GFR, Est African American: 87 mL/min/{1.73_m2} (ref >=60)
GFR, Est Non African American: 75 mL/min/{1.73_m2} (ref >=60)
Globulin: 3.1 g/dL (calc) (ref 1.9–3.7)
Glucose, Bld: 111 mg/dL — ABNORMAL HIGH (ref 65–99)
Potassium: 4.3 mmol/L (ref 3.5–5.3)
Sodium: 139 mmol/L (ref 135–146)
Total Bilirubin: 0.6 mg/dL (ref 0.2–1.2)
Total Protein: 7 g/dL (ref 6.1–8.1)

## 2018-04-06 LAB — HEMOGLOBIN A1C
Hgb A1c MFr Bld: 5.9 % of total Hgb — ABNORMAL HIGH (ref <5.7)
Mean Plasma Glucose: 123 (calc)
eAG (mmol/L): 6.8 (calc)

## 2018-04-06 LAB — VITAMIN D 25 HYDROXY (VIT D DEFICIENCY, FRACTURES): Vit D, 25-Hydroxy: 35 ng/mL (ref 30–100)

## 2018-04-06 LAB — LIPID PANEL
Cholesterol: 222 mg/dL — ABNORMAL HIGH (ref <200)
HDL: 48 mg/dL (ref >40)
LDL Cholesterol (Calc): 156 mg/dL (calc) — ABNORMAL HIGH
Non-HDL Cholesterol (Calc): 174 mg/dL (calc) — ABNORMAL HIGH (ref <130)
Total CHOL/HDL Ratio: 4.6 (calc) (ref <5.0)
Triglycerides: 79 mg/dL (ref <150)

## 2018-04-06 LAB — PSA: PSA: 0.5 ng/mL (ref <=4.0)

## 2018-04-06 LAB — URIC ACID: Uric Acid, Serum: 8.2 mg/dL — ABNORMAL HIGH (ref 4.0–8.0)

## 2018-04-06 LAB — TSH: TSH: 0.75 mIU/L (ref 0.40–4.50)

## 2018-04-07 ENCOUNTER — Other Ambulatory Visit: Payer: Self-pay | Admitting: Family Medicine

## 2018-04-07 MED ORDER — ATORVASTATIN CALCIUM 40 MG PO TABS: 40.0000 mg | ORAL_TABLET | Freq: Every day | ORAL | 5 refills | Status: DC

## 2018-04-07 MED ORDER — ALLOPURINOL 300 MG PO TABS: 300.0000 mg | ORAL_TABLET | Freq: Every day | ORAL | 6 refills | Status: DC

## 2018-04-13 DIAGNOSIS — R7303 Prediabetes: Secondary | ICD-10-CM | POA: Insufficient documentation

## 2018-04-13 DIAGNOSIS — E790 Hyperuricemia without signs of inflammatory arthritis and tophaceous disease: Secondary | ICD-10-CM | POA: Insufficient documentation

## 2018-04-13 NOTE — Assessment & Plan Note (Signed)
Ankle swelling and pain with hyperuricemia, resume allopurinol and avoid foods which aggravate gout

## 2018-04-13 NOTE — Assessment & Plan Note (Signed)
Patient educated about the importance of limiting  Carbohydrate intake , the need to commit to daily physical activity for a minimum of 30 minutes , and to commit weight loss. The fact that changes in all these areas will reduce or eliminate all together the development of diabetes is stressed.  Deteriorated needs to work on dietary change Diabetic Labs Latest Ref Rng & Units 04/05/2018 10/27/2017 01/12/2017 05/29/2016 09/29/2015  HbA1c <5.7 % of total Hgb 5.9(H) - 5.7(H) 6.2(H) -  Chol <200 mg/dL 222(H) - 194 230(H) -  HDL >40 mg/dL 48 - 67 68 -  Calc LDL mg/dL (calc) 156(H) - 110(H) 152(H) -  Triglycerides <150 mg/dL 79 - 84 49 -  Creatinine 0.70 - 1.25 mg/dL 1.03 1.01 1.08 1.10 1.08   BP/Weight 04/05/2018 10/27/2017 07/21/2017 02/12/2017 01/13/2017 01/12/2017 0/13/1438  Systolic BP 887 579 728 206 015 615 379  Diastolic BP 80 94 77 82 83 84 80  Wt. (Lbs) 221 212.4 211.8 206 207.5 206 195  BMI 31.71 30.48 30.39 30.42 30.64 30.42 28.8   No flowsheet data found.

## 2018-04-13 NOTE — Assessment & Plan Note (Signed)
Hyperlipidemia:Low fat diet discussed and encouraged.   Lipid Panel  Lab Results  Component Value Date   CHOL 222 (H) 04/05/2018   HDL 48 04/05/2018   LDLCALC 156 (H) 04/05/2018   TRIG 79 04/05/2018   CHOLHDL 4.6 04/05/2018   Needs to lower fat intake and medication may need to be added or increased will need to verify dosage when he is contacted since lab results are available after the visit

## 2018-04-13 NOTE — Assessment & Plan Note (Signed)
Not at goal, no med change, lifestyle modification with weight loss and exercise at this time DASH diet and commitment to daily physical activity for a minimum of 30 minutes discussed and encouraged, as a part of hypertension management. The importance of attaining a healthy weight is also discussed.  BP/Weight 04/05/2018 10/27/2017 07/21/2017 02/12/2017 01/13/2017 01/12/2017 0/72/2575  Systolic BP 051 833 582 518 984 210 312  Diastolic BP 80 94 77 82 83 84 80  Wt. (Lbs) 221 212.4 211.8 206 207.5 206 195  BMI 31.71 30.48 30.39 30.42 30.64 30.42 28.8

## 2018-04-13 NOTE — Assessment & Plan Note (Signed)
Deteriorated. Patient re-educated about  the importance of commitment to a  minimum of 150 minutes of exercise per week.  The importance of healthy food choices with portion control discussed. Encouraged to start a food diary, count calories and to consider  joining a support group. Sample diet sheets offered. Goals set by the patient for the next several months.   Weight /BMI 04/05/2018 10/27/2017 07/21/2017  WEIGHT 221 lb 212 lb 6.4 oz 211 lb 12.8 oz  HEIGHT 5\' 10"  - 5\' 10"   BMI 31.71 kg/m2 30.48 kg/m2 30.39 kg/m2

## 2018-04-13 NOTE — Progress Notes (Signed)
Marvin [name]     MRN: [id number]      DOB: 07/27/1951   HPI Marvin [name] is here for follow up and re-evaluation of chronic medical conditions, medication management and review of any available recent lab and radiology data.  Preventive health is updated, specifically  Cancer screening and Immunization.    The PT denies any adverse reactions to current medications since the last visit.  Wife who accompanies im states he does increasingly less in the home, his main activity is to sit and watch TV all day long, no longer gardens or plays golf and does very little housework. He is gaining weight due to inactivity also. No behavioral issues thankfully, but tends to become defensive when she suggests that he does anything   ROS Denies recent fever or chills. Denies sinus pressure, nasal congestion, ear pain or sore throat. Denies chest congestion, productive cough or wheezing. Denies chest pains, palpitations and leg swelling Denies abdominal pain, nausea, vomiting,diarrhea or constipation.   Denies dysuria, frequency, hesitancy or incontinence. 1 week h/o left foot and ankle pain and swelling with no recent trauma, has  Past h/o gout. Denies headaches, seizures, numbness, or tingling. Denies depression, anxiety or insomnia. Denies skin break down or rash.   PE  BP 140/80   Pulse 70   Resp 16   Ht 5\' 10"  (1.778 m)   Wt 221 lb (100.2 kg)   SpO2 97%   BMI 31.71 kg/m   Patient alert and oriented and in no cardiopulmonary distress.  HEENT: No facial asymmetry, EOMI,   oropharynx pink and moist.  Neck supple no JVD, no mass.  Chest: Clear to auscultation bilaterally.  CVS: S1, S2 no murmurs, no S3.Regular rate.  ABD: Soft non tender.   Ext: No edema  MS: Adequate ROM spine, shoulders, hips and knees.decreased ROM left foot with tenderness and swelling of the left ankle Skin: Intact, no ulcerations or rash noted.  Psych: Good eye contact, blunted l affect. Memory impaired   not anxious or depressed appearing.  CNS: CN 2-12 intact, power,  normal throughout.no focal deficits noted.   Assessment & Plan  Hypertension Not at goal, no med change, lifestyle modification with weight loss and exercise at this time DASH diet and commitment to daily physical activity for a minimum of 30 minutes discussed and encouraged, as a part of hypertension management. The importance of attaining a healthy weight is also discussed.  BP/Weight 04/05/2018 10/27/2017 07/21/2017 02/12/2017 01/13/2017 01/12/2017 9/70/2637  Systolic BP 858 850 277 412 878 676 720  Diastolic BP 80 94 77 82 83 84 80  Wt. (Lbs) 221 212.4 211.8 206 207.5 206 195  BMI 31.71 30.48 30.39 30.42 30.64 30.42 28.8       Hyperlipidemia Hyperlipidemia:Low fat diet discussed and encouraged.   Lipid Panel  Lab Results  Component Value Date   CHOL 222 (H) 04/05/2018   HDL 48 04/05/2018   LDLCALC 156 (H) 04/05/2018   TRIG 79 04/05/2018   CHOLHDL 4.6 04/05/2018   Needs to lower fat intake and medication may need to be added or increased will need to verify dosage when he is contacted since lab results are available after the visit   Obesity (BMI 30.0-34.9) Deteriorated. Patient re-educated about  the importance of commitment to a  minimum of 150 minutes of exercise per week.  The importance of healthy food choices with portion control discussed. Encouraged to start a food diary, count calories and to consider  joining  a support group. Sample diet sheets offered. Goals set by the patient for the next several months.   Weight /BMI 04/05/2018 10/27/2017 07/21/2017  WEIGHT 221 lb 212 lb 6.4 oz 211 lb 12.8 oz  HEIGHT 5\' 10"  - 5\' 10"   BMI 31.71 kg/m2 30.48 kg/m2 30.39 kg/m2      Metabolic syndrome X The increased risk of cardiovascular disease associated with this diagnosis, and the need to consistently work on lifestyle to change this is discussed. Following  a  heart healthy diet ,commitment to 30 minutes  of exercise at least 5 days per week, as well as control of blood sugar and cholesterol , and achieving a healthy weight are all the areas to be addressed .   Prediabetes Patient educated about the importance of limiting  Carbohydrate intake , the need to commit to daily physical activity for a minimum of 30 minutes , and to commit weight loss. The fact that changes in all these areas will reduce or eliminate all together the development of diabetes is stressed.  Deteriorated needs to work on dietary change Diabetic Labs Latest Ref Rng & Units 04/05/2018 10/27/2017 01/12/2017 05/29/2016 09/29/2015  HbA1c <5.7 % of total Hgb 5.9(H) - 5.7(H) 6.2(H) -  Chol <200 mg/dL 222(H) - 194 230(H) -  HDL >40 mg/dL 48 - 67 68 -  Calc LDL mg/dL (calc) 156(H) - 110(H) 152(H) -  Triglycerides <150 mg/dL 79 - 84 49 -  Creatinine 0.70 - 1.25 mg/dL 1.03 1.01 1.08 1.10 1.08   BP/Weight 04/05/2018 10/27/2017 07/21/2017 02/12/2017 01/13/2017 01/12/2017 2/99/2426  Systolic BP 834 196 222 979 892 119 417  Diastolic BP 80 94 77 82 83 84 80  Wt. (Lbs) 221 212.4 211.8 206 207.5 206 195  BMI 31.71 30.48 30.39 30.42 30.64 30.42 28.8   No flowsheet data found.    Hyperuricemia Ankle swelling and pain with hyperuricemia, resume allopurinol and avoid foods which aggravate gout

## 2018-04-13 NOTE — Assessment & Plan Note (Signed)
The increased risk of cardiovascular disease associated with this diagnosis, and the need to consistently work on lifestyle to change this is discussed. Following  a  heart healthy diet ,commitment to 30 minutes of exercise at least 5 days per week, as well as control of blood sugar and cholesterol , and achieving a healthy weight are all the areas to be addressed .  

## 2018-04-27 ENCOUNTER — Encounter: Payer: Self-pay | Admitting: Adult Health

## 2018-04-27 ENCOUNTER — Ambulatory Visit: Payer: Medicare HMO | Admitting: Adult Health

## 2018-04-27 VITALS — BP 160/88 | HR 60 | Ht 70.0 in | Wt 221.6 lb

## 2018-04-27 DIAGNOSIS — F0390 Unspecified dementia without behavioral disturbance: Secondary | ICD-10-CM | POA: Diagnosis not present

## 2018-04-27 DIAGNOSIS — R69 Illness, unspecified: Secondary | ICD-10-CM | POA: Diagnosis not present

## 2018-04-27 MED ORDER — DONEPEZIL HCL 10 MG PO TABS: 10.0000 mg | ORAL_TABLET | Freq: Every day | ORAL | 3 refills | Status: DC

## 2018-04-27 MED ORDER — MEMANTINE HCL 28 X 5 MG & 21 X 10 MG PO TABS: ORAL_TABLET | ORAL | 0 refills | Status: DC

## 2018-04-27 NOTE — Patient Instructions (Addendum)
Your Plan:  Continue Aricept Memory score has declined Start Namenda titration pack- at the end of week 4 call for maintenance dose Engage in social activities, word puzzles and word searches If your symptoms worsen or you develop new symptoms please let us know.   Thank you for coming to see [place] at [facility]. I hope we have been able to provide you high quality care today.  You may receive a patient satisfaction survey over the next few weeks. We would appreciate your feedback and comments so that we may continue to improve ourselves and the health of our patients.   Memantine Tablets What is this medicine? MEMANTINE (MEM an teen) is used to treat dementia caused by Alzheimer's disease. This medicine may be used for other purposes; ask your health care provider or pharmacist if you have questions. COMMON BRAND NAME(S): Namenda What should I tell my health care provider before I take this medicine? They need to know if you have any of these conditions: -difficulty passing urine -kidney disease -liver disease -seizures -an unusual or allergic reaction to memantine, other medicines, foods, dyes, or preservatives -pregnant or trying to get pregnant -breast-feeding How should I use this medicine? Take this medicine by mouth with a glass of water. Follow the directions on the prescription label. You may take this medicine with or without food. Take your doses at regular intervals. Do not take your medicine more often than directed. Continue to take your medicine even if you feel better. Do not stop taking except on the advice of your doctor or health care professional. Talk to your pediatrician regarding the use of this medicine in children. Special care may be needed. Overdosage: If you think you have taken too much of this medicine contact a poison control center or emergency room at once. NOTE: This medicine is only for you. Do not share this medicine with  others. What if I miss a dose? If you miss a dose, take it as soon as you can. If it is almost time for your next dose, take only that dose. Do not take double or extra doses. If you do not take your medicine for several days, contact your health care provider. Your dose may need to be changed. What may interact with this medicine? -acetazolamide -amantadine -cimetidine -dextromethorphan -dofetilide -hydrochlorothiazide -ketamine -metformin -methazolamide -quinidine -ranitidine -sodium bicarbonate -triamterene This list may not describe all possible interactions. Give your health care provider a list of all the medicines, herbs, non-prescription drugs, or dietary supplements you use. Also tell them if you smoke, drink alcohol, or use illegal drugs. Some items may interact with your medicine. What should I watch for while using this medicine? Visit your doctor or health care professional for regular checks on your progress. Check with your doctor or health care professional if there is no improvement in your symptoms or if they get worse. You may get drowsy or dizzy. Do not drive, use machinery, or do anything that needs mental alertness until you know how this drug affects you. Do not stand or sit up quickly, especially if you are an older patient. This reduces the risk of dizzy or fainting spells. Alcohol can make you more drowsy and dizzy. Avoid alcoholic drinks. What side effects may I notice from receiving this medicine? Side effects that you should report to your doctor or health care professional as soon as possible: -allergic reactions like skin rash, itching or hives, swelling of the face, lips, or tongue -agitation or  a feeling of restlessness -depressed mood -dizziness -hallucinations -redness, blistering, peeling or loosening of the skin, including inside the mouth -seizures -vomiting Side effects that usually do not require medical attention (report to your doctor or health  care professional if they continue or are bothersome): -constipation -diarrhea -headache -nausea -trouble sleeping This list may not describe all possible side effects. Call your doctor for medical advice about side effects. You may report side effects to FDA at 1-800-FDA-1088. Where should I keep my medicine? Keep out of the reach of children. Store at room temperature between 15 degrees and 30 degrees C (59 degrees and 86 degrees F). Throw away any unused medicine after the expiration date. NOTE: This sheet is a summary. It may not cover all possible information. If you have questions about this medicine, talk to your doctor, pharmacist, or health care provider.  2018 Elsevier/Gold Standard (2013-09-19 14:10:42)

## 2018-04-27 NOTE — Progress Notes (Signed)
PATIENT: Marvin [name] DOB: [birth year]  REASON FOR VISIT: follow up HISTORY FROM: patient  HISTORY OF PRESENT ILLNESS: Today 04/27/18:  Marvin [name] is a 67 year old male with a history of memory disturbance. he returns today for follow-up.  He is here with his wife.  She feels that his memory has gotten worse.  Patient reports that he is able to complete all ADLs independently.  He no longer operates a Teacher, music.  His wife manages all the finances.  His wife states that he used to take pride in doing yard work but he is no longer interested in this.  Denies any trouble sleeping however she reports that she works at night so she is not there with him.  She reports that her mother lives in the home and is there during the night.  She states that she tries to take the patient out during the day but most the time he wants to come back home.  She denies any significant changes in his behavior or mood.  He continues to confuse what he sees on TV with reality.  She states that he still enjoys going outside and hitting golf balls.  He remains on Aricept 10 mg daily.  Patient returns today for an evaluation.  HISTORY: 10/27/17  Marvin [name] is a 67 year old male with a history of memory disturbance.  He returns today for follow-up.  He remains on Aricept 10 mg daily.  I spoke to his wife prior to the visit.  She states that in the last 2-3 months his behavior has changed.  She states on 2 occasions he has left in the middle the night to go fix a family members bathroom.  She states that the bathroom is not broken.  She states that he also feels that when the TV is on whoever is talking is speaking directly to him.  She also states that he makes comments that those famous people on TV he knows personally.  As in he used to live next door to them. He has also been telling his wife that he bought a truck which he has not.  His wife states that she no longer lets him drive because he turned out in front  of a vehicle.  She denies any recent change in his medications.  She reports that he is able to complete all ADLs independently.  He does not cook on the stove but does use the microwave.  The wife handles the finances.  She reports that she does not let him carry any type of credit card or debit card.  She states that she has also noticed that he has been more aggressive toward her.  She states that he raises his voice whereas before he used to never do that.  He returns today for an evaluation.    REVIEW OF SYSTEMS: Out of a complete 14 system review of symptoms, the patient complains only of the following symptoms, and all other reviewed systems are negative.  Memory loss, behavior, confusion  Lawton-Brody Score: 3 Katz Score: 6  ALLERGIES: Allergies  Allergen Reactions  . Aspirin   . Naprosyn [Naproxen]   . Penicillins     HOME MEDICATIONS: Outpatient Medications Prior to Visit  Medication Sig Dispense Refill  . allopurinol (ZYLOPRIM) 300 MG tablet Take 1 tablet (300 mg total) by mouth daily. 30 tablet 6  . amLODipine (NORVASC) 10 MG tablet TAKE 1 TABLET BY MOUTH ONCE DAILY 90 tablet 1  . atorvastatin (LIPITOR)  40 MG tablet Take 1 tablet (40 mg total) by mouth daily. 30 tablet 5  . benazepril (LOTENSIN) 40 MG tablet TAKE 1 TABLET BY MOUTH ONCE DAILY 90 tablet 1  . cloNIDine (CATAPRES) 0.2 MG tablet TAKE 1 TABLET BY MOUTH THREE TIMES DAILY 270 tablet 1  . donepezil (ARICEPT) 10 MG tablet Take 1 tablet (10 mg total) by mouth at bedtime. 90 tablet 4  . furosemide (LASIX) 20 MG tablet Take 1 tablet (20 mg total) by mouth daily. 30 tablet 3  . Vitamin D, Ergocalciferol, (DRISDOL) 50000 units CAPS capsule TAKE 1 CAPSULE BY MOUTH ONCE A WEEK (EVERY  7  DAYS) 12 capsule 0   No facility-administered medications prior to visit.     PAST MEDICAL HISTORY: Past Medical History:  Diagnosis Date  . Degenerative joint disease   . Dementia   . Gout   . Hemorrhoids    Dr. Romona Curls  .  Hyperlipidemia   . Hypertension   . Impaired glucose tolerance   . Premature atrial contractions     PAST SURGICAL HISTORY: Past Surgical History:  Procedure Laterality Date  . COLONOSCOPY  07/2004  . COLONOSCOPY N/A 09/22/2014   Procedure: COLONOSCOPY;  Surgeon: Daneil Dolin, MD;  Location: [facility];  Service: Endoscopy;  Laterality: N/A;  9:30 AM        PT REQUEST TIME  . KNEE ARTHROSCOPY     Right    FAMILY HISTORY: Family History  Problem Relation Age of Onset  . Alzheimer's disease Mother   . Diabetes Father   . Heart failure Father        prostate disease   . Alzheimer's disease Sister   . Kidney failure Brother   . Diabetes Brother   . High blood pressure Brother   . Cancer Brother     SOCIAL HISTORY: Social History   Socioeconomic History  . Marital status: Married    Spouse name: Not on file  . Number of children: 1  . Years of education: 17  . Highest education level: Not on file  Occupational History  . Occupation: Systems developer: Probation officer  Social Needs  . Financial resource strain: Not on file  . Food insecurity:    Worry: Not on file    Inability: Not on file  . Transportation needs:    Medical: Not on file    Non-medical: Not on file  Tobacco Use  . Smoking status: Former Smoker    Last attempt to quit: 1992    Years since quitting: 27.3  . Smokeless tobacco: Never Used  Substance and Sexual Activity  . Alcohol use: No    Comment: Quit 1992  . Drug use: No  . Sexual activity: Not on file    Comment: Married  Lifestyle  . Physical activity:    Days per week: Not on file    Minutes per session: Not on file  . Stress: Not on file  Relationships  . Social connections:    Talks on phone: Not on file    Gets together: Not on file    Attends religious service: Not on file    Active member of club or organization: Not on file    Attends meetings of clubs or organizations: Not on file    Relationship status: Not  on file  . Intimate partner violence:    Fear of current or ex partner: Not on file    Emotionally abused: Not on  file    Physically abused: Not on file    Forced sexual activity: Not on file  Other Topics Concern  . Not on file  Social History Narrative   Lives at home w/ his wife   Right-handed   Caffeine: 1 cup daily   Education- assoc degree      PHYSICAL EXAM  Vitals:   04/27/18 1038  BP: (!) 160/88  Pulse: 60  Weight: 221 lb 9.6 oz (100.5 kg)  Height: 5\' 10"  (1.778 m)   Body mass index is 31.8 kg/m.   MMSE - Mini Mental State Exam 04/27/2018 10/27/2017 07/21/2017  Orientation to time 0 2 1  Orientation to Place 3 4 2   Registration 3 3 3   Attention/ Calculation 0 1 5  Recall 0 0 0  Language- name 2 objects 2 2 2   Language- repeat 0 0 1  Language- follow 3 step command 1 3 3   Language- follow 3 step command-comments used L hand then switched it to R, did not drop on floor - -  Language- read & follow direction 0 0 1  Language-read & follow direction-comments spelled each word read it backwards -  Write a sentence 0 1 1  Copy design 1 1 1   Total score 10 17 20      Generalized: Well developed, in no acute distress   Neurological examination  Mentation: Alert. Follows all commands speech and language fluent Cranial nerve II-XII: Pupils were equal round reactive to light. Extraocular movements were full, visual field were full on confrontational test. Facial sensation and strength were normal. Uvula tongue midline. Head turning and shoulder shrug  were normal and symmetric. Motor: The motor testing reveals 5 over 5 strength of all 4 extremities. Good symmetric motor tone is noted throughout.  Sensory: Sensory testing is intact to soft touch on all 4 extremities. No evidence of extinction is noted.  Coordination: Cerebellar testing reveals good finger-nose-finger and heel-to-shin bilaterally.  Have to repeat directions several times and demonstrate the patient Gait  and station: Gait is normal 1.  Memory disturbance Reflexes: Deep tendon reflexes are symmetric and normal bilaterally.   DIAGNOSTIC DATA (LABS, IMAGING, TESTING) - I reviewed patient records, labs, notes, testing and imaging myself where available.  Lab Results  Component Value Date   WBC 3.7 10/27/2017   HGB 14.0 10/27/2017   HCT 43.4 10/27/2017   MCV 88 10/27/2017   PLT 230 10/27/2017      Component Value Date/Time   NA 139 04/05/2018 0904   NA 143 10/27/2017 1036   K 4.3 04/05/2018 0904   CL 105 04/05/2018 0904   CO2 29 04/05/2018 0904   GLUCOSE 111 (H) 04/05/2018 0904   BUN 16 04/05/2018 0904   BUN 10 10/27/2017 1036   CREATININE 1.03 04/05/2018 0904   CALCIUM 9.1 04/05/2018 0904   PROT 7.0 04/05/2018 0904   PROT 7.4 10/27/2017 1036   ALBUMIN 4.4 10/27/2017 1036   AST 16 04/05/2018 0904   ALT 16 04/05/2018 0904   ALKPHOS 134 (H) 10/27/2017 1036   BILITOT 0.6 04/05/2018 0904   BILITOT 0.5 10/27/2017 1036   GFRNONAA 75 04/05/2018 0904   GFRAA 87 04/05/2018 0904   Lab Results  Component Value Date   CHOL 222 (H) 04/05/2018   HDL 48 04/05/2018   LDLCALC 156 (H) 04/05/2018   TRIG 79 04/05/2018   CHOLHDL 4.6 04/05/2018   Lab Results  Component Value Date   HGBA1C 5.9 (H) 04/05/2018   Lab Results  Component Value Date   VITAMINB12 256 01/13/2017   Lab Results  Component Value Date   TSH 0.75 04/05/2018      ASSESSMENT AND PLAN 67 y.o. year old male  has a past medical history of Degenerative joint disease, Dementia, Gout, Hemorrhoids, Hyperlipidemia, Hypertension, Impaired glucose tolerance, and Premature atrial contractions. here with:  1.  Memory disturbance  The patient's memory score has declined since the last visit.  The patient will remain on Aricept 10 mg daily.  I will start a Namenda titration pack.  I have advised the wife that at the beginning of week four she will need to call for the maintenance prescription.  I have reviewed potential side  effects as well as provided the patient and his wife with a handout.  I have advised that if his symptoms worsen or he develops new symptoms they should let us know.  He will follow-up in 6 months or sooner if needed.  I spent 15 minutes with the patient. 50% of this time was spent discussing his memory score and medication- namenda.  Ward Givens, MSN, NP-C 04/27/2018, 10:48 AM [facility] [address], [place], [place] [address] [phone]

## 2018-04-27 NOTE — Progress Notes (Signed)
I have read the note, and I agree with the clinical assessment and plan.  Rayder Sullenger K Brittaney Beaulieu   

## 2018-05-17 ENCOUNTER — Telehealth: Payer: Self-pay | Admitting: Adult Health

## 2018-05-17 NOTE — Telephone Encounter (Signed)
I spoke to wife.  Pt is on week 2 of titration of memantine for dementia.  He locked himself out of the back door and could not get back in.  He has shown her some agitiation, and aggression, she states she is scared of him at times.  Her mother lives with her but she has no support.  I told her that I would mail information to her, and let Jinny Blossom know about his agitation/speaking unkindly to her.  (nonphysical).  She has guns in the home, but she states they are not where he can get to them.  ? Disease or medication.  She will continue the memantine.  I told her about [email or web address]

## 2018-05-17 NOTE — Telephone Encounter (Signed)
Namenda sometimes can help with agitation as well. She may want to consider adult day center for him? If agitation does not improve she should let us know and we will consider another medication

## 2018-05-17 NOTE — Telephone Encounter (Signed)
Patient';s wife calling stating patient has one more week taking memantine (NAMENDA TITRATION PAK) tablet pack. Since taking this medication he has gotten worse. He gets very frustrated and confused. Please call and discuss because she is afraid what he might do.

## 2018-05-17 NOTE — Telephone Encounter (Signed)
I LMVM for Olin Hauser, that  namenda will help with agitation and to keep on this. Consider adult daycare for hi, if things do not improve to let us know.

## 2018-05-18 NOTE — Telephone Encounter (Signed)
Mailed dementia information.

## 2018-06-23 ENCOUNTER — Other Ambulatory Visit: Payer: Self-pay

## 2018-06-23 MED ORDER — BENAZEPRIL HCL 40 MG PO TABS: 40.0000 mg | ORAL_TABLET | Freq: Every day | ORAL | 1 refills | Status: DC

## 2018-08-05 ENCOUNTER — Encounter: Payer: Medicare HMO | Admitting: Family Medicine

## 2018-08-12 ENCOUNTER — Encounter: Payer: Self-pay | Admitting: Family Medicine

## 2018-10-13 ENCOUNTER — Telehealth: Payer: Self-pay | Admitting: Family Medicine

## 2018-10-13 NOTE — Telephone Encounter (Signed)
Per the Pt sp, she wants to put her husband in a [place] he is becoming to much for her to handle. She is looking at [facility], but not sure what the next steps are.

## 2018-10-13 NOTE — Telephone Encounter (Signed)
Needs BOT a wellness and physical exam in the office, recommend scheduling bOTH ( same day if more convenient for wife) so this can be addressed fully, has no appt in the system, plds contact her with appt nfo set up, thanks

## 2018-10-14 ENCOUNTER — Telehealth: Payer: Self-pay | Admitting: Adult Health

## 2018-10-14 NOTE — Telephone Encounter (Signed)
Spoke to wife.  Pt is having worsening dementia sx.  Some agitation, trys to get into car, locked himself out of house, bringin stray cat in house, carrying knife around (does not think he will hurt her, I told her to get knives, guns away from home or lock up).  If threatens her to call 911.  Wont listen to her.  She thought namenda was making him worse so stopped.  She has not family support, she works third shift.  (aunts live close by).  I gave her the care patrol # to see about assisting with other living arrangements.  I will look for earlier appt.  Would you consider other medication for him?

## 2018-10-14 NOTE — Telephone Encounter (Signed)
Pts wife Olin Hauser) requesting a call stating she would like to discuss the pt going on a new medication to help calm his nerves. Did not wish to discuss further with me.

## 2018-10-14 NOTE — Telephone Encounter (Signed)
Tried to call and was not able to LM VM full.

## 2018-10-14 NOTE — Telephone Encounter (Signed)
Called and spoke with Wife, she agrees to scheduling an appointment as soon as possible. I also spoke with Gwinda Passe and asked her to schedule Marvin [name] and called wife back with appointment details. She wants to discuss placing Marvin [name] in a nursing facility with you in private, away from [place].

## 2018-10-15 NOTE — Telephone Encounter (Signed)
Spoke to wife and made earlier appt for 10-19-18 at 19 arrival 1030.  She verbalized understanding.

## 2018-10-15 NOTE — Telephone Encounter (Signed)
Noted  

## 2018-10-19 ENCOUNTER — Encounter

## 2018-10-19 ENCOUNTER — Encounter: Payer: Self-pay | Admitting: Adult Health

## 2018-10-19 ENCOUNTER — Ambulatory Visit: Payer: Medicare HMO | Admitting: Adult Health

## 2018-10-19 VITALS — BP 160/78 | HR 69 | Ht 70.0 in | Wt 222.6 lb

## 2018-10-19 DIAGNOSIS — F0391 Unspecified dementia with behavioral disturbance: Secondary | ICD-10-CM | POA: Diagnosis not present

## 2018-10-19 DIAGNOSIS — R443 Hallucinations, unspecified: Secondary | ICD-10-CM

## 2018-10-19 DIAGNOSIS — R69 Illness, unspecified: Secondary | ICD-10-CM | POA: Diagnosis not present

## 2018-10-19 MED ORDER — QUETIAPINE FUMARATE 25 MG PO TABS: 25.0000 mg | ORAL_TABLET | Freq: Every day | ORAL | 5 refills | Status: DC

## 2018-10-19 NOTE — Progress Notes (Signed)
I have read the note, and I agree with the clinical assessment and plan.  Charles K Willis   

## 2018-10-19 NOTE — Patient Instructions (Addendum)
Your Plan:  Continue Aricept Start Seroquel 25 mg at bedtime If your symptoms worsen or you develop new symptoms please let us know.   Thank you for coming to see [place] at [facility]. I hope we have been able to provide you high quality care today.  You may receive a patient satisfaction survey over the next few weeks. We would appreciate your feedback and comments so that we may continue to improve ourselves and the health of our patients.  Quetiapine tablets What is this medicine? QUETIAPINE (kwe TYE a peen) is an antipsychotic. It is used to treat schizophrenia and bipolar disorder, also known as manic-depression. This medicine may be used for other purposes; ask your health care provider or pharmacist if you have questions. COMMON BRAND NAME(S): Seroquel What should I tell my health care provider before I take this medicine? They need to know if you have any of these conditions: -brain tumor or head injury -breast cancer -cataracts -diabetes -difficulty swallowing -heart disease -kidney disease -liver disease -low blood counts, like low white cell, platelet, or red cell counts -low blood pressure or dizziness when standing up -Parkinson's disease -previous heart attack -seizures -suicidal thoughts, plans, or attempt by you or a family member -thyroid disease -an unusual or allergic reaction to quetiapine, other medicines, foods, dyes, or preservatives -pregnant or trying to get pregnant -breast-feeding How should I use this medicine? Take this medicine by mouth. Swallow it with a drink of water. Follow the directions on the prescription label. If it upsets your stomach you can take it with food. Take your medicine at regular intervals. Do not take it more often than directed. Do not stop taking except on the advice of your doctor or health care professional. A special MedGuide will be given to you by the pharmacist with each prescription and refill. Be sure  to read this information carefully each time. Talk to your pediatrician regarding the use of this medicine in children. While this drug may be prescribed for children as young as 10 years for selected conditions, precautions do apply. Patients over age 80 years may have a stronger reaction to this medicine and need smaller doses. Overdosage: If you think you have taken too much of this medicine contact a poison control center or emergency room at once. NOTE: This medicine is only for you. Do not share this medicine with others. What if I miss a dose? If you miss a dose, take it as soon as you can. If it is almost time for your next dose, take only that dose. Do not take double or extra doses. What may interact with this medicine? Do not take this medicine with any of the following medications: -certain medicines for fungal infections like fluconazole, itraconazole, ketoconazole, posaconazole, voriconazole -cisapride -dofetilide -dronedarone -droperidol -grepafloxacin -halofantrine -phenothiazines like chlorpromazine, mesoridazine, thioridazine -pimozide -sparfloxacin -ziprasidone This medicine may also interact with the following medications: -alcohol -antiviral medicines for HIV or AIDS -certain medicines for blood pressure -certain medicines for depression, anxiety, or psychotic disturbances like haloperidol, lorazepam -certain medicines for diabetes -certain medicines for Parkinson's disease -certain medicines for seizures like carbamazepine, phenobarbital, phenytoin -cimetidine -erythromycin -other medicines that prolong the QT interval (cause an abnormal heart rhythm) -rifampin -steroid medicines like prednisone or cortisone This list may not describe all possible interactions. Give your health care provider a list of all the medicines, herbs, non-prescription drugs, or dietary supplements you use. Also tell them if you smoke, drink alcohol, or use illegal drugs. Some  items may  interact with your medicine. What should I watch for while using this medicine? Visit your doctor or health care professional for regular checks on your progress. It may be several weeks before you see the full effects of this medicine. Your health care provider may suggest that you have your eyes examined prior to starting this medicine, and every 6 months thereafter. If you have been taking this medicine regularly for some time, do not suddenly stop taking it. You must gradually reduce the dose or your symptoms may get worse. Ask your doctor or health care professional for advice. Patients and their families should watch out for worsening depression or thoughts of suicide. Also watch out for sudden or severe changes in feelings such as feeling anxious, agitated, panicky, irritable, hostile, aggressive, impulsive, severely restless, overly excited and hyperactive, or not being able to sleep. If this happens, especially at the beginning of antidepressant treatment or after a change in dose, call your health care professional. [name] may get dizzy or drowsy. Do not drive, use machinery, or do anything that needs mental alertness until you know how this medicine affects you. Do not stand or sit up quickly, especially if you are an older patient. This reduces the risk of dizzy or fainting spells. Alcohol can increase dizziness and drowsiness. Avoid alcoholic drinks. Do not treat yourself for colds, diarrhea or allergies. Ask your doctor or health care professional for advice, some ingredients may increase possible side effects. This medicine can reduce the response of your body to heat or cold. Dress warm in cold weather and stay hydrated in hot weather. If possible, avoid extreme temperatures like saunas, hot tubs, very hot or cold showers, or activities that can cause dehydration such as vigorous exercise. What side effects may I notice from receiving this medicine? Side effects that you should report to your  doctor or health care professional as soon as possible: -allergic reactions like skin rash, itching or hives, swelling of the face, lips, or tongue -difficulty swallowing -fast or irregular heartbeat -fever or chills, sore throat -fever with rash, swollen lymph nodes, or swelling of the face -increased hunger or thirst -increased urination -problems with balance, talking, walking -seizures -stiff muscles -suicidal thoughts or other mood changes -uncontrollable head, mouth, neck, arm, or leg movements -unusually weak or tired Side effects that usually do not require medical attention (report to your doctor or health care professional if they continue or are bothersome): -change in sex drive or performance -constipation -drowsy or dizzy -dry mouth -stomach upset -weight gain This list may not describe all possible side effects. Call your doctor for medical advice about side effects. You may report side effects to FDA at 1-800-FDA-1088. Where should I keep my medicine? Keep out of the reach of children. Store at room temperature between 15 and 30 degrees C (59 and 86 degrees F). Throw away any unused medicine after the expiration date. NOTE: This sheet is a summary. It may not cover all possible information. If you have questions about this medicine, talk to your doctor, pharmacist, or health care provider.  2018 Elsevier/Gold Standard (2015-06-05 13:07:35)

## 2018-10-19 NOTE — Progress Notes (Signed)
[name]: Marvin [name] DOB: 06/11/1951  REASON FOR VISIT: follow up HISTORY FROM: [name]  HISTORY OF PRESENT ILLNESS: Today 10/19/18:  Marvin [name] is a 67 year old male with a history of memory disturbance.  Marvin [name] returns today for follow-up.  Marvin [name] is here with his wife.  His wife reports that Marvin [name] has been having hallucinations.  She reports that Marvin [name] sees people in Marvin bathroom and does not want to go in. Marvin [name] states that there are children in Marvin house and Marvin [name] states that they need to move. She states that Marvin [name] tends to let stray cats in during Marvin night.  Marvin [name] will also wander outside of Marvin house.  She reports that Marvin [name] continues to talk about seeing his parents who have been deceased.  Marvin [name] requires assistance with all ADLs.  She has been able to clear all his meals.  Marvin [name] is able to feed himself.  She states that Marvin [name] often will be excluded appropriately.  For example Marvin [name] will make himself cereal with milk but put a salad on top of Marvin cereal.  She did start Namenda but it made his behavior worse so they discontinued his medication.  Marvin [name] remains on Aricept.  Marvin [name] returns today for evaluation.  HISTORY 04/27/18:  Marvin [name] is a 67 year old male with a history of memory disturbance. Marvin [name] returns today for follow-up.  Marvin [name] is here with his wife.  She feels that his memory has gotten worse.  [name] reports that Marvin [name] is able to complete all ADLs independently.  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  His wife manages all Marvin finances.  His wife states that Marvin [name] used to take pride in doing yard work but Marvin [name] is no longer interested in this.  Denies any trouble sleeping however she reports that she works at night so she is not there with him.  She reports that her mother lives in Marvin home and is there during Marvin night.  She states that she tries to take Marvin [name] out during Marvin day but most Marvin time Marvin [name] wants to come back home.  She denies any significant changes in his behavior or mood.  Marvin [name] continues to confuse what Marvin [name] sees on  TV with reality.  She states that Marvin [name] still enjoys going outside and hitting golf balls.  Marvin [name] remains on Aricept 10 mg daily.  [name] returns today for an evaluation.   REVIEW OF SYSTEMS: Out of a complete 14 system review of symptoms, Marvin [name] complains only of Marvin following symptoms, and all other reviewed systems are negative.  See HPI  ALLERGIES: Allergies  Allergen Reactions  . Aspirin   . Naprosyn [Naproxen]   . Penicillins     HOME MEDICATIONS: Outpatient Medications Prior to Visit  Medication Sig Dispense Refill  . allopurinol (ZYLOPRIM) 300 MG tablet Take 1 tablet (300 mg total) by mouth daily. 30 tablet 6  . amLODipine (NORVASC) 10 MG tablet TAKE 1 TABLET BY MOUTH ONCE DAILY 90 tablet 1  . atorvastatin (LIPITOR) 40 MG tablet Take 1 tablet (40 mg total) by mouth daily. 30 tablet 5  . benazepril (LOTENSIN) 40 MG tablet Take 1 tablet (40 mg total) by mouth daily. 90 tablet 1  . cloNIDine (CATAPRES) 0.2 MG tablet TAKE 1 TABLET BY MOUTH THREE TIMES DAILY 270 tablet 1  . donepezil (ARICEPT) 10 MG tablet Take 1 tablet (10 mg total) by mouth at bedtime. 90 tablet 3  . furosemide (LASIX) 20 MG tablet Take 1 tablet (20 mg  total) by mouth daily. 30 tablet 3  . memantine (NAMENDA TITRATION PAK) tablet pack 5 mg/day for =1 week; 5 mg twice daily for =1 week; 15 mg/day given in 5 mg and 10 mg separated doses for =1 week; then 10 mg twice daily 49 tablet 0  . Vitamin D, Ergocalciferol, (DRISDOL) 50000 units CAPS capsule TAKE 1 CAPSULE BY MOUTH ONCE A WEEK (EVERY  7  DAYS) 12 capsule 0   No facility-administered medications prior to visit.     PAST MEDICAL HISTORY: Past Medical History:  Diagnosis Date  . Degenerative joint disease   . Dementia   . Gout   . Hemorrhoids    Dr. Romona Curls  . Hyperlipidemia   . Hypertension   . Impaired glucose tolerance   . Premature atrial contractions     PAST SURGICAL HISTORY: Past Surgical History:  Procedure Laterality Date  . COLONOSCOPY   07/2004  . COLONOSCOPY N/A 09/22/2014   Procedure: COLONOSCOPY;  Surgeon: Daneil Dolin, MD;  Location: [facility];  Service: Endoscopy;  Laterality: N/A;  9:30 AM        PT REQUEST TIME  . KNEE ARTHROSCOPY     Right    FAMILY HISTORY: Family History  Problem Relation Age of Onset  . Alzheimer's disease Mother   . Diabetes Father   . Heart failure Father        prostate disease   . Alzheimer's disease Sister   . Kidney failure Brother   . Diabetes Brother   . High blood pressure Brother   . Cancer Brother     SOCIAL HISTORY: Social History   Socioeconomic History  . Marital status: Married    Spouse name: Not on file  . Number of children: 1  . Years of education: 40  . Highest education level: Not on file  Occupational History  . Occupation: Systems developer: Probation officer  Social Needs  . Financial resource strain: Not on file  . Food insecurity:    Worry: Not on file    Inability: Not on file  . Transportation needs:    Medical: Not on file    Non-medical: Not on file  Tobacco Use  . Smoking status: Former Smoker    Last attempt to quit: 1992    Years since quitting: 27.8  . Smokeless tobacco: Never Used  Substance and Sexual Activity  . Alcohol use: No    Comment: Quit 1992  . Drug use: No  . Sexual activity: Not on file    Comment: Married  Lifestyle  . Physical activity:    Days per week: Not on file    Minutes per session: Not on file  . Stress: Not on file  Relationships  . Social connections:    Talks on phone: Not on file    Gets together: Not on file    Attends religious service: Not on file    Active member of club or organization: Not on file    Attends meetings of clubs or organizations: Not on file    Relationship status: Not on file  . Intimate partner violence:    Fear of current or ex partner: Not on file    Emotionally abused: Not on file    Physically abused: Not on file    Forced sexual activity: Not on file    Other Topics Concern  . Not on file  Social History Narrative   Lives at home w/ his wife  Right-handed   Caffeine: 1 cup daily   Education- assoc degree      PHYSICAL EXAM  Vitals:   10/19/18 1054  BP: (!) 160/78  Pulse: 69  Weight: 222 lb 9.6 oz (101 kg)  Height: 5\' 10"  (1.778 m)   Body mass index is 31.94 kg/m.  Generalized: Well developed, in no acute distress   Neurological examination  Mentation: Alert oriented to time, place, history taking. Follows all commands speech and language fluent Cranial nerve II-XII: Pupils were equal round reactive to light. Extraocular movements were full, visual field were full on confrontational test. Facial sensation and strength were normal. Uvula tongue midline. Head turning and shoulder shrug  were normal and symmetric. Motor: Marvin motor testing reveals 5 over 5 strength of all 4 extremities. Good symmetric motor tone is noted throughout.  Sensory: Sensory testing is intact to soft touch on all 4 extremities. No evidence of extinction is noted.  Coordination: Cerebellar testing reveals good finger-nose-finger and heel-to-shin bilaterally.  Gait and station: Gait is normal.  Reflexes: Deep tendon reflexes are symmetric and normal bilaterally.   DIAGNOSTIC DATA (LABS, IMAGING, TESTING) - I reviewed [name] records, labs, notes, testing and imaging myself where available.  Lab Results  Component Value Date   WBC 3.7 10/27/2017   HGB 14.0 10/27/2017   HCT 43.4 10/27/2017   MCV 88 10/27/2017   PLT 230 10/27/2017      Component Value Date/Time   NA 139 04/05/2018 0904   NA 143 10/27/2017 1036   K 4.3 04/05/2018 0904   CL 105 04/05/2018 0904   CO2 29 04/05/2018 0904   GLUCOSE 111 (H) 04/05/2018 0904   BUN 16 04/05/2018 0904   BUN 10 10/27/2017 1036   CREATININE 1.03 04/05/2018 0904   CALCIUM 9.1 04/05/2018 0904   PROT 7.0 04/05/2018 0904   PROT 7.4 10/27/2017 1036   ALBUMIN 4.4 10/27/2017 1036   AST 16 04/05/2018 0904    ALT 16 04/05/2018 0904   ALKPHOS 134 (H) 10/27/2017 1036   BILITOT 0.6 04/05/2018 0904   BILITOT 0.5 10/27/2017 1036   GFRNONAA 75 04/05/2018 0904   GFRAA 87 04/05/2018 0904   Lab Results  Component Value Date   CHOL 222 (H) 04/05/2018   HDL 48 04/05/2018   LDLCALC 156 (H) 04/05/2018   TRIG 79 04/05/2018   CHOLHDL 4.6 04/05/2018   Lab Results  Component Value Date   HGBA1C 5.9 (H) 04/05/2018   Lab Results  Component Value Date   VITAMINB12 256 01/13/2017   Lab Results  Component Value Date   TSH 0.75 04/05/2018      ASSESSMENT AND PLAN 67 y.o. year old male  has a past medical history of Degenerative joint disease, Dementia, Gout, Hemorrhoids, Hyperlipidemia, Hypertension, Impaired glucose tolerance, and Premature atrial contractions. here with:  1.  Memory disturbance 2.  Hallucinations  Marvin [name]'s last MMSE was 10 out of 30.  Marvin [name] is having hallucinations.  We will start him on low-dose Seroquel 25 mg at bedtime.  If this is not beneficial they will let us know.  I have reviewed potential side effects with Marvin [name] and his wife.  Also reviewed black box warning with Marvin [name] and his wife.  I provided him with a handout.  Also gave him several resources for dementia patients.  Advised that if his symptoms worsen or Marvin [name] develops new symptoms they should let us know.  Marvin [name] will follow-up in 6 months or sooner if needed.  Ward Givens, MSN, NP-C 10/19/2018, 10:50 AM [facility] [address] [place], [place] [address] [phone]

## 2018-10-20 ENCOUNTER — Telehealth: Payer: Self-pay | Admitting: *Deleted

## 2018-10-20 NOTE — Telephone Encounter (Signed)
Received FMLA papers for patient's wife/caregiver from [place] [employer]. Forms completed, on NP's desk for review, signature.

## 2018-10-21 DIAGNOSIS — Z0289 Encounter for other administrative examinations: Secondary | ICD-10-CM

## 2018-10-26 NOTE — Telephone Encounter (Signed)
[place] [employer] FML[place] papers completed, signed and sent to medical records for processing.

## 2018-10-27 ENCOUNTER — Telehealth: Payer: Self-pay | Admitting: *Deleted

## 2018-10-27 NOTE — Telephone Encounter (Signed)
[place] [place] form faxed to [phone]

## 2018-11-02 ENCOUNTER — Ambulatory Visit: Payer: Medicare HMO | Admitting: Adult Health

## 2018-11-04 ENCOUNTER — Encounter: Payer: Medicare HMO | Admitting: Family Medicine

## 2018-11-04 ENCOUNTER — Ambulatory Visit: Payer: Medicare HMO

## 2018-11-04 ENCOUNTER — Telehealth: Payer: Self-pay

## 2018-11-04 NOTE — Telephone Encounter (Signed)
Per patient's wife, his symptoms of dementia are becoming more than she can handle at home, wanting to place patient in a facility for long term care. [facility] in [place], [place]. Spoke with Gaetano Net administrator of [facility] and was given the information needed for placement into the [facility]. Left a message with Zadiel Leyh (patient's wife) for her to call me back so I can relay to her the information that I received .

## 2018-12-06 ENCOUNTER — Other Ambulatory Visit: Payer: Self-pay | Admitting: Family Medicine

## 2019-01-18 ENCOUNTER — Encounter: Payer: Self-pay | Admitting: *Deleted

## 2019-01-19 ENCOUNTER — Telehealth: Payer: Self-pay | Admitting: Family Medicine

## 2019-01-19 NOTE — Telephone Encounter (Signed)
AGREE  Please call wife and if she has not called 911, let her know that I have advised you to call and report what she has called in stating  Thank you

## 2019-01-19 NOTE — Telephone Encounter (Signed)
Wife is calling concerned with husband, she is scared, he was aggressive yesterday 01-18-19 to her, and has taken all the knifes he could locate and placed them in a line on a table.  She said he has gotten worse, and is reaching out to Dr Moshe Cipro to see if she can help get him out of the house soon.  Call the home number [phone].  I advised Mrs Debella that if she fears for her life, or there is ANY Aggression, to PLEASE call 911. DO NOT HESITATE, this would be for her own safety, and Mr Everett would be taken for evaluation.  I advised her again, her safety is #1.

## 2019-01-19 NOTE — Telephone Encounter (Signed)
See previous message. Pt wife was contacted by office administrator, Rosaria Ferries

## 2019-01-19 NOTE — Telephone Encounter (Signed)
Per Dr Moshe Cipro I called Marvin [name] to see if she had called the Police.  She said she did not call.  I let her know we were calling for a well check.  She said ok.  I called [employer] [phone] and spoke with Otila Kluver.  She will have a officer check on the home.

## 2019-01-24 ENCOUNTER — Telehealth: Payer: Self-pay | Admitting: *Deleted

## 2019-01-24 NOTE — Telephone Encounter (Signed)
pls fill in an FL@ based on clinical info in cghart as able, verify meds with spouse. I will review and sign

## 2019-01-24 NOTE — Telephone Encounter (Signed)
Olin Hauser (wife) called and stated that she is trying to get Marvin [name] into [facility] but he needs an FL2 filled out. She didn't know if he needed to be seen as he is very combative and would not get out of the car last time. She stated she just doesn't have the help she needs at home and she felt placing him was the right decision.

## 2019-01-25 ENCOUNTER — Telehealth: Payer: Self-pay | Admitting: *Deleted

## 2019-01-25 ENCOUNTER — Telehealth: Payer: Self-pay | Admitting: Family Medicine

## 2019-01-25 NOTE — Telephone Encounter (Signed)
fl2 faxed to [facility] in [place] as requested

## 2019-01-25 NOTE — Telephone Encounter (Signed)
Gaetano Net with [facility] called requesting demographics and last office visit notes sent via release HIM Release [id number]

## 2019-01-25 NOTE — Telephone Encounter (Signed)
Complete and ready for dr review/sig

## 2019-01-25 NOTE — Telephone Encounter (Signed)
Form completed, reviewed and signed , pls follow through, thanks, it is in your box

## 2019-03-06 ENCOUNTER — Other Ambulatory Visit: Payer: Self-pay | Admitting: Family Medicine

## 2019-03-08 ENCOUNTER — Telehealth: Payer: Self-pay

## 2019-03-08 NOTE — Telephone Encounter (Signed)
Called to see if patient is at home or if he has been placed in a care facility. No answer. Will try again later.

## 2019-03-10 ENCOUNTER — Telehealth: Payer: Self-pay

## 2019-03-10 MED ORDER — CLONIDINE HCL 0.2 MG PO TABS: 0.2000 mg | ORAL_TABLET | Freq: Three times a day (TID) | ORAL | 0 refills | Status: DC

## 2019-03-10 NOTE — Telephone Encounter (Signed)
Patient still in the home. Clonidine refilled

## 2019-03-29 ENCOUNTER — Ambulatory Visit (INDEPENDENT_AMBULATORY_CARE_PROVIDER_SITE_OTHER): Payer: Medicare HMO | Admitting: Family Medicine

## 2019-03-29 ENCOUNTER — Encounter: Payer: Self-pay | Admitting: Family Medicine

## 2019-03-29 ENCOUNTER — Other Ambulatory Visit: Payer: Self-pay

## 2019-03-29 DIAGNOSIS — M109 Gout, unspecified: Secondary | ICD-10-CM

## 2019-03-29 MED ORDER — COLCHICINE 0.6 MG PO TABS: ORAL_TABLET | ORAL | 1 refills | Status: DC

## 2019-03-29 NOTE — Patient Instructions (Addendum)
    Thank you for completing your telemedicine visit today. I appreciate the opportunity to provide you with the care for your health and wellness. Today we discussed: Gout  I have sent in the colchicine to the pharmacy.  Please continue to take the allopurinol even after the gout flare has decreased and gone away.  Please call the office if you do not seem to feel like this is getting any better or worsens.   [place] YOUR HANDS WELL AND FREQUENTLY. AVOID TOUCHING YOUR FACE, UNLESS YOUR HANDS ARE FRESHLY WASHED.  GET FRESH AIR DAILY. STAY HYDRATED WITH WATER.   It was a pleasure to see you and I look forward to continuing to work together on your health and well-being.Please do not hesitate to call the office if you need care or have questions about your care.  Have a wonderful day and week. With Gratitude, Cherly Beach, DNP, AGNP-BC

## 2019-03-29 NOTE — Progress Notes (Signed)
Virtual Visit via Telephone Note  I connected with Marvin [name] on 03/29/19 at  1:20 PM EDT by telephone and verified that I am speaking with the correct person using two identifiers.   I discussed the limitations, risks, security and privacy concerns of performing an evaluation and management service by telephone and the availability of in person appointments. I also discussed with the patient that there may be a patient responsible charge related to this service. The patient expressed understanding and agreed to proceed.  Location of Patient: Home Location of Provider: Telehealth Consent was obtain for visit to be over via telehealth.  History of Present Illness: [name] is a 68 year old male patient of Dr. Griffin Dakin.  It was well-known to the clinic.  This today for a telemedicine visit secondary to having some acute gout flare in the left wrist.  Previously prescribed allopurinol but wife did not realize that he needed to take it to make sure that he did not have flareups again.  Patient has dementia and requires his wife to help with maintenance of his medications.  Left wrist started having pain and swelling 2 days ago.  No known injury.  She reports that he holds it she thinks that is probably a level 7-9 out of 10 for pain. Denies any fevers, chills, chest pain, shortness of breath, chest tightness, wheezing, any other signs or symptoms of infection today.  Past Medical, Surgical, Social History, Allergies, and Medications have been Reviewed.   Review of Systems  Constitutional: Negative for chills and fever.  HENT: Negative.   Eyes: Negative.   Respiratory: Negative for cough, shortness of breath and wheezing.   Cardiovascular: Negative for chest pain, palpitations and leg swelling.  Gastrointestinal: Negative.   Genitourinary: Negative.   Musculoskeletal: Positive for joint pain.  Skin: Negative.   Neurological: Negative.   Endo/Heme/Allergies: Negative.    Psychiatric/Behavioral: Negative.    Observations/Objective: Physical Exam Psychiatric:        Cognition and Memory: Cognition is impaired. Memory is impaired.     Comments: Wife did most of the talking during the conversation. Patient was able to say hello but otherwise did not communicate over the phone.    Assessment and Plan: 1. Acute gout of left wrist, unspecified cause Reports that she had a refill of allopurinol and then call back into the pharmacy.  Prescribed colchicine today to help with acute flare.  Concerned that it might not work for him considering that it is been greater than 2 days.  Educated her of this.  He is allergic to aspirin and naproxen.  Would not suggest doing Indocin at this time.  Could possibly tolerate low-dose ibuprofen if needed for pain and or Tylenol.  Advised her to return or call to the clinic if he has any issues or if his symptoms do not improve.  Reviewed side effects, risks and benefits of medication.   Patient (wife) acknowledged agreement and understanding of the plan.    - colchicine 0.6 MG tablet; Please take 1.2 mg, (2 tablets) now, then 0.6 mg, (1 tablet) in 1 hour today. Then take 0.6 mg once or twice daily for the next 3 days or until diarrhea occurs.  Dispense: 10 tablet; Refill: 1   Follow Up Instructions: AVS printed and mailed   I discussed the assessment and treatment plan with the patient. The patient was provided an opportunity to ask questions and all were answered. The patient agreed with the plan and demonstrated an understanding of  the instructions.   The patient was advised to call back or seek an in-person evaluation if the symptoms worsen or if the condition fails to improve as anticipated.  I provided 10 minutes of non-face-to-face time during this encounter.   Perlie Mayo, NP

## 2019-04-19 ENCOUNTER — Other Ambulatory Visit: Payer: Self-pay | Admitting: Adult Health

## 2019-04-25 ENCOUNTER — Other Ambulatory Visit: Payer: Self-pay | Admitting: Family Medicine

## 2019-04-25 ENCOUNTER — Telehealth: Payer: Self-pay | Admitting: Family Medicine

## 2019-04-25 MED ORDER — PREDNISONE 5 MG PO TABS: 5.0000 mg | ORAL_TABLET | Freq: Two times a day (BID) | ORAL | 0 refills | Status: AC

## 2019-04-25 NOTE — Telephone Encounter (Signed)
Pts wife Olin Hauser is calling and states that the PT wrist is swollen again,  There is no pain, so she is unsure that it is gout related, can you please call her

## 2019-04-25 NOTE — Telephone Encounter (Signed)
pls advise thatr I am prescribing pred for 5 days, one twice daily

## 2019-04-25 NOTE — Progress Notes (Signed)
pred 5  

## 2019-04-25 NOTE — Telephone Encounter (Signed)
Left hand, no pain this time. Wife moved hand around and pt didn't act like it hurt or anything. Happened 2 weeks ago and Marvin [name] wrote a script for colcrys and it went away. This time hand has been swollen a couple of days. Act like it hurt maybe yesterday but doesn't seem to bother him today.

## 2019-04-26 NOTE — Telephone Encounter (Signed)
Spoke with wife and advised of treatment plan with verbal understanding

## 2019-04-26 NOTE — Telephone Encounter (Signed)
Called to speak to wife (DPR) no answer, unable to leave message. Will try again later

## 2019-05-16 ENCOUNTER — Emergency Department (HOSPITAL_COMMUNITY)
Admission: EM | Admit: 2019-05-16 | Discharge: 2019-05-19 | Disposition: A | Discharge status: Other Acute Inpatient Hospital | Payer: Medicare HMO | Attending: Emergency Medicine | Admitting: Emergency Medicine

## 2019-05-16 ENCOUNTER — Other Ambulatory Visit: Payer: Self-pay | Admitting: Adult Health

## 2019-05-16 ENCOUNTER — Other Ambulatory Visit: Payer: Self-pay

## 2019-05-16 ENCOUNTER — Encounter (HOSPITAL_COMMUNITY): Payer: Self-pay | Admitting: Emergency Medicine

## 2019-05-16 DIAGNOSIS — R748 Abnormal levels of other serum enzymes: Secondary | ICD-10-CM | POA: Diagnosis not present

## 2019-05-16 DIAGNOSIS — Z79899 Other long term (current) drug therapy: Secondary | ICD-10-CM | POA: Diagnosis not present

## 2019-05-16 DIAGNOSIS — I10 Essential (primary) hypertension: Secondary | ICD-10-CM | POA: Diagnosis not present

## 2019-05-16 DIAGNOSIS — Z1159 Encounter for screening for other viral diseases: Secondary | ICD-10-CM | POA: Insufficient documentation

## 2019-05-16 DIAGNOSIS — Z03818 Encounter for observation for suspected exposure to other biological agents ruled out: Secondary | ICD-10-CM | POA: Diagnosis not present

## 2019-05-16 DIAGNOSIS — Z87891 Personal history of nicotine dependence: Secondary | ICD-10-CM | POA: Diagnosis not present

## 2019-05-16 DIAGNOSIS — R7989 Other specified abnormal findings of blood chemistry: Secondary | ICD-10-CM | POA: Insufficient documentation

## 2019-05-16 DIAGNOSIS — Z046 Encounter for general psychiatric examination, requested by authority: Secondary | ICD-10-CM | POA: Diagnosis not present

## 2019-05-16 DIAGNOSIS — F0390 Unspecified dementia without behavioral disturbance: Secondary | ICD-10-CM | POA: Diagnosis not present

## 2019-05-16 DIAGNOSIS — R4182 Altered mental status, unspecified: Secondary | ICD-10-CM | POA: Diagnosis not present

## 2019-05-16 DIAGNOSIS — Z0489 Encounter for examination and observation for other specified reasons: Secondary | ICD-10-CM | POA: Diagnosis present

## 2019-05-16 DIAGNOSIS — R69 Illness, unspecified: Secondary | ICD-10-CM | POA: Diagnosis not present

## 2019-05-16 LAB — CBC WITH DIFFERENTIAL/PLATELET
Abs Immature Granulocytes: 0.01 10*3/uL (ref 0.00–0.07)
Basophils Absolute: 0 10*3/uL (ref 0.0–0.1)
Basophils Relative: 1 %
Eosinophils Absolute: 0.1 10*3/uL (ref 0.0–0.5)
Eosinophils Relative: 3 %
HCT: 42.2 % (ref 39.0–52.0)
Hemoglobin: 13.3 g/dL (ref 13.0–17.0)
Immature Granulocytes: 0 %
Lymphocytes Relative: 40 %
Lymphs Abs: 1.7 10*3/uL (ref 0.7–4.0)
MCH: 28.2 pg (ref 26.0–34.0)
MCHC: 31.5 g/dL (ref 30.0–36.0)
MCV: 89.4 fL (ref 80.0–100.0)
Monocytes Absolute: 0.5 10*3/uL (ref 0.1–1.0)
Monocytes Relative: 11 %
Neutro Abs: 2 10*3/uL (ref 1.7–7.7)
Neutrophils Relative %: 45 %
Platelets: 180 10*3/uL (ref 150–400)
RBC: 4.72 MIL/uL (ref 4.22–5.81)
RDW: 14.9 % (ref 11.5–15.5)
WBC: 4.4 10*3/uL (ref 4.0–10.5)
nRBC: 0 % (ref 0.0–0.2)

## 2019-05-16 LAB — URINALYSIS, ROUTINE W REFLEX MICROSCOPIC
Bilirubin Urine: NEGATIVE
Glucose, UA: NEGATIVE mg/dL
Hgb urine dipstick: NEGATIVE
Ketones, ur: NEGATIVE mg/dL
Leukocytes,Ua: NEGATIVE
Nitrite: NEGATIVE
Protein, ur: NEGATIVE mg/dL
Specific Gravity, Urine: 1.024 (ref 1.005–1.030)
pH: 5 (ref 5.0–8.0)

## 2019-05-16 LAB — BASIC METABOLIC PANEL
Anion gap: 9 (ref 5–15)
BUN: 14 mg/dL (ref 8–23)
CO2: 23 mmol/L (ref 22–32)
Calcium: 8.8 mg/dL — ABNORMAL LOW (ref 8.9–10.3)
Chloride: 104 mmol/L (ref 98–111)
Creatinine, Ser: 1.34 mg/dL — ABNORMAL HIGH (ref 0.61–1.24)
GFR calc Af Amer: >60 mL/min (ref >60)
GFR calc non Af Amer: 54 mL/min — ABNORMAL LOW (ref >60)
Glucose, Bld: 128 mg/dL — ABNORMAL HIGH (ref 70–99)
Potassium: 4.1 mmol/L (ref 3.5–5.1)
Sodium: 136 mmol/L (ref 135–145)

## 2019-05-16 MED ORDER — DONEPEZIL HCL 5 MG PO TABS
10.0000 mg | ORAL_TABLET | Freq: Every day | ORAL | Status: DC
  Administered 2019-05-17 (×2): 10 mg via ORAL
  Filled 2019-05-16 (×2): qty 2
  Filled 2019-05-16: qty 1
  Filled 2019-05-16: qty 2
  Filled 2019-05-16: qty 1

## 2019-05-16 MED ORDER — ALLOPURINOL 300 MG PO TABS
300.0000 mg | ORAL_TABLET | Freq: Every day | ORAL | Status: DC
  Administered 2019-05-17 (×2): 300 mg via ORAL
  Filled 2019-05-16 (×5): qty 1

## 2019-05-16 MED ORDER — QUETIAPINE FUMARATE 25 MG PO TABS: 25.0000 mg | ORAL_TABLET | Freq: Every day | ORAL | Status: DC

## 2019-05-16 MED ORDER — LORAZEPAM 1 MG PO TABS
1.0000 mg | ORAL_TABLET | Freq: Once | ORAL | Status: AC
  Administered 2019-05-16: 1 mg via ORAL
  Filled 2019-05-16: qty 1

## 2019-05-16 MED ORDER — CLONIDINE HCL 0.2 MG PO TABS
0.2000 mg | ORAL_TABLET | Freq: Three times a day (TID) | ORAL | Status: DC
  Administered 2019-05-17 – 2019-05-19 (×8): 0.2 mg via ORAL
  Filled 2019-05-16 (×8): qty 1

## 2019-05-16 NOTE — Discharge Instructions (Addendum)
The patient's kidney function was slightly elevated today.  He will need to make an appointment to follow-up with his primary doctor to have his levels rechecked.

## 2019-05-16 NOTE — ED Notes (Signed)
Couture, PA at bedside

## 2019-05-16 NOTE — ED Triage Notes (Signed)
Patient's wife states patient has dementia and has became harder to take care of. Wife states patient has become more aggressive to her and stays up at night walking the house. Wife scared to leave patient alone at home. Wife states patient is a danger to self due to short term memory loss. Denies SI or HI. Wife is only caregiver and is starting to struggle trying to handle patient alone.

## 2019-05-16 NOTE — ED Notes (Signed)
Left message on voicemail for social work consult.

## 2019-05-16 NOTE — ED Provider Notes (Signed)
[facility] EMERGENCY DEPARTMENT Provider Note   CSN: [id number] Arrival date & time: 05/16/19  1907    History   Chief Complaint Chief Complaint  Patient presents with  . Medical Clearance    HPI Marvin [name] is a 68 y.o. male.     HPI   Patient is a 68 year old male with a history of degenerative joint disease, dimension, gout, hemorrhoids, hyperlipidemia, hypertension, who presents the emergency department today with his wife for evaluation of medical clearance.  Level 5 caveat as patient is demented.  Patient's wife provides most of the history.  She states he was diagnosed with dementia several years ago.  She had been caring for him by herself in their home but over the past several months and even in the last several weeks he has become more difficult to care for.  She states that she works nights and since returning back to work she has become concerned for his safety as he frequently stays awake all night and wanders.  She states he has wandered out of the house and has crossed the road behind him which is a highway.  She states he did not have children and she does not know anyone that can help her with caring for him so she brought him to the ED.  He has had no fevers or complaints of chest pain, shortness of breath cough abdominal pain nausea vomiting diarrhea or urinary symptoms.  Past Medical History:  Diagnosis Date  . Degenerative joint disease   . Dementia ([place])   . Gout   . Hemorrhoids    Dr. Romona Curls  . Hyperlipidemia   . Hypertension   . Impaired glucose tolerance   . Premature atrial contractions     Patient Active Problem List   Diagnosis Date Noted  . Prediabetes 04/13/2018  . Hyperuricemia 04/13/2018  . Memory loss 01/12/2017  . Metabolic syndrome X 78/29/5621  . Obesity (BMI 30.0-34.9) 06/26/2013  . Hypertension   . Hyperlipidemia   . Degenerative joint disease   . Premature atrial contractions   . IGT (impaired glucose tolerance)  02/07/2010    Past Surgical History:  Procedure Laterality Date  . COLONOSCOPY  07/2004  . COLONOSCOPY N/A 09/22/2014   Procedure: COLONOSCOPY;  Surgeon: Daneil Dolin, MD;  Location: [facility] ENDO SUITE;  Service: Endoscopy;  Laterality: N/A;  9:30 AM        PT REQUEST TIME  . KNEE ARTHROSCOPY     Right        Home Medications    Prior to Admission medications   Medication Sig Start Date End Date Taking? Authorizing Provider  allopurinol (ZYLOPRIM) 300 MG tablet Take 300 mg by mouth at bedtime.   Yes [provider]  cloNIDine (CATAPRES) 0.2 MG tablet TAKE 1 TABLET BY MOUTH THREE TIMES DAILY Patient taking differently: Take 0.2-0.4 mg by mouth See admin instructions. 0.2mg  in the morning and 0.4mg  at bedtime 03/18/19  Yes Fayrene Helper, MD  donepezil (ARICEPT) 10 MG tablet Take 1 tablet (10 mg total) by mouth at bedtime. 04/27/18  Yes Ward Givens, NP  QUEtiapine (SEROQUEL) 25 MG tablet TAKE 1 TABLET BY MOUTH AT BEDTIME Patient taking differently: Take 25 mg by mouth at bedtime.  05/16/19  Yes Ward Givens, NP  colchicine 0.6 MG tablet Please take 1.2 mg, (2 tablets) now, then 0.6 mg, (1 tablet) in 1 hour today. Then take 0.6 mg once or twice daily for the next 3 days or until diarrhea  occurs. Patient not taking: Reported on 05/16/2019 03/29/19   Perlie Mayo, NP    Family History Family History  Problem Relation Age of Onset  . Alzheimer's disease Mother   . Diabetes Father   . Heart failure Father        prostate disease   . Alzheimer's disease Sister   . Kidney failure Brother   . Diabetes Brother   . High blood pressure Brother   . Cancer Brother     Social History Social History   Tobacco Use  . Smoking status: Former Smoker    Last attempt to quit: 1992    Years since quitting: 28.4  . Smokeless tobacco: Never Used  Substance Use Topics  . Alcohol use: No    Comment: Quit 1992  . Drug use: No     Allergies   Aspirin; Namenda [memantine hcl];  Naprosyn [naproxen]; and Penicillins   Review of Systems Review of Systems  Unable to perform ROS: Dementia     Physical Exam Updated Vital Signs BP (!) 183/90 (BP Location: Right Arm)   Pulse (!) 59   Temp 98.7 F (37.1 C) (Oral)   Resp 18   Ht 5\' 10"  (1.778 m)   Wt 104.3 kg   SpO2 98%   BMI 33.00 kg/m   Physical Exam Vitals signs and nursing note reviewed.  Constitutional:      General: He is not in acute distress.    Appearance: He is well-developed. He is not ill-appearing or toxic-appearing.  HENT:     Head: Normocephalic and atraumatic.     Mouth/Throat:     Mouth: Mucous membranes are moist.  Eyes:     Extraocular Movements: Extraocular movements intact.     Conjunctiva/sclera: Conjunctivae normal.     Pupils: Pupils are equal, round, and reactive to light.  Neck:     Musculoskeletal: Neck supple.  Cardiovascular:     Rate and Rhythm: Normal rate and regular rhythm.     Heart sounds: Normal heart sounds. No murmur.  Pulmonary:     Effort: Pulmonary effort is normal. No respiratory distress.     Breath sounds: Normal breath sounds. No wheezing, rhonchi or rales.  Abdominal:     Palpations: Abdomen is soft.     Tenderness: There is no abdominal tenderness. There is no guarding or rebound.  Skin:    General: Skin is warm and dry.  Neurological:     Mental Status: He is alert.     Comments: Alert, pleasantly demented. Able to state name but otherwise not oriented. 5/5 strength to bue and ble. Ambulatory with steady gait.      ED Treatments / Results  Labs (all labs ordered are listed, but only abnormal results are displayed) Labs Reviewed  BASIC METABOLIC PANEL - Abnormal; Notable for the following components:      Result Value   Glucose, Bld 128 (*)    Creatinine, Ser 1.34 (*)    Calcium 8.8 (*)    GFR calc non Af Amer 54 (*)    All other components within normal limits  CBC WITH DIFFERENTIAL/PLATELET  URINALYSIS, ROUTINE W REFLEX MICROSCOPIC     EKG None  Radiology No results found.  Procedures Procedures (including critical care time)  Medications Ordered in ED Medications  LORazepam (ATIVAN) tablet 1 mg (1 mg Oral Given 05/16/19 2342)     Initial Impression / Assessment and Plan / ED Course  I have reviewed the triage vital signs and  the nursing notes.  Pertinent labs & imaging results that were available during my care of the patient were reviewed by me and considered in my medical decision making (see chart for details).     Final Clinical Impressions(s) / ED Diagnoses   Final diagnoses:  Dementia without behavioral disturbance, unspecified dementia type (HCC)  Elevated creatine kinase   68 year old male with a history of dementia presenting emergency department with his wife for medical clearance evaluation.  Patient's wife has been caring for him at home however recently has been unable to care for him and provide a safe environment.  She is concerned because he wanders throughout the night and she is unable to monitor him properly so she brought him to the ED.  She denies any recent medical complaints.  Patient is pleasantly demented on exam.  He is moving all extremities with normal sensation and strength.  He is in no acute distress.  Cardiac and pulmonary exams within normal limits.  Abdomen is soft and nontender.  CBC is without leukocytosis or anemia. BMP with mildly elevated Cr, normal BUN. Will need to follow up.  UA negative  Case mgmt and social work consulted. Pt care transitioned to oncoming provider at shift change pending placement.   ED Discharge Orders    None       Bishop Dublin 05/16/19 2348    Maudie Flakes, MD 05/21/19 [phone]

## 2019-05-17 MED ORDER — QUETIAPINE FUMARATE 25 MG PO TABS
25.0000 mg | ORAL_TABLET | Freq: Every day | ORAL | Status: DC
  Administered 2019-05-17: 25 mg via ORAL
  Filled 2019-05-17 (×2): qty 1

## 2019-05-17 NOTE — TOC Progression Note (Signed)
Transition of Care [facility]) - Progression Note    Patient Details  Name: Marvin [name] MRN: [id number] Date of Birth: 1951/12/01  [facility] CM/SW Contact  Roda Shutters Margretta Sidle, RN Phone Number: 05/17/2019, 3:56 PM  Clinical Narrative:   Follow up call from wife. Wife applied for medicaid today. Was unable to speak with DSS worker to discuss eligibility d/t COVID restrictions. Application submitted and Demetrios Isaacs ([place]) is the case worker assigned. TOC team will f/u with Santiago Glad to determine eligibility.

## 2019-05-17 NOTE — ED Notes (Signed)
Pt's wife Mehmet Scally can be reached at:  Home: [phone]

## 2019-05-17 NOTE — ED Notes (Signed)
Pt returned to room, sitter remains with pt,

## 2019-05-17 NOTE — ED Notes (Signed)
Received report on pt, pt walking in room, confused, attempts made to redirect pt without success, pt cooperative, pleasant at present, sitter remains at bedside,

## 2019-05-17 NOTE — ED Notes (Signed)
Pt ate most of his lunch tray, is sitting in chair with sitter at doorway,

## 2019-05-17 NOTE — ED Notes (Signed)
Pt sitting in chair, cooperative, sitter remains at bedside,

## 2019-05-17 NOTE — ED Notes (Signed)
Pt continues to pace in room, is redirectable at present, sitter remains at bedside,

## 2019-05-17 NOTE — ED Notes (Signed)
Pt given dinner tray,  

## 2019-05-17 NOTE — ED Notes (Signed)
Pt given lunch tray,

## 2019-05-17 NOTE — ED Notes (Signed)
Pt wandering in the department, is able to be redirected, security at bedside with sitter and advised that they would go with pt to the courtyard. Pt walked to the courtyard, was cooperative,

## 2019-05-17 NOTE — Care Management (Signed)
CM spoke with wife Olin Hauser) pt has had dementia for 2 years has become worse over the past month. Pt wonders at night and is unsafe in the home. Pt has one son who is in prison, wife has no children, no family to help. She has been out of work d/t COVID and was supposed to go back to work Sunday but has been unable to do so d/t his worsening dementia. Wife was told by a healthcare professional to bring him to the ED for placement. Wife has done no preporation for long term planning. Does not have medicaid or long term insurance. Would have to pay OOP and no funds are available. CM discussed reality of finding placement for pt from the ED and that without a source of funding we can not place him. Wife is going to apply for medicaid this AM and will call back with update after she speaks with them. Back up option of hiring someone to come into the home while she works at night may be her only back up options. Wife does not think she can afford this either.

## 2019-05-17 NOTE — ED Notes (Signed)
Rn spoke with pt's wife,

## 2019-05-18 ENCOUNTER — Emergency Department (HOSPITAL_COMMUNITY): Payer: Medicare HMO

## 2019-05-18 DIAGNOSIS — I10 Essential (primary) hypertension: Secondary | ICD-10-CM | POA: Diagnosis not present

## 2019-05-18 DIAGNOSIS — R4182 Altered mental status, unspecified: Secondary | ICD-10-CM | POA: Diagnosis not present

## 2019-05-18 LAB — RAPID URINE DRUG SCREEN, HOSP PERFORMED
Amphetamines: NOT DETECTED
Barbiturates: NOT DETECTED
Benzodiazepines: POSITIVE — AB
Cocaine: NOT DETECTED
Opiates: NOT DETECTED
Tetrahydrocannabinol: NOT DETECTED

## 2019-05-18 LAB — SARS CORONAVIRUS 2 BY RT PCR (HOSPITAL ORDER, PERFORMED IN ~~LOC~~ HOSPITAL LAB): SARS Coronavirus 2: NEGATIVE

## 2019-05-18 MED ORDER — HALOPERIDOL LACTATE 5 MG/ML IJ SOLN: 5.0000 mg | Freq: Once | INTRAMUSCULAR | Status: DC

## 2019-05-18 MED ORDER — STERILE WATER FOR INJECTION IJ SOLN
INTRAMUSCULAR | Status: AC
  Administered 2019-05-18: 1.2 mL
  Filled 2019-05-18: qty 10

## 2019-05-18 MED ORDER — DIPHENHYDRAMINE HCL 50 MG/ML IJ SOLN
25.0000 mg | Freq: Once | INTRAMUSCULAR | Status: AC | PRN
  Administered 2019-05-18: 25 mg via INTRAMUSCULAR
  Filled 2019-05-18: qty 1

## 2019-05-18 MED ORDER — ZIPRASIDONE MESYLATE 20 MG IM SOLR
INTRAMUSCULAR | Status: AC
  Administered 2019-05-18: 10 mg via INTRAMUSCULAR
  Filled 2019-05-18: qty 20

## 2019-05-18 MED ORDER — HALOPERIDOL LACTATE 5 MG/ML IJ SOLN
5.0000 mg | Freq: Once | INTRAMUSCULAR | Status: AC | PRN
  Administered 2019-05-19: 5 mg via INTRAMUSCULAR
  Filled 2019-05-18: qty 1

## 2019-05-18 MED ORDER — ZIPRASIDONE MESYLATE 20 MG IM SOLR
20.0000 mg | Freq: Once | INTRAMUSCULAR | Status: AC
  Administered 2019-05-18: 20 mg via INTRAMUSCULAR
  Filled 2019-05-18: qty 20

## 2019-05-18 MED ORDER — LORAZEPAM 2 MG/ML IJ SOLN
1.0000 mg | Freq: Four times a day (QID) | INTRAMUSCULAR | Status: DC | PRN
  Administered 2019-05-18: 1 mg via INTRAMUSCULAR
  Filled 2019-05-18: qty 1

## 2019-05-18 MED ORDER — LORAZEPAM 2 MG/ML IJ SOLN
1.0000 mg | Freq: Once | INTRAMUSCULAR | Status: AC
  Administered 2019-05-18: 1 mg via INTRAMUSCULAR
  Filled 2019-05-18: qty 1

## 2019-05-18 MED ORDER — ZIPRASIDONE MESYLATE 20 MG IM SOLR
10.0000 mg | Freq: Once | INTRAMUSCULAR | Status: AC
  Administered 2019-05-18: 03:00:00 10 mg via INTRAMUSCULAR

## 2019-05-18 MED ORDER — STERILE WATER FOR INJECTION IJ SOLN
INTRAMUSCULAR | Status: AC
  Administered 2019-05-18: 10 mL
  Filled 2019-05-18: qty 10

## 2019-05-18 MED ORDER — DROPERIDOL 2.5 MG/ML IJ SOLN: 2.5000 mg | Freq: Once | INTRAMUSCULAR | Status: DC

## 2019-05-18 MED ORDER — DIPHENHYDRAMINE HCL 50 MG/ML IJ SOLN
25.0000 mg | Freq: Once | INTRAMUSCULAR | Status: AC
  Administered 2019-05-18: 25 mg via INTRAMUSCULAR
  Filled 2019-05-18: qty 1

## 2019-05-18 NOTE — ED Notes (Signed)
Pt is asleep

## 2019-05-18 NOTE — ED Notes (Signed)
Pt walking out into the hallways again stating, "I want to go home". Security with pt and attempting to redirect pt back to his room. Pt refusing, pushing security to get past them and becoming aggressive. [employer] came to bedside and was able to redirect pt back to his room. MD notified and going to order medication to help pt calm down.

## 2019-05-18 NOTE — Consult Note (Signed)
Telepsych Consultation   Reason for Consult:  behavioral disturbance related to his dementia.  Referring Physician:  EDP  Location of Patient: [facility] Location of Provider: [facility]  Patient Identification: Marvin [name] MRN:  [id number] Principal Diagnosis: <principal problem not specified> Diagnosis:  Active Problems:   * No active hospital problems. *   Total Time spent with patient: 20 minutes  Subjective:   Marvin [name] is a 68 y.o. male patient admitted with behavioral disturbance related to his dementia.   HPI: This is a 68 year old African American male requiring psychiatric consultation after his wife reported behavioral disturbance related to his dementia. Patient was a poor historian. He was dioriented to time and place as when asked what day it was he replied, Saturday." When asked if he knew where he was he replied, " over there and pointed to the wall." Patient did not answer any other questions so I spoke with is wife to obtain further information. According to his wife, Marvin [name], patient was diagnosed with dementia in 2018. She reports his mental capacity and memory has depleted since. Reports that patient has not been sleeping and is wandering throughout the night stating, " I am going home." Reports that patient is becoming  More violent and aggressive towards her and she is now afraid that he may harm her.Reports that she is unsure if he would hurt himself although prior to him being diagnosed with dementia, he has stated on several occasions that he would kill himself if he ever had the condition. Reports there is a strong family history of dementia. Reports she work third shift and she has been afraid to leave patient alone and that she has no support system. Reports that she recently spoke with patients case worker who advised her to apply for Medicaid so patient may qualify for residence at a long term treatment facility. Reports that patient  has no history of mental illness nor subsequent treatment. States that patient has never attempted to harm himself in the past, been homicidal, or psychotic. Reports that patient has only been a social drinker in the past and denies other substance abuse or use.   Per review of chart, patient has exhibited some agitated and combative behaviors at the ED requiring PRN medication for agitation. During this evaluation, he is very fidgety and restless. As per guardian, this is patient presentation at home. To note, patient has been placed on  IVC due to his behaviors in the ED.   Past Psychiatric History: None   Risk to Self: Suicidal Ideation: No Suicidal Intent: No Is patient at risk for suicide?: No Suicidal Plan?: No Access to Means: No What has been your use of drugs/alcohol within the last 12 months?: none How many times?: 0 Other Self Harm Risks: wanders Triggers for Past Attempts: None known Intentional Self Injurious Behavior: None Risk to Others: Homicidal Ideation: No Thoughts of Harm to Others: No Current Homicidal Intent: No Current Homicidal Plan: No Access to Homicidal Means: No Identified Victim: none History of harm to others?: No Assessment of Violence: None Noted Violent Behavior Description: none Does patient have access to weapons?: No Criminal Charges Pending?: No Does patient have a court date: No Prior Inpatient Therapy: Prior Inpatient Therapy: No Prior Outpatient Therapy: Prior Outpatient Therapy: No Does patient have an ACCT team?: No Does patient have Intensive In-House Services?  : No Does patient have [facility] services? : No Does patient have P4CC services?: No  Past  Medical History:  Past Medical History:  Diagnosis Date  . Degenerative joint disease   . Dementia ([place])   . Gout   . Hemorrhoids    Dr. Romona Curls  . Hyperlipidemia   . Hypertension   . Impaired glucose tolerance   . Premature atrial contractions     Past Surgical History:   Procedure Laterality Date  . COLONOSCOPY  07/2004  . COLONOSCOPY N/A 09/22/2014   Procedure: COLONOSCOPY;  Surgeon: Daneil Dolin, MD;  [facility];  Service: Endoscopy;  Laterality: N/A;  9:30 AM        PT REQUEST TIME  . KNEE ARTHROSCOPY     Right   Family History:  Family History  Problem Relation Age of Onset  . Alzheimer's disease Mother   . Diabetes Father   . Heart failure Father        prostate disease   . Alzheimer's disease Sister   . Kidney failure Brother   . Diabetes Brother   . High blood pressure Brother   . Cancer Brother    Family Psychiatric  History: None noted in chart. Strong family history of dementia as per wife report.  Social History:  Social History   Substance and Sexual Activity  Alcohol Use No   Comment: Quit 1992     Social History   Substance and Sexual Activity  Drug Use No    Social History   Socioeconomic History  . Marital status: Married    Spouse name: Not on file  . Number of children: 1  . Years of education: 75  . Highest education level: Not on file  Occupational History  . Occupation: Systems developer: Probation officer  Social Needs  . Financial resource strain: Not on file  . Food insecurity:    Worry: Not on file    Inability: Not on file  . Transportation needs:    Medical: Not on file    Non-medical: Not on file  Tobacco Use  . Smoking status: Former Smoker    Last attempt to quit: 1992    Years since quitting: 28.4  . Smokeless tobacco: Never Used  Substance and Sexual Activity  . Alcohol use: No    Comment: Quit 1992  . Drug use: No  . Sexual activity: Not on file    Comment: Married  Lifestyle  . Physical activity:    Days per week: Not on file    Minutes per session: Not on file  . Stress: Not on file  Relationships  . Social connections:    Talks on phone: Not on file    Gets together: Not on file    Attends religious service: Not on file    Active member of club or  organization: Not on file    Attends meetings of clubs or organizations: Not on file    Relationship status: Not on file  Other Topics Concern  . Not on file  Social History Narrative   Lives at home w/ his wife   Right-handed   Caffeine: 1 cup daily   Education- assoc degree   Additional Social History:    Allergies:   Allergies  Allergen Reactions  . Aspirin Other (See Comments)    Stomach upset  . Namenda [Memantine Hcl] Other (See Comments)    Increased dementia behavior issues  . Naprosyn [Naproxen] Hives  . Penicillins     Did it involve swelling of the face/tongue/throat, SOB, or low BP?  Unknown Did it involve sudden or severe rash/hives, skin peeling, or any reaction on the inside of your mouth or nose? Unknown Did you need to seek medical attention at a hospital or doctor's office? Unknown When did it last happen?Unknown If all above answers are "NO", may proceed with cephalosporin use.     Labs:  Results for orders placed or performed during the hospital encounter of 05/16/19 (from the past 48 hour(s))  Urinalysis, Routine w reflex microscopic     Status: None   Collection Time: 05/16/19  8:33 PM  Result Value Ref Range   Color, Urine YELLOW YELLOW   APPearance CLEAR CLEAR   Specific Gravity, Urine 1.024 1.005 - 1.030   pH 5.0 5.0 - 8.0   Glucose, UA NEGATIVE NEGATIVE mg/dL   Hgb urine dipstick NEGATIVE NEGATIVE   Bilirubin Urine NEGATIVE NEGATIVE   Ketones, ur NEGATIVE NEGATIVE mg/dL   Protein, ur NEGATIVE NEGATIVE mg/dL   Nitrite NEGATIVE NEGATIVE   Leukocytes,Ua NEGATIVE NEGATIVE    Comment: Performed at [facility], [address], [place], [place] [address]  CBC with Differential     Status: None   Collection Time: 05/16/19  8:43 PM  Result Value Ref Range   WBC 4.4 4.0 - 10.5 K/uL   RBC 4.72 4.22 - 5.81 MIL/uL   Hemoglobin 13.3 13.0 - 17.0 g/dL   HCT 42.2 39.0 - 52.0 %   MCV 89.4 80.0 - 100.0 fL   MCH 28.2 26.0 - 34.0 pg   MCHC 31.5 30.0 -  36.0 g/dL   RDW 14.9 11.5 - 15.5 %   Platelets 180 150 - 400 K/uL   nRBC 0.0 0.0 - 0.2 %   Neutrophils Relative % 45 %   Neutro Abs 2.0 1.7 - 7.7 K/uL   Lymphocytes Relative 40 %   Lymphs Abs 1.7 0.7 - 4.0 K/uL   Monocytes Relative 11 %   Monocytes Absolute 0.5 0.1 - 1.0 K/uL   Eosinophils Relative 3 %   Eosinophils Absolute 0.1 0.0 - 0.5 K/uL   Basophils Relative 1 %   Basophils Absolute 0.0 0.0 - 0.1 K/uL   Immature Granulocytes 0 %   Abs Immature Granulocytes 0.01 0.00 - 0.07 K/uL    Comment: Performed at [facility], [address] [place], [place] [address]  Basic metabolic panel     Status: Abnormal   Collection Time: 05/16/19  8:43 PM  Result Value Ref Range   Sodium 136 135 - 145 mmol/L   Potassium 4.1 3.5 - 5.1 mmol/L   Chloride 104 98 - 111 mmol/L   CO2 23 22 - 32 mmol/L   Glucose, Bld 128 (H) 70 - 99 mg/dL   BUN 14 8 - 23 mg/dL   Creatinine, Ser 1.34 (H) 0.61 - 1.24 mg/dL   Calcium 8.8 (L) 8.9 - 10.3 mg/dL   GFR calc non Af Amer 54 (L) >60 mL/min   GFR calc Af Amer >60 >60 mL/min   Anion gap 9 5 - 15    Comment: Performed at [facility], [address] [place],  [address]  SARS Coronavirus 2 (CEPHEID - Performed in [place] hospital lab), Hosp Order     Status: None   Collection Time: 05/18/19  2:40 AM  Result Value Ref Range   SARS Coronavirus 2 NEGATIVE NEGATIVE    Comment: (NOTE) If result is NEGATIVE SARS-CoV-2 target nucleic acids are NOT DETECTED. The SARS-CoV-2 RNA is generally detectable in upper and lower  respiratory  specimens during the acute phase of infection. The lowest  concentration of SARS-CoV-2 viral copies this assay can detect is 250  copies / mL. A negative result does not preclude SARS-CoV-2 infection  and should not be used as the sole basis for treatment or other  patient management decisions.  A negative result may occur with  improper specimen collection / handling, submission of specimen other  than nasopharyngeal swab,  presence of viral mutation(s) within the  areas targeted by this assay, and inadequate number of viral copies  (<250 copies / mL). A negative result must be combined with clinical  observations, patient history, and epidemiological information. If result is POSITIVE SARS-CoV-2 target nucleic acids are DETECTED. The SARS-CoV-2 RNA is generally detectable in upper and lower  respiratory specimens dur ing the acute phase of infection.  Positive  results are indicative of active infection with SARS-CoV-2.  Clinical  correlation with patient history and other diagnostic information is  necessary to determine patient infection status.  Positive results do  not rule out bacterial infection or co-infection with other viruses. If result is PRESUMPTIVE POSTIVE SARS-CoV-2 nucleic acids MAY BE PRESENT.   A presumptive positive result was obtained on the submitted specimen  and confirmed on repeat testing.  While 2019 novel coronavirus  (SARS-CoV-2) nucleic acids may be present in the submitted sample  additional confirmatory testing may be necessary for epidemiological  and / or clinical management purposes  to differentiate between  SARS-CoV-2 and other Sarbecovirus currently known to infect humans.  If clinically indicated additional testing with an alternate test  methodology [phone]) is advised. The SARS-CoV-2 RNA is generally  detectable in upper and lower respiratory sp ecimens during the acute  phase of infection. The expected result is Negative. Fact Sheet for Patients:  StrictlyIdeas.no Fact Sheet for Healthcare Providers: BankingDealers.co.za This test is not yet approved or cleared by the [place] FDA and has been authorized for detection and/or diagnosis of SARS-CoV-2 by FDA under an Emergency Use Authorization (EUA).  This EUA will remain in effect (meaning this test can be used) for the duration of the COVID-19 declaration under  Section 564(b)(1) of the Act, 21 U.S.C. section 360bbb-3(b)(1), unless the authorization is terminated or revoked sooner. Performed at [facility], [address] [place], [place] [address]     Medications:  Current Facility-Administered Medications  Medication Dose Route Frequency Provider Last Rate Last Dose  . allopurinol (ZYLOPRIM) tablet 300 mg  300 mg Oral QHS Couture, Cortni S, PA-C   300 mg at 05/17/19 2218  . cloNIDine (CATAPRES) tablet 0.2 mg  0.2 mg Oral TID Couture, Cortni S, PA-C   0.2 mg at 05/17/19 2218  . donepezil (ARICEPT) tablet 10 mg  10 mg Oral QHS Couture, Cortni S, PA-C   10 mg at 05/17/19 2218  . QUEtiapine (SEROQUEL) tablet 25 mg  25 mg Oral QHS Sherwood Gambler, MD   25 mg at 05/17/19 2218   Current Outpatient Medications  Medication Sig Dispense Refill  . allopurinol (ZYLOPRIM) 300 MG tablet Take 300 mg by mouth at bedtime.    . cloNIDine (CATAPRES) 0.2 MG tablet TAKE 1 TABLET BY MOUTH THREE TIMES DAILY (Patient taking differently: Take 0.2-0.4 mg by mouth See admin instructions. 0.2mg  in the morning and 0.4mg  at bedtime) 270 tablet 0  . donepezil (ARICEPT) 10 MG tablet Take 1 tablet (10 mg total) by mouth at bedtime. 90 tablet 3  . QUEtiapine (SEROQUEL) 25 MG tablet TAKE 1 TABLET BY MOUTH  AT BEDTIME (Patient taking differently: Take 25 mg by mouth at bedtime. ) 30 tablet 1  . colchicine 0.6 MG tablet Please take 1.2 mg, (2 tablets) now, then 0.6 mg, (1 tablet) in 1 hour today. Then take 0.6 mg once or twice daily for the next 3 days or until diarrhea occurs. (Patient not taking: Reported on 05/16/2019) 10 tablet 1    Musculoskeletal: Unable to assess as evaluation via tele psych   Psychiatric Specialty Exam: Physical Exam  Nursing note reviewed. Constitutional: He is oriented to person, place, and time.  Neurological: He is alert and oriented to person, place, and time.    ROS  Blood pressure (!) 152/87, pulse 76, temperature 97.7 F (36.5 C), temperature  source Oral, resp. rate 16, height 5\' 10"  (1.778 m), weight 104.3 kg, SpO2 96 %.Body mass index is 33 kg/m.  General Appearance: Guarded  Eye Contact:  Poor  Speech:  Clear and Coherent  Volume:  Decreased  Mood:  Depressed  Affect:  Congruent  Thought Process:  Disorganized and Descriptions of Associations: Intact  Orientation:  Other:  DIoreinted to time, place   Thought Content:  unable to acess  Suicidal Thoughts:  No  Homicidal Thoughts:  No  Memory:  Immediate;   Poor Recent;   Poor Remote;   Poor  Judgement:  Impaired  Insight:  Shallow  Psychomotor Activity:  Restlessness  Concentration:  Concentration: Poor and Attention Span: Poor  Recall:  Poor  Fund of Knowledge:  Poor  Language:  Fair  Akathisia:  Negative  Handed:  Right  AIMS (if indicated):     Assets:  Social Support  ADL's:  Intact  Cognition:  Impaired,  Moderate  Sleep:        Treatment Plan Summary: Daily contact with patient to assess and evaluate symptoms and progress in treatment  Disposition:   There is evidence of imminent risk to self or others at present.   Patient does meet criteria for psychiatric inpatient admission. Recommend inpatient geropsychiatric hospitalization.    LCSW will begin to work on referrals.    This service was provided via telemedicine using a 2-way, interactive audio and video technology.  Names of all persons participating in this telemedicine service and their role in this encounter. Name: Mordecai Maes  Role: FNP-C  Name: Genelle Gather  Role: Patient   Name: Blythe Stanford Role: Wife    Mordecai Maes, NP 05/18/2019 12:05 PM

## 2019-05-18 NOTE — BH Assessment (Addendum)
Tele Assessment Note   Patient Name: Marvin [name] MRN: [id number] Referring Physician: Ashok Cordia Location of Patient: [facility] Location of Provider: [place] [facility]  MEHKI KLUMPP is an 68 y.o. male who presented to [facility] due to behavioral disturbance related to his dementia.  Patient has not been sleeping, has been wandering at night and trying to leave the house. Patient would say very little this morning to TTS, therefore, TTS contacted his wife for collateral information.  According to his wife, Praneel Haisley [phone], patient has not slept in thirty-six hours.  She states that patient has been becoming increasingly aggressive towards her.  Wife states that she is scared for patient to be alone because she is fearful that he is going to hurt himself.  Wife states that she works at night and has been having to leave him at home alone at night.  She states that patient has no history of mental illness with any subsequent treatment. She states that he has never been suicidal, homicidal or psychotic.  Wife states that patient has been a social drinker in the past, but never drank problematically.  She states that he has never used any drugs.  She indicated that patient has no history of self-mutilation or abuse.  Patient has been uncooperative at times while in the ED.  Initially, he was very cooperative and redirectable.  However, since he has been in the ED, he has become more agitated and combative with staff and had to be given Geodon to sedate him in order to better manage his behavior. It was reported that he kicked an Therapist, sports in the ED. Since has as been in the ED, patient has been placed on IVC.  Patient's wife states that she can no longer manage patient at home and states that she has no support.  Wife states that she feels like patient needs placement into a nursing facility.  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 Degenerative joint disease   . Dementia ([place])   . Gout   . Hemorrhoids    Dr. Romona Curls  . Hyperlipidemia   . Hypertension   . Impaired glucose tolerance   . Premature atrial contractions     Past Surgical History:  Procedure Laterality Date  . COLONOSCOPY  07/2004  . COLONOSCOPY N/A 09/22/2014   Procedure: COLONOSCOPY;  Surgeon: Daneil Dolin, MD;  [facility];  Service: Endoscopy;  Laterality: N/A;  9:30 AM        PT REQUEST TIME  . KNEE ARTHROSCOPY     Right    Family History:  Family History  Problem Relation Age of Onset  . Alzheimer's disease Mother   . Diabetes Father   . Heart failure Father        prostate disease   . Alzheimer's disease Sister   . Kidney failure Brother   . Diabetes Brother   . High blood pressure Brother   . Cancer Brother     Social History:  reports that he quit smoking about 28 years ago. He has never used smokeless tobacco. He reports that he does not drink alcohol or use drugs.  Additional Social History:  Alcohol / Drug Use Pain Medications: see MAR Prescriptions: see MAR Over the Counter: see MAR History of alcohol / drug use?: No history of alcohol / drug abuse Longest period of sobriety (when/how long): N/A  CIWA: CIWA-Ar BP: (!) 152/87  Pulse Rate: 76 COWS:    Allergies:  Allergies  Allergen Reactions  . Aspirin Other (See Comments)    Stomach upset  . Namenda [Memantine Hcl] Other (See Comments)    Increased dementia behavior issues  . Naprosyn [Naproxen] Hives  . Penicillins     Did it involve swelling of the face/tongue/throat, SOB, or low BP? Unknown Did it involve sudden or severe rash/hives, skin peeling, or any reaction on the inside of your mouth or nose? Unknown Did you need to seek medical attention at a hospital or doctor's office? Unknown When did it last happen?Unknown If all above answers are "NO", may proceed with cephalosporin use.     Home Medications:  (Not in a hospital admission)   OB/GYN Status:  No LMP for male patient.  General Assessment Data Location of Assessment: [facility] TTS Assessment: In system Is this a Tele or Face-to-Face Assessment?: Tele Assessment Is this an Initial Assessment or a Re-assessment for this encounter?: Initial Assessment Patient Accompanied by:: N/A Language Other than English: No Living Arrangements: Other (Comment)(Lives with wife) What gender do you identify as?: Male Marital status: Married Living Arrangements: Spouse/significant other Can pt return to current living arrangement?: No Admission Status: Involuntary Petitioner: ED Attending Is patient capable of signing voluntary admission?: No Referral Source: Self/Family/Friend Insurance type: Government social research officer     Crisis Care Plan Living Arrangements: Spouse/significant other Legal Guardian: Other:(self) Name of Psychiatrist: none Name of Therapist: none  Education Status Is patient currently in school?: No Is the patient employed, unemployed or receiving disability?: Receiving disability income  Risk to self with the past 6 months Suicidal Ideation: No Has patient been a risk to self within the past 6 months prior to admission? : No Suicidal Intent: No Has patient had any suicidal intent within the past 6 months prior to admission? : No Is patient at risk for suicide?: No Suicidal Plan?: No Has patient had any suicidal plan within the past 6 months prior to admission? : No Access to Means: No What has been your use of drugs/alcohol within the last 12 months?: none Previous Attempts/Gestures: No How many times?: 0 Other Self Harm Risks: wanders Triggers for Past Attempts: None known Intentional Self Injurious Behavior: None Family Suicide History: No Recent stressful life event(s): Other (Comment)(none reported) Persecutory voices/beliefs?: No Depression: No Substance abuse history and/or treatment for substance abuse?: No Suicide  prevention information given to non-admitted patients: Not applicable  Risk to Others within the past 6 months Homicidal Ideation: No Does patient have any lifetime risk of violence toward others beyond the six months prior to admission? : No Thoughts of Harm to Others: No Current Homicidal Intent: No Current Homicidal Plan: No Access to Homicidal Means: No Identified Victim: none History of harm to others?: No Assessment of Violence: None Noted Violent Behavior Description: none Does patient have access to weapons?: No Criminal Charges Pending?: No Does patient have a court date: No Is patient on probation?: No  Psychosis Hallucinations: None noted Delusions: None noted  Mental Status Report Appearance/Hygiene: Unremarkable Eye Contact: Poor Motor Activity: Restlessness Speech: Unremarkable Level of Consciousness: Alert Mood: Anxious Affect: Flat Anxiety Level: Moderate Thought Processes: Coherent, Relevant Judgement: Impaired Orientation: Person Obsessive Compulsive Thoughts/Behaviors: None  Cognitive Functioning Concentration: Decreased Memory: Recent Intact, Remote Intact Is patient IDD: No Insight: Poor Impulse Control: Poor Appetite: Fair Have you had any weight changes? : No Change Sleep: Decreased Total Hours of Sleep: (not sleeping) Vegetative Symptoms: None  ADLScreening [facility]  [facility]essment [facility]) Patient's cognitive ability adequate to safely complete daily activities?: Yes Patient able to express need for assistance with ADLs?: Yes Independently performs ADLs?: Yes (appropriate for developmental age)  Prior Inpatient Therapy Prior Inpatient Therapy: No  Prior Outpatient Therapy Prior Outpatient Therapy: No Does patient have an ACCT team?: No Does patient have Intensive In-House Services?  : No Does patient have [facility] services? : No Does patient have P4CC services?: No  ADL Screening (condition at time of admission) Patient's cognitive  ability adequate to safely complete daily activities?: Yes Is the patient deaf or have difficulty hearing?: No Does the patient have difficulty seeing, even when wearing glasses/contacts?: No Does the patient have difficulty concentrating, remembering, or making decisions?: No Patient able to express need for assistance with ADLs?: Yes Does the patient have difficulty dressing or bathing?: No Independently performs ADLs?: Yes (appropriate for developmental age) Does the patient have difficulty walking or climbing stairs?: No Weakness of Legs: None Weakness of Arms/Hands: None  Home Assistive Devices/Equipment Home Assistive Devices/Equipment: None  Therapy Consults (therapy consults require a physician order) PT Evaluation Needed: No OT Evalulation Needed: No SLP Evaluation Needed: No Abuse/Neglect Assessment (Assessment to be complete while patient is alone) Abuse/Neglect Assessment Can Be Completed: Yes Physical Abuse: Denies Verbal Abuse: Denies Sexual Abuse: Denies Exploitation of patient/patient's resources: Denies Self-Neglect: Denies Values / Beliefs Cultural Requests During Hospitalization: None Spiritual Requests During Hospitalization: None Consults Spiritual Care Consult Needed: No Social Work Consult Needed: No Regulatory affairs officer (For Healthcare) Does Patient Have a Medical Advance Directive?: No Would patient like information on creating a medical advance directive?: No - Patient declined Nutrition Screen- [facility] Adult/[facility] Has the patient recently lost weight without trying?: No Has the patient been eating poorly because of a decreased appetite?: No Malnutrition Screening Tool Score: 0        Disposition: Per Mordecai Maes, NP, patient needs geriatric Psychiatric inaptient Disposition Initial Assessment Completed for this Encounter: Yes  This service was provided via telemedicine using a 2-way, interactive audio and video technology.  Names of all persons  participating in this telemedicine service and their role in this encounter. Name: Sarah Zerby Role: Patient  Name: Kasandra Knudsen Delorus Langwell Role: TTS  Name:  Role:   Name:  Role:     Reatha Armour 05/18/2019 8:11 AM

## 2019-05-18 NOTE — ED Provider Notes (Signed)
Blood pressure (!) 164/99, pulse 66, temperature 97.7 F (36.5 C), temperature source Oral, resp. rate 18, height 5\' 10"  (1.778 m), weight 104.3 kg, SpO2 100 %.   In short, Marvin [name] is a 68 y.o. male with a chief complaint of Medical Clearance and Psychiatric Evaluation .  Refer to the original H&P for additional details.  05:34 PM  Called by nursing to see the patient is becoming more aggressive and difficult to redirect.  They are having to restrain the patient physically and have called local PD twice to assist.  He has dementia and is difficult to redirect verbally.  He was given 10 of Geodon yesterday with little improvement.  Plan for 20 of IM Geodon along with Benadryl and Ativan here. I am concerned that further escalation could endanger either the patient and/or staff. Close monitoring afterwards. QTc 444 on EKG from today.    EKG Interpretation  Date/Time:  Wednesday May 18 2019 21:18:07 EDT Ventricular Rate:  66 PR Interval:  146 QRS Duration: 95 QT Interval:  423 QTC Calculation: 444 R Axis:   30 Text Interpretation:  Age not entered, assumed to be  68 years old for purpose of ECG interpretation Sinus rhythm Atrial premature complexes No STEMI.  QTc unchanged.  Confirmed by Nanda Quinton [phone]) on 05/18/2019 9:27:15 PM       CRITICAL CARE Performed by: Margette Fast Total critical care time: 35 minutes Critical care time was exclusive of separately billable procedures and treating other patients. Critical care was necessary to treat or prevent imminent or life-threatening deterioration. Critical care was time spent personally by me on the following activities: development of treatment plan with patient and/or surrogate as well as nursing, discussions with consultants, evaluation of patient's response to treatment, examination of patient, obtaining history from patient or surrogate, ordering and performing treatments and interventions, ordering and review of laboratory  studies, ordering and review of radiographic studies, pulse oximetry and re-evaluation of patient's condition.  Nanda Quinton, MD Emergency Medicine     Long, Wonda Olds, MD 05/19/19 [phone]

## 2019-05-18 NOTE — Progress Notes (Signed)
Pt. meets criteria for inpatient treatment per Mordecai Maes, NP.  No appropriate beds available at [facility] as patient requires gero-psych. Referred out to the following hospitals:  [facility]     Disposition CSW will continue to follow for placement.  Areatha Keas. Judi Cong, MSW, [place] Disposition Clinical Social Work [phone] (cell) [phone] (office)

## 2019-05-18 NOTE — Progress Notes (Signed)
CSW in contact with [place], [place] [facility]: [phone]). DSS social worker confirmed that she did receive the LTC medicaid application from family. DSS SW went into detail and stated that family must submit further documentation to confirm information submitted on application and also that with the Pt's social security income alone, he was over the income criteria.   CSW in contact with Pt's wife Marvin [name], Ph: [phone] to discuss pts disposition. Olin Hauser engaged in conversation with CSW regarding the challenges she has been experiencing with her husbands dementia in the home. Olin Hauser reported that she would not be able to financially mange nurse aide services. Wife is tearful and receptive to verbal comfort from CSW.   CSW will continue to follow for D/C needs.  [place] [facility]  Clinical Social Worker  Ph: [phone]

## 2019-05-18 NOTE — ED Notes (Signed)
Patient out of bed. Patient in hallway, urinated in the hallway . Redirected patient back to room, called Security to room for assistance. Patient sitting on bed, got back up urinated in the bathroom floor and on himself. Patient pulled pants off trying to go back out in the hall. Patient covered himself with blanket. RN in the room at this time.

## 2019-05-18 NOTE — ED Notes (Signed)
Pt walking out into the hallway with sitter attempting to get pt to return to his room. Multiple RNs and NTs came to help assist sitter in redirecting pt back to his room. Pt becoming very agitated and pushing staff to get out of his way. Pt stating, "I'm going home". [employer] called to bedside and was able to get pt to sit down in the wheelchair and pt was returned to his room safely.

## 2019-05-18 NOTE — ED Notes (Signed)
Pt had been redirectable throughout the night, pt then attempting to walk into different patient rooms and not as directable as before. Pt then became physically aggressive towards staff and security. Decision made to IVC pt at this time. Medication orders also obtained. Pt currently in handcuffs with [employer] and security staff at bedside.

## 2019-05-18 NOTE — ED Notes (Addendum)
02:30 Pt has been redirected all night to stay in room,pt is becoming harder to redirected.Pt is showing aggression having hard time keeping him in room all together at this time,pt wondering in other patients rooms.Security and [employer] is assisting with pt at this time.

## 2019-05-18 NOTE — ED Notes (Signed)
[employer] sign off at this time

## 2019-05-18 NOTE — ED Provider Notes (Signed)
Patient with hx dementia, with agitated/aggressive behavior towards staff, trying to leave ED. At home, reports or being danger to self, wandering away from home and across busy roads, aggressive and paranoid behaviors. IVC completed as pt represents harm to self. Patient with agitated behavior in ED. Attempts by ED staff to reassure and calm patient - pt continues to exhibit agitated, aggressive behavior. geodon 10 mg im for symptom improvement. Security and Event organiser to ensure safety.   Recheck pt, calm, resting quietly. Pulse ox 98%, rr 16.   SW/CM working towards placement of patient to resume in AM. Will also obtain BH/TTS consult for possible geropsych placement and BH med management.      Lajean Saver, MD 05/18/19 [phone]

## 2019-05-18 NOTE — Progress Notes (Addendum)
[place] requesting chest x-ray, UDS, and additional nursing notes. Meghan at [facility] ED notified.   Audree Camel, LCSW, LCAS Disposition CSW [facility] BHH/TTS [phone]  UPDATE:  All requested medical info (EKG, chest x-ray, nursing notes, UDS) has been sent to [facility] for review.

## 2019-05-19 DIAGNOSIS — R9431 Abnormal electrocardiogram [ECG] [EKG]: Secondary | ICD-10-CM | POA: Diagnosis not present

## 2019-05-19 DIAGNOSIS — G301 Alzheimer's disease with late onset: Secondary | ICD-10-CM | POA: Diagnosis not present

## 2019-05-19 DIAGNOSIS — E876 Hypokalemia: Secondary | ICD-10-CM | POA: Diagnosis not present

## 2019-05-19 DIAGNOSIS — M109 Gout, unspecified: Secondary | ICD-10-CM | POA: Diagnosis not present

## 2019-05-19 DIAGNOSIS — R7302 Impaired glucose tolerance (oral): Secondary | ICD-10-CM | POA: Diagnosis not present

## 2019-05-19 DIAGNOSIS — E785 Hyperlipidemia, unspecified: Secondary | ICD-10-CM | POA: Diagnosis not present

## 2019-05-19 DIAGNOSIS — K5900 Constipation, unspecified: Secondary | ICD-10-CM | POA: Diagnosis not present

## 2019-05-19 DIAGNOSIS — R69 Illness, unspecified: Secondary | ICD-10-CM | POA: Diagnosis not present

## 2019-05-19 DIAGNOSIS — E871 Hypo-osmolality and hyponatremia: Secondary | ICD-10-CM | POA: Diagnosis not present

## 2019-05-19 DIAGNOSIS — I10 Essential (primary) hypertension: Secondary | ICD-10-CM | POA: Diagnosis not present

## 2019-05-19 DIAGNOSIS — R740 Nonspecific elevation of levels of transaminase and lactic acid dehydrogenase [LDH]: Secondary | ICD-10-CM | POA: Diagnosis not present

## 2019-05-19 DIAGNOSIS — E669 Obesity, unspecified: Secondary | ICD-10-CM | POA: Diagnosis not present

## 2019-05-19 DIAGNOSIS — F0390 Unspecified dementia without behavioral disturbance: Secondary | ICD-10-CM | POA: Diagnosis not present

## 2019-05-19 DIAGNOSIS — F0281 Dementia in other diseases classified elsewhere with behavioral disturbance: Secondary | ICD-10-CM | POA: Diagnosis not present

## 2019-05-19 DIAGNOSIS — E559 Vitamin D deficiency, unspecified: Secondary | ICD-10-CM | POA: Diagnosis not present

## 2019-05-19 DIAGNOSIS — E538 Deficiency of other specified B group vitamins: Secondary | ICD-10-CM | POA: Diagnosis not present

## 2019-05-19 MED ORDER — DIPHENHYDRAMINE HCL 50 MG/ML IJ SOLN: 50.0000 mg | Freq: Once | INTRAMUSCULAR | Status: DC | PRN

## 2019-05-19 MED ORDER — ZIPRASIDONE MESYLATE 20 MG IM SOLR: 20.0000 mg | Freq: Once | INTRAMUSCULAR | Status: DC

## 2019-05-19 NOTE — Progress Notes (Signed)
CSW faxed IVC paperwork to [facility] at their request.   Rhae Hammock Disposition CSW [facility] BHH/TTS [phone]

## 2019-05-19 NOTE — ED Notes (Signed)
Pt in bathroom in room pulling out paper towels and wetting them.  Agitated, pacing back and forth in room. Re directed pt back to chair in room

## 2019-05-19 NOTE — BH Assessment (Signed)
Reassessment note: Pt presents lying in hospital bed. He attempted to keep his eyes open, but mostly eyes were closed. Pt spoke very softly, inaudible at times. When asked if he felt mad, sad or happy, pt responded he felt sad. Pt did not clearly answer any other questions. Inpt tx continues to be recommendation.

## 2019-05-19 NOTE — Progress Notes (Signed)
CSW 2nd shift ED CSW received a call from [facility] Wife. Marvin [name] inquired about personal care services and nurse aide assistance if she were to take her husband home. CSW informed Marvin [name] that her options would consist of private pay services more than likely. CSW advised that family contact the adult aging, disability and transit services to inquire about home services available to her.    [facility]  Clinical Social Worker  Ph: [phone]

## 2019-05-19 NOTE — Progress Notes (Signed)
Pt accepted to [facility].  Dr. Johna Roles is the accepting/attending provider.   Call report to [phone]  [facility] [place] ED notified.    Pt is involuntary and will be transported by law enforcement Pt may arrive at [facility] as soon as transportation is arranged.   Audree Camel, LCSW, [place] Disposition [facility] BHH/TTS [phone]

## 2019-05-19 NOTE — ED Provider Notes (Signed)
Blood pressure (!) 179/82, pulse 64, temperature 97.7 F (36.5 C), temperature source Oral, resp. rate 18, height 5\' 10"  (1.778 m), weight 104.3 kg, SpO2 98 %.  In short, Marvin [name] is a 68 y.o. male with a chief complaint of Medical Clearance and Psychiatric Evaluation .  Refer to the original H&P for additional details.  05:20 PM  Patient is again becoming increasingly agitated.  He is becoming more difficult to redirect and aggressive.  Will fortunately have to give Geodon and Benadryl again to keep the patient and staff safe.  I have obtained a repeat EKG. QTc 412.    EKG Interpretation  Date/Time:  Wednesday May 18 2019 21:18:07 EDT Ventricular Rate:  66 PR Interval:  146 QRS Duration: 95 QT Interval:  423 QTC Calculation: 444 R Axis:   30 Text Interpretation:  Age not entered, assumed to be  68 years old for purpose of ECG interpretation Sinus rhythm Atrial premature complexes No STEMI.  QTc unchanged.  Confirmed by Nanda Quinton [phone]) on 05/18/2019 9:27:15 PM        CRITICAL CARE Performed by: Margette Fast Total critical care time: 35 minutes Critical care time was exclusive of separately billable procedures and treating other patients. Critical care was necessary to treat or prevent imminent or life-threatening deterioration. Critical care was time spent personally by me on the following activities: development of treatment plan with patient and/or surrogate as well as nursing, discussions with consultants, evaluation of patient's response to treatment, examination of patient, obtaining history from patient or surrogate, ordering and performing treatments and interventions, ordering and review of laboratory studies, ordering and review of radiographic studies, pulse oximetry and re-evaluation of patient's condition.  Nanda Quinton, MD Emergency Medicine     Long, Wonda Olds, MD 05/20/19 [phone]

## 2019-05-19 NOTE — ED Notes (Signed)
Set up pt's supper tray

## 2019-05-20 DIAGNOSIS — R69 Illness, unspecified: Secondary | ICD-10-CM | POA: Diagnosis not present

## 2019-05-20 DIAGNOSIS — K5900 Constipation, unspecified: Secondary | ICD-10-CM | POA: Diagnosis not present

## 2019-05-20 DIAGNOSIS — E785 Hyperlipidemia, unspecified: Secondary | ICD-10-CM | POA: Diagnosis not present

## 2019-05-20 DIAGNOSIS — E876 Hypokalemia: Secondary | ICD-10-CM | POA: Diagnosis not present

## 2019-05-20 DIAGNOSIS — R7302 Impaired glucose tolerance (oral): Secondary | ICD-10-CM | POA: Diagnosis not present

## 2019-05-20 DIAGNOSIS — E669 Obesity, unspecified: Secondary | ICD-10-CM | POA: Diagnosis not present

## 2019-05-20 DIAGNOSIS — I10 Essential (primary) hypertension: Secondary | ICD-10-CM | POA: Diagnosis not present

## 2019-05-20 DIAGNOSIS — E871 Hypo-osmolality and hyponatremia: Secondary | ICD-10-CM | POA: Diagnosis not present

## 2019-05-20 DIAGNOSIS — E559 Vitamin D deficiency, unspecified: Secondary | ICD-10-CM | POA: Diagnosis not present

## 2019-05-20 DIAGNOSIS — M109 Gout, unspecified: Secondary | ICD-10-CM | POA: Diagnosis not present

## 2019-05-21 DIAGNOSIS — R69 Illness, unspecified: Secondary | ICD-10-CM | POA: Diagnosis not present

## 2019-05-21 DIAGNOSIS — R7302 Impaired glucose tolerance (oral): Secondary | ICD-10-CM | POA: Diagnosis not present

## 2019-05-21 DIAGNOSIS — I10 Essential (primary) hypertension: Secondary | ICD-10-CM | POA: Diagnosis not present

## 2019-05-21 DIAGNOSIS — M109 Gout, unspecified: Secondary | ICD-10-CM | POA: Diagnosis not present

## 2019-05-21 DIAGNOSIS — E876 Hypokalemia: Secondary | ICD-10-CM | POA: Diagnosis not present

## 2019-05-22 DIAGNOSIS — R69 Illness, unspecified: Secondary | ICD-10-CM | POA: Diagnosis not present

## 2019-05-23 DIAGNOSIS — F0281 Dementia in other diseases classified elsewhere with behavioral disturbance: Secondary | ICD-10-CM | POA: Diagnosis not present

## 2019-05-23 DIAGNOSIS — G301 Alzheimer's disease with late onset: Secondary | ICD-10-CM | POA: Diagnosis not present

## 2019-05-23 DIAGNOSIS — I10 Essential (primary) hypertension: Secondary | ICD-10-CM | POA: Diagnosis not present

## 2019-05-23 DIAGNOSIS — R9431 Abnormal electrocardiogram [ECG] [EKG]: Secondary | ICD-10-CM | POA: Insufficient documentation

## 2019-05-23 DIAGNOSIS — M109 Gout, unspecified: Secondary | ICD-10-CM | POA: Diagnosis not present

## 2019-05-23 DIAGNOSIS — E876 Hypokalemia: Secondary | ICD-10-CM | POA: Diagnosis not present

## 2019-05-23 DIAGNOSIS — R7301 Impaired fasting glucose: Secondary | ICD-10-CM | POA: Insufficient documentation

## 2019-05-24 DIAGNOSIS — G301 Alzheimer's disease with late onset: Secondary | ICD-10-CM | POA: Diagnosis not present

## 2019-05-24 DIAGNOSIS — E559 Vitamin D deficiency, unspecified: Secondary | ICD-10-CM | POA: Insufficient documentation

## 2019-05-24 DIAGNOSIS — F0281 Dementia in other diseases classified elsewhere with behavioral disturbance: Secondary | ICD-10-CM | POA: Diagnosis not present

## 2019-05-25 DIAGNOSIS — G301 Alzheimer's disease with late onset: Secondary | ICD-10-CM | POA: Diagnosis not present

## 2019-05-25 DIAGNOSIS — F0281 Dementia in other diseases classified elsewhere with behavioral disturbance: Secondary | ICD-10-CM | POA: Diagnosis not present

## 2019-05-25 DIAGNOSIS — E559 Vitamin D deficiency, unspecified: Secondary | ICD-10-CM | POA: Diagnosis not present

## 2019-05-25 DIAGNOSIS — I10 Essential (primary) hypertension: Secondary | ICD-10-CM | POA: Diagnosis not present

## 2019-05-25 DIAGNOSIS — K5900 Constipation, unspecified: Secondary | ICD-10-CM | POA: Diagnosis not present

## 2019-05-26 ENCOUNTER — Telehealth: Payer: Self-pay | Admitting: *Deleted

## 2019-05-26 DIAGNOSIS — F0281 Dementia in other diseases classified elsewhere with behavioral disturbance: Secondary | ICD-10-CM | POA: Diagnosis not present

## 2019-05-26 DIAGNOSIS — G301 Alzheimer's disease with late onset: Secondary | ICD-10-CM | POA: Diagnosis not present

## 2019-05-26 NOTE — Telephone Encounter (Signed)
Pls explain we can fill out the fL2 when he is ready for d/c so we have correct diagnoses and medications listed, when that is availabel please fill out, I will review, and sign, thank you

## 2019-05-26 NOTE — Telephone Encounter (Signed)
Olin Hauser pts wife (ON DPR) called stated Dr. Moshe Cipro had filled out an FL2 in the past for [facility]. They denied him said he had behavioral issues and he did not need placement. She is requesting another FL2 so she can take it to social services. She is trying to get help with Mr. Brunty as she works at night and is having a hard time taking care of him. She said he is currently at [facility] on the behavioral unit and she is trying to figure out what to do before they send him home. She doesn't have the money to pay out of pocket to place him somewhere and she is working on getting paper work for [employer] to help. She said it is ok to leave message on home phone if you cannot get her.

## 2019-05-27 DIAGNOSIS — K5900 Constipation, unspecified: Secondary | ICD-10-CM | POA: Diagnosis not present

## 2019-05-27 DIAGNOSIS — E559 Vitamin D deficiency, unspecified: Secondary | ICD-10-CM | POA: Diagnosis not present

## 2019-05-27 DIAGNOSIS — F0281 Dementia in other diseases classified elsewhere with behavioral disturbance: Secondary | ICD-10-CM | POA: Diagnosis not present

## 2019-05-27 DIAGNOSIS — G301 Alzheimer's disease with late onset: Secondary | ICD-10-CM | POA: Diagnosis not present

## 2019-05-27 DIAGNOSIS — I10 Essential (primary) hypertension: Secondary | ICD-10-CM | POA: Diagnosis not present

## 2019-05-28 DIAGNOSIS — G301 Alzheimer's disease with late onset: Secondary | ICD-10-CM | POA: Diagnosis not present

## 2019-05-28 DIAGNOSIS — F0281 Dementia in other diseases classified elsewhere with behavioral disturbance: Secondary | ICD-10-CM | POA: Diagnosis not present

## 2019-05-29 DIAGNOSIS — F0281 Dementia in other diseases classified elsewhere with behavioral disturbance: Secondary | ICD-10-CM | POA: Diagnosis not present

## 2019-05-29 DIAGNOSIS — E538 Deficiency of other specified B group vitamins: Secondary | ICD-10-CM | POA: Diagnosis not present

## 2019-05-29 DIAGNOSIS — G301 Alzheimer's disease with late onset: Secondary | ICD-10-CM | POA: Diagnosis not present

## 2019-05-29 DIAGNOSIS — I10 Essential (primary) hypertension: Secondary | ICD-10-CM | POA: Diagnosis not present

## 2019-05-30 DIAGNOSIS — R69 Illness, unspecified: Secondary | ICD-10-CM | POA: Diagnosis not present

## 2019-05-30 NOTE — Telephone Encounter (Signed)
Wife states she will call us when she knows when he will be released and make sure she has an updated list of med

## 2019-05-31 DIAGNOSIS — R69 Illness, unspecified: Secondary | ICD-10-CM | POA: Diagnosis not present

## 2019-05-31 DIAGNOSIS — R9431 Abnormal electrocardiogram [ECG] [EKG]: Secondary | ICD-10-CM | POA: Diagnosis not present

## 2019-05-31 DIAGNOSIS — E876 Hypokalemia: Secondary | ICD-10-CM | POA: Diagnosis not present

## 2019-05-31 DIAGNOSIS — R7302 Impaired glucose tolerance (oral): Secondary | ICD-10-CM | POA: Diagnosis not present

## 2019-05-31 DIAGNOSIS — E669 Obesity, unspecified: Secondary | ICD-10-CM | POA: Diagnosis not present

## 2019-05-31 DIAGNOSIS — I10 Essential (primary) hypertension: Secondary | ICD-10-CM | POA: Diagnosis not present

## 2019-05-31 DIAGNOSIS — E559 Vitamin D deficiency, unspecified: Secondary | ICD-10-CM | POA: Diagnosis not present

## 2019-05-31 DIAGNOSIS — E538 Deficiency of other specified B group vitamins: Secondary | ICD-10-CM | POA: Diagnosis not present

## 2019-05-31 DIAGNOSIS — M109 Gout, unspecified: Secondary | ICD-10-CM | POA: Diagnosis not present

## 2019-05-31 DIAGNOSIS — E785 Hyperlipidemia, unspecified: Secondary | ICD-10-CM | POA: Diagnosis not present

## 2019-06-01 DIAGNOSIS — R69 Illness, unspecified: Secondary | ICD-10-CM | POA: Diagnosis not present

## 2019-06-02 DIAGNOSIS — E559 Vitamin D deficiency, unspecified: Secondary | ICD-10-CM | POA: Diagnosis not present

## 2019-06-02 DIAGNOSIS — E538 Deficiency of other specified B group vitamins: Secondary | ICD-10-CM | POA: Diagnosis not present

## 2019-06-02 DIAGNOSIS — M109 Gout, unspecified: Secondary | ICD-10-CM | POA: Diagnosis not present

## 2019-06-02 DIAGNOSIS — E785 Hyperlipidemia, unspecified: Secondary | ICD-10-CM | POA: Diagnosis not present

## 2019-06-02 DIAGNOSIS — R7302 Impaired glucose tolerance (oral): Secondary | ICD-10-CM | POA: Diagnosis not present

## 2019-06-02 DIAGNOSIS — R69 Illness, unspecified: Secondary | ICD-10-CM | POA: Diagnosis not present

## 2019-06-02 DIAGNOSIS — E669 Obesity, unspecified: Secondary | ICD-10-CM | POA: Diagnosis not present

## 2019-06-02 DIAGNOSIS — E876 Hypokalemia: Secondary | ICD-10-CM | POA: Diagnosis not present

## 2019-06-02 DIAGNOSIS — I10 Essential (primary) hypertension: Secondary | ICD-10-CM | POA: Diagnosis not present

## 2019-06-02 DIAGNOSIS — R9431 Abnormal electrocardiogram [ECG] [EKG]: Secondary | ICD-10-CM | POA: Diagnosis not present

## 2019-06-03 DIAGNOSIS — R7401 Elevation of levels of liver transaminase levels: Secondary | ICD-10-CM | POA: Insufficient documentation

## 2019-06-03 DIAGNOSIS — R69 Illness, unspecified: Secondary | ICD-10-CM | POA: Diagnosis not present

## 2019-06-04 DIAGNOSIS — E871 Hypo-osmolality and hyponatremia: Secondary | ICD-10-CM | POA: Diagnosis not present

## 2019-06-04 DIAGNOSIS — I10 Essential (primary) hypertension: Secondary | ICD-10-CM | POA: Diagnosis not present

## 2019-06-04 DIAGNOSIS — K5900 Constipation, unspecified: Secondary | ICD-10-CM | POA: Insufficient documentation

## 2019-06-04 DIAGNOSIS — R9431 Abnormal electrocardiogram [ECG] [EKG]: Secondary | ICD-10-CM | POA: Diagnosis not present

## 2019-06-04 DIAGNOSIS — R740 Nonspecific elevation of levels of transaminase and lactic acid dehydrogenase [LDH]: Secondary | ICD-10-CM | POA: Diagnosis not present

## 2019-06-04 DIAGNOSIS — R69 Illness, unspecified: Secondary | ICD-10-CM | POA: Diagnosis not present

## 2019-06-05 DIAGNOSIS — R69 Illness, unspecified: Secondary | ICD-10-CM | POA: Diagnosis not present

## 2019-06-06 DIAGNOSIS — R69 Illness, unspecified: Secondary | ICD-10-CM | POA: Diagnosis not present

## 2019-06-06 DIAGNOSIS — R9431 Abnormal electrocardiogram [ECG] [EKG]: Secondary | ICD-10-CM | POA: Diagnosis not present

## 2019-06-06 DIAGNOSIS — E871 Hypo-osmolality and hyponatremia: Secondary | ICD-10-CM | POA: Diagnosis not present

## 2019-06-06 DIAGNOSIS — I10 Essential (primary) hypertension: Secondary | ICD-10-CM | POA: Diagnosis not present

## 2019-06-06 DIAGNOSIS — K5900 Constipation, unspecified: Secondary | ICD-10-CM | POA: Diagnosis not present

## 2019-06-07 ENCOUNTER — Telehealth: Payer: Self-pay | Admitting: Family Medicine

## 2019-06-07 DIAGNOSIS — R69 Illness, unspecified: Secondary | ICD-10-CM | POA: Diagnosis not present

## 2019-06-08 DIAGNOSIS — R69 Illness, unspecified: Secondary | ICD-10-CM | POA: Diagnosis not present

## 2019-06-09 ENCOUNTER — Other Ambulatory Visit: Payer: Self-pay

## 2019-06-09 ENCOUNTER — Ambulatory Visit (INDEPENDENT_AMBULATORY_CARE_PROVIDER_SITE_OTHER): Payer: Medicare HMO | Admitting: Family Medicine

## 2019-06-09 ENCOUNTER — Encounter: Payer: Self-pay | Admitting: Family Medicine

## 2019-06-09 ENCOUNTER — Encounter (INDEPENDENT_AMBULATORY_CARE_PROVIDER_SITE_OTHER): Payer: Self-pay

## 2019-06-09 VITALS — Ht 70.0 in | Wt 222.0 lb

## 2019-06-09 DIAGNOSIS — R69 Illness, unspecified: Secondary | ICD-10-CM | POA: Diagnosis not present

## 2019-06-09 DIAGNOSIS — Z09 Encounter for follow-up examination after completed treatment for conditions other than malignant neoplasm: Secondary | ICD-10-CM

## 2019-06-09 DIAGNOSIS — F0391 Unspecified dementia with behavioral disturbance: Secondary | ICD-10-CM

## 2019-06-09 NOTE — Progress Notes (Signed)
Virtual Visit via Telephone Note  I connected with Marvin [name] on 06/09/19 at  1:00 PM EDT by telephone and verified that I am speaking with the correct person using two identifiers.  Location: Patient: home Provider: office   I discussed the limitations, risks, security and privacy concerns of performing an evaluation and management service by telephone and the availability of in person appointments. I also discussed with the patient that there may be a patient responsible charge related to this service. The patient expressed understanding and agreed to proceed. This visit type is conducted due to national recommendations for restrictions regarding the COVID -19 Pandemic. Due to the patient's age and / or co morbidities, this format is felt to be most appropriate at this time without adequate follow up. The patient has no access to video technology/ had technical difficulties with video, requiring transitioning to audio format  only ( telephone ). All issues noted this document were discussed and addressed,no physical exam can be performed in this format. Visit carried out with his wife as the patein et is incapable Discharge summary from [facility] reviewed as well as the medications were reconciled     History of Present Illness: Ptahas severe dementia with behavioral disturbance and at times has bee considered a threat to both himself and his family Though discharged home, his wife is very concerned that she is unable to care for him, especially as she has to work and also because the  severity of his illness, states there are times since his discharge that he has referred to her as his sister Requesting help for placement in a SNF which in myjudgement is reasonable   Observations/Objective: Ht 5\' 10"  (1.778 m)   Wt 222 lb (100.7 kg)   BMI 31.85 kg/m    Assessment and Plan: Hospital discharge follow-up Patient in for follow up of recent hospitalization. Discharge summary,  and laboratory and radiology data are reviewed, and any questions or concerns about recent hospitalization are discussed. Specific issues requiring follow up are specifically addressed.   Dementia with behavioral problem ([place]) Significant deterioration in past year, pt best  Cared for in a protected environment, will do as I am able to facilitate long term care placemen    Follow Up Instructions:    I discussed the assessment and treatment plan with the patient. The patient was provided an opportunity to ask questions and all were answered. The patient agreed with the plan and demonstrated an understanding of the instructions.   The patient was advised to call back or seek an in-person evaluation if the symptoms worsen or if the condition fails to improve as anticipated.  I provided 28 minutes of non-face-to-face time during this encounter.   Tula Nakayama, MD

## 2019-06-09 NOTE — Patient Instructions (Signed)
F/U with telephone or video  Visit in 8 weeks, call if you need me before..   Thankful that you were able to get medication and treatment that you needed in the hospital, and I hope that this will make you feel better.  I will reach out to your social worker, Ms Marcello Moores at [phone] as I just discussed and get back in touch  Thanks for choosing [facility], we consider it a privelige to serve you.

## 2019-06-20 ENCOUNTER — Encounter: Payer: Self-pay | Admitting: Adult Health

## 2019-06-20 ENCOUNTER — Other Ambulatory Visit: Payer: Self-pay

## 2019-06-20 ENCOUNTER — Ambulatory Visit: Payer: Medicare HMO | Admitting: Adult Health

## 2019-06-20 VITALS — BP 130/60 | HR 58 | Temp 99.1°F | Ht 70.0 in | Wt 224.6 lb

## 2019-06-20 DIAGNOSIS — F0391 Unspecified dementia with behavioral disturbance: Secondary | ICD-10-CM | POA: Diagnosis not present

## 2019-06-20 DIAGNOSIS — R69 Illness, unspecified: Secondary | ICD-10-CM | POA: Diagnosis not present

## 2019-06-20 NOTE — Patient Instructions (Signed)
Your Plan:  Continue Aricept Keep appt with psychiatrist If your symptoms worsen or you develop new symptoms please let us know.   Thank you for coming to see [place] at [facility]. I hope we have been able to provide you high quality care today.  You may receive a patient satisfaction survey over the next few weeks. We would appreciate your feedback and comments so that we may continue to improve ourselves and the health of our patients.

## 2019-06-20 NOTE — Progress Notes (Signed)
I have read the note, and I agree with the clinical assessment and plan.  Jarret Torre K Moris Ratchford   

## 2019-06-20 NOTE — Progress Notes (Signed)
PATIENT: Marvin [name] DOB: [birth year]  REASON FOR VISIT: follow up HISTORY FROM: patient  HISTORY OF PRESENT ILLNESS: Today 06/20/19:  [name] is a 68 year old male with a history of memory disturbance.  He joins me today for follow-up.  His wife is with him.  She states that earlier this month he was admitted at [facility] for medical for behavioral issues.  She states that he was aggressive with her but not physical.  His medication was switched from Seroquel to Risperdal.  She states initially the change in medication was helpful however she states that he has began wandering off again.  She reports that she works third shift and has someone stay with him during the night.  She has to sleep during the day.  He requires assistance with all ADLs.  He no longer operates a Teacher, music.  She manages his medications and finances.  She reports that she has looked into a memory facility but has met some roadblocks with this.  She does acknowledge that she does need more assistance with him.  HISTORY 10/19/18:  [name] is a 68 year old male with a history of memory disturbance.  He returns today for follow-up.  He is here with his wife.  His wife reports that he has been having hallucinations.  She reports that he sees people in the bathroom and does not want to go in. He states that there are children in the house and he states that they need to move. She states that he tends to let stray cats in during the night.  He will also wander outside of the house.  She reports that he continues to talk about seeing his parents who have been deceased.  He requires assistance with all ADLs.  She has been able to clear all his meals.  He is able to feed himself.  She states that he often will be excluded appropriately.  For example he will make himself cereal with milk but put a salad on top of the cereal.  She did start Namenda but it made his behavior worse so they discontinued his medication.  He  remains on Aricept.  He returns today for evaluation.  REVIEW OF SYSTEMS: Out of a complete 14 system review of symptoms, the patient complains only of the following symptoms, and all other reviewed systems are negative.  See HPI  ALLERGIES: Allergies  Allergen Reactions  . Aspirin Other (See Comments)    Stomach upset  . Namenda [Memantine Hcl] Other (See Comments)    Increased dementia behavior issues  . Naprosyn [Naproxen] Hives  . Penicillins     Did it involve swelling of the face/tongue/throat, SOB, or low BP? Unknown Did it involve sudden or severe rash/hives, skin peeling, or any reaction on the inside of your mouth or nose? Unknown Did you need to seek medical attention at a hospital or doctor's office? Unknown When did it last happen?Unknown If all above answers are "NO", may proceed with cephalosporin use.     HOME MEDICATIONS: Outpatient Medications Prior to Visit  Medication Sig Dispense Refill  . allopurinol (ZYLOPRIM) 300 MG tablet Take 300 mg by mouth at bedtime.    Marland Kitchen amLODipine (NORVASC) 10 MG tablet Take 10 mg by mouth daily.    Marland Kitchen atorvastatin (LIPITOR) 40 MG tablet Take 40 mg by mouth daily.    . cholecalciferol (VITAMIN D3) 25 MCG (1000 UT) tablet Take 1,000 Units by mouth daily.    . cloNIDine (CATAPRES) 0.2  MG tablet TAKE 1 TABLET BY MOUTH THREE TIMES DAILY (Patient taking differently: Take 0.2-0.4 mg by mouth See admin instructions. 0.45m in the morning and 0.466mat bedtime) 270 tablet 0  . donepezil (ARICEPT) 10 MG tablet Take 1 tablet (10 mg total) by mouth at bedtime. 90 tablet 3  . LORazepam (ATIVAN) 1 MG tablet Take 0.5 mg by mouth daily.    . mirtazapine (REMERON) 15 MG tablet Take 15 mg by mouth at bedtime.    . polyethylene glycol (MIRALAX / GLYCOLAX) 17 g packet Take 17 g by mouth daily.    . psyllium (REGULOID) 0.52 g capsule Take 0.52 g by mouth daily.    . risperiDONE (RISPERDAL) 0.5 MG tablet Take 0.5 mg by mouth at bedtime.    .  risperiDONE (RISPERDAL) 1 MG tablet Take 1 mg by mouth at bedtime.    . traZODone (DESYREL) 150 MG tablet Take by mouth at bedtime.    . Valproate Sodium (DEPAKENE) 250 MG/5ML SOLN solution Take 500 mg by mouth every 12 (twelve) hours.    . vitamin B-12 (CYANOCOBALAMIN) 1000 MCG tablet Take 1,000 mcg by mouth daily.     No facility-administered medications prior to visit.     PAST MEDICAL HISTORY: Past Medical History:  Diagnosis Date  . Degenerative joint disease   . Dementia ([place]  . Gout   . Hemorrhoids    Dr. BrRomona Curls. Hyperlipidemia   . Hypertension   . Impaired glucose tolerance   . Premature atrial contractions     PAST SURGICAL HISTORY: Past Surgical History:  Procedure Laterality Date  . COLONOSCOPY  07/2004  . COLONOSCOPY N/A 09/22/2014   Procedure: COLONOSCOPY;  Surgeon: RoDaneil DolinMD;  Location: [facility];  Service: Endoscopy;  Laterality: N/A;  9:30 AM        PT REQUEST TIME  . KNEE ARTHROSCOPY     Right    FAMILY HISTORY: Family History  Problem Relation Age of Onset  . Alzheimer's disease Mother   . Diabetes Father   . Heart failure Father        prostate disease   . Alzheimer's disease Sister   . Kidney failure Brother   . Diabetes Brother   . High blood pressure Brother   . Cancer Brother     SOCIAL HISTORY: Social History   Socioeconomic History  . Marital status: Married    Spouse name: Not on file  . Number of children: 1  . Years of education: 1232. Highest education level: Not on file  Occupational History  . Occupation: TeSystems developerFRProbation officerSocial Needs  . Financial resource strain: Not on file  . Food insecurity    Worry: Not on file    Inability: Not on file  . Transportation needs    Medical: Not on file    Non-medical: Not on file  Tobacco Use  . Smoking status: Former Smoker    Quit date: 1992    Years since quitting: 28.5  . Smokeless tobacco: Never Used  Substance and Sexual  Activity  . Alcohol use: No    Comment: Quit 1992  . Drug use: No  . Sexual activity: Not on file    Comment: Married  Lifestyle  . Physical activity    Days per week: Not on file    Minutes per session: Not on file  . Stress: Not on file  Relationships  . Social connections  Talks on phone: Not on file    Gets together: Not on file    Attends religious service: Not on file    Active member of club or organization: Not on file    Attends meetings of clubs or organizations: Not on file    Relationship status: Not on file  . Intimate partner violence    Fear of current or ex partner: Not on file    Emotionally abused: Not on file    Physically abused: Not on file    Forced sexual activity: Not on file  Other Topics Concern  . Not on file  Social History Narrative   Lives at home w/ his wife   Right-handed   Caffeine: 1 cup daily   Education- assoc degree      PHYSICAL EXAM  Vitals:   06/20/19 1033  BP: 130/60  Pulse: (!) 58  Temp: 99.1 F (37.3 C)  TempSrc: Oral  Weight: 224 lb 9.6 oz (101.9 kg)  Height: _0  (1.778 m)   Body mass index is 32.23 kg/m.  Generalized: Well developed, in no acute distress   Neurological examination  Mentation: Alert.. Follows all commands speech and language fluent Cranial nerve II-XII: Pupils were equal round reactive to light. Extraocular movements were full, visual field were full on confrontational test. Facial sensation and strength were normal. Uvula tongue midline. Head turning and shoulder shrug  were normal and symmetric. Motor: The motor testing reveals 5 over 5 strength of all 4 extremities. Good symmetric motor tone is noted throughout.  Sensory: Sensory testing is intact to soft touch on all 4 extremities. No evidence of extinction is noted.  Coordination: Cerebellar testing reveals good finger-nose-finger and heel-to-shin bilaterally.  Gait and station: Gait is normal.    DIAGNOSTIC DATA (LABS, IMAGING,  TESTING) - I reviewed patient records, labs, notes, testing and imaging myself where available.  Lab Results  Component Value Date   WBC 4.4 05/16/2019   HGB 13.3 05/16/2019   HCT 42.2 05/16/2019   MCV 89.4 05/16/2019   PLT 180 05/16/2019      Component Value Date/Time   NA 136 05/16/2019 2043   NA 143 10/27/2017 1036   K 4.1 05/16/2019 2043   CL 104 05/16/2019 2043   CO2 23 05/16/2019 2043   GLUCOSE 128 (H) 05/16/2019 2043   BUN 14 05/16/2019 2043   BUN 10 10/27/2017 1036   CREATININE 1.34 (H) 05/16/2019 2043   CREATININE 1.03 04/05/2018 0904   CALCIUM 8.8 (L) 05/16/2019 2043   PROT 7.0 04/05/2018 0904   PROT 7.4 10/27/2017 1036   ALBUMIN 4.4 10/27/2017 1036   AST 16 04/05/2018 0904   ALT 16 04/05/2018 0904   ALKPHOS 134 (H) 10/27/2017 1036   BILITOT 0.6 04/05/2018 0904   BILITOT 0.5 10/27/2017 1036   GFRNONAA 54 (L) 05/16/2019 2043   GFRNONAA 75 04/05/2018 0904   GFRAA >60 05/16/2019 2043   GFRAA 87 04/05/2018 0904   Lab Results  Component Value Date   CHOL 222 (H) 04/05/2018   HDL 48 04/05/2018   LDLCALC 156 (H) 04/05/2018   TRIG 79 04/05/2018   CHOLHDL 4.6 04/05/2018   Lab Results  Component Value Date   HGBA1C 5.9 (H) 04/05/2018   Lab Results  Component Value Date   VITAMINB12 256 01/13/2017   Lab Results  Component Value Date   TSH 0.75 04/05/2018      ASSESSMENT AND PLAN 68 y.o. year old male  has a past medical history  of Degenerative joint disease, Dementia ([place]), Gout, Hemorrhoids, Hyperlipidemia, Hypertension, Impaired glucose tolerance, and Premature atrial contractions. here with:  1.  Memory disturbance  The patient was unable to complete MMSE today.  He will remain on Aricept.  He was on Namenda in the past but was unable to tolerate.  The patient has an appointment with a psychiatrist later this month.  For now I will make no adjustments to Risperdal until after this appointment.  I have provided the patient and his wife with resources  to help with his care.  I have advised that if his symptoms worsen or he develops new symptoms they should let us know.   I spent 15 minutes with the patient. 50% of this time was spent discussing his symptoms, plan of care and resources for help with his care.   Ward Givens, MSN, NP-C 06/20/2019, 10:40 AM [facility] [address] [place], [place] [address] [phone]

## 2019-06-26 ENCOUNTER — Encounter: Payer: Self-pay | Admitting: Family Medicine

## 2019-06-26 DIAGNOSIS — Z09 Encounter for follow-up examination after completed treatment for conditions other than malignant neoplasm: Secondary | ICD-10-CM | POA: Insufficient documentation

## 2019-06-26 DIAGNOSIS — F0390 Unspecified dementia without behavioral disturbance: Secondary | ICD-10-CM | POA: Insufficient documentation

## 2019-06-26 DIAGNOSIS — F03918 Unspecified dementia, unspecified severity, with other behavioral disturbance: Secondary | ICD-10-CM | POA: Insufficient documentation

## 2019-06-26 DIAGNOSIS — F0391 Unspecified dementia with behavioral disturbance: Secondary | ICD-10-CM | POA: Insufficient documentation

## 2019-06-26 NOTE — Assessment & Plan Note (Signed)
Patient in for follow up of recent hospitalization. Discharge summary, and laboratory and radiology data are reviewed, and any questions or concerns about recent hospitalization are discussed. Specific issues requiring follow up are specifically addressed.  

## 2019-06-26 NOTE — Assessment & Plan Note (Signed)
Significant deterioration in past year, pt best  Cared for in a protected environment, will do as I am able to facilitate long term care placemen

## 2019-07-03 ENCOUNTER — Other Ambulatory Visit: Payer: Self-pay | Admitting: Family Medicine

## 2019-07-07 ENCOUNTER — Telehealth: Payer: Self-pay | Admitting: *Deleted

## 2019-07-07 ENCOUNTER — Other Ambulatory Visit: Payer: Self-pay | Admitting: Family Medicine

## 2019-07-07 MED ORDER — MIRTAZAPINE 15 MG PO TABS: 15.0000 mg | ORAL_TABLET | Freq: Every day | ORAL | 0 refills | Status: DC

## 2019-07-07 MED ORDER — RISPERIDONE 1 MG PO TABS: 1.0000 mg | ORAL_TABLET | Freq: Every day | ORAL | 0 refills | Status: DC

## 2019-07-07 MED ORDER — RISPERIDONE 0.5 MG PO TABS: 0.5000 mg | ORAL_TABLET | Freq: Every day | ORAL | 0 refills | Status: DC

## 2019-07-07 MED ORDER — VALPROIC ACID 250 MG/5ML PO SOLN: ORAL | 0 refills | Status: DC

## 2019-07-07 NOTE — Telephone Encounter (Signed)
Medications requested sent for 30 day supply only, Psych to continue family member aware

## 2019-07-07 NOTE — Telephone Encounter (Signed)
pts wife called and said pt was on medication from behavioral health hospital and his first available appt with [facility] was 8-4. He is going to be out of his medications so she called back to the hospital he was d/c from and they told her to call his primary. She was wanting to know if she could get something to hold her over until they are able to get to [facility]. The medications are risperidone 0.5mg  risperidone 1 mg mirtazapine 15 mg trazodone 150 mg and depakene 250/5 mg liquid.

## 2019-07-08 ENCOUNTER — Telehealth: Payer: Self-pay | Admitting: *Deleted

## 2019-07-08 ENCOUNTER — Other Ambulatory Visit: Payer: Self-pay | Admitting: Family Medicine

## 2019-07-08 MED ORDER — TRAZODONE HCL 150 MG PO TABS: 150.0000 mg | ORAL_TABLET | Freq: Every day | ORAL | 0 refills | Status: DC

## 2019-07-08 NOTE — Telephone Encounter (Signed)
Pt wife called said she got all the medications Dr. Moshe Cipro called in for Marvin [name] but she did not get the trazodone 150 mg. Wanted to know if she was not going to fill that or if she had overlooked it. Would like a call back

## 2019-07-08 NOTE — Telephone Encounter (Signed)
Trazodone is sent today, oversight , I apologize, pls let her know

## 2019-07-08 NOTE — Telephone Encounter (Signed)
Advised spouse of medication being sent in with verbal understanding.

## 2019-07-20 ENCOUNTER — Telehealth: Payer: Self-pay | Admitting: *Deleted

## 2019-07-20 NOTE — Telephone Encounter (Signed)
pts wife Olin Hauser (on [place]) called said Mr. Spurgeon was referred to [facility] in [place]  However psychiatrist is no longer there and they gave her the number to a psychiatrist in [place]. She wanted to know if Dr. Moshe Cipro could recommend someone here or in [place] as she just couldn't drive to [place].

## 2019-07-20 NOTE — Telephone Encounter (Signed)
pls check [facility],call number available, [place]/ [place], explain situation and see if there is a Teacher, music available, please, if not will see if [facility] will see her

## 2019-07-20 NOTE — Telephone Encounter (Signed)
Wants to be referred to [facility] next door. I will enter, do I just use dx dementia with behavioral disturbance?

## 2019-07-21 ENCOUNTER — Other Ambulatory Visit: Payer: Self-pay | Admitting: Family Medicine

## 2019-07-21 DIAGNOSIS — F322 Major depressive disorder, single episode, severe without psychotic features: Secondary | ICD-10-CM

## 2019-07-21 DIAGNOSIS — F02818 Dementia in other diseases classified elsewhere, unspecified severity, with other behavioral disturbance: Secondary | ICD-10-CM

## 2019-07-21 DIAGNOSIS — F0281 Dementia in other diseases classified elsewhere with behavioral disturbance: Secondary | ICD-10-CM

## 2019-07-21 NOTE — Progress Notes (Signed)
amb psych  

## 2019-07-23 NOTE — Telephone Encounter (Signed)
I referred an sent  Message to Psych to see if they will be able to take him, will see response

## 2019-07-24 ENCOUNTER — Encounter (HOSPITAL_COMMUNITY): Payer: Self-pay | Admitting: Emergency Medicine

## 2019-07-24 ENCOUNTER — Other Ambulatory Visit: Payer: Self-pay

## 2019-07-24 ENCOUNTER — Observation Stay (HOSPITAL_COMMUNITY): Payer: Medicare HMO

## 2019-07-24 ENCOUNTER — Emergency Department (HOSPITAL_COMMUNITY): Payer: Medicare HMO

## 2019-07-24 ENCOUNTER — Inpatient Hospital Stay (HOSPITAL_COMMUNITY)
Admission: EM | Admit: 2019-07-24 | Discharge: 2019-07-28 | DRG: 640 | Disposition: A | Discharge status: Skilled Nursing Facility | Payer: Medicare HMO | Attending: Internal Medicine | Admitting: Internal Medicine

## 2019-07-24 DIAGNOSIS — I10 Essential (primary) hypertension: Secondary | ICD-10-CM | POA: Diagnosis not present

## 2019-07-24 DIAGNOSIS — Z9181 History of falling: Secondary | ICD-10-CM

## 2019-07-24 DIAGNOSIS — Z66 Do not resuscitate: Secondary | ICD-10-CM | POA: Diagnosis present

## 2019-07-24 DIAGNOSIS — Z886 Allergy status to analgesic agent status: Secondary | ICD-10-CM

## 2019-07-24 DIAGNOSIS — D696 Thrombocytopenia, unspecified: Secondary | ICD-10-CM

## 2019-07-24 DIAGNOSIS — E785 Hyperlipidemia, unspecified: Secondary | ICD-10-CM | POA: Diagnosis not present

## 2019-07-24 DIAGNOSIS — F5104 Psychophysiologic insomnia: Secondary | ICD-10-CM | POA: Diagnosis present

## 2019-07-24 DIAGNOSIS — R4182 Altered mental status, unspecified: Secondary | ICD-10-CM | POA: Diagnosis not present

## 2019-07-24 DIAGNOSIS — R918 Other nonspecific abnormal finding of lung field: Secondary | ICD-10-CM | POA: Diagnosis not present

## 2019-07-24 DIAGNOSIS — R609 Edema, unspecified: Secondary | ICD-10-CM | POA: Diagnosis present

## 2019-07-24 DIAGNOSIS — F0390 Unspecified dementia without behavioral disturbance: Secondary | ICD-10-CM | POA: Diagnosis present

## 2019-07-24 DIAGNOSIS — M1990 Unspecified osteoarthritis, unspecified site: Secondary | ICD-10-CM | POA: Diagnosis present

## 2019-07-24 DIAGNOSIS — E7849 Other hyperlipidemia: Secondary | ICD-10-CM

## 2019-07-24 DIAGNOSIS — E162 Hypoglycemia, unspecified: Principal | ICD-10-CM

## 2019-07-24 DIAGNOSIS — G319 Degenerative disease of nervous system, unspecified: Secondary | ICD-10-CM

## 2019-07-24 DIAGNOSIS — G300 Alzheimer's disease with early onset: Secondary | ICD-10-CM | POA: Diagnosis not present

## 2019-07-24 DIAGNOSIS — Z03818 Encounter for observation for suspected exposure to other biological agents ruled out: Secondary | ICD-10-CM | POA: Diagnosis not present

## 2019-07-24 DIAGNOSIS — I959 Hypotension, unspecified: Secondary | ICD-10-CM | POA: Diagnosis not present

## 2019-07-24 DIAGNOSIS — M109 Gout, unspecified: Secondary | ICD-10-CM | POA: Diagnosis present

## 2019-07-24 DIAGNOSIS — E161 Other hypoglycemia: Secondary | ICD-10-CM | POA: Diagnosis not present

## 2019-07-24 DIAGNOSIS — Z87891 Personal history of nicotine dependence: Secondary | ICD-10-CM

## 2019-07-24 DIAGNOSIS — Z79899 Other long term (current) drug therapy: Secondary | ICD-10-CM

## 2019-07-24 DIAGNOSIS — Z833 Family history of diabetes mellitus: Secondary | ICD-10-CM

## 2019-07-24 DIAGNOSIS — R410 Disorientation, unspecified: Secondary | ICD-10-CM | POA: Diagnosis not present

## 2019-07-24 DIAGNOSIS — Z20828 Contact with and (suspected) exposure to other viral communicable diseases: Secondary | ICD-10-CM | POA: Diagnosis present

## 2019-07-24 DIAGNOSIS — F0281 Dementia in other diseases classified elsewhere with behavioral disturbance: Secondary | ICD-10-CM | POA: Diagnosis present

## 2019-07-24 DIAGNOSIS — I872 Venous insufficiency (chronic) (peripheral): Secondary | ICD-10-CM | POA: Diagnosis present

## 2019-07-24 DIAGNOSIS — F0391 Unspecified dementia with behavioral disturbance: Secondary | ICD-10-CM

## 2019-07-24 DIAGNOSIS — G9341 Metabolic encephalopathy: Secondary | ICD-10-CM | POA: Diagnosis present

## 2019-07-24 DIAGNOSIS — R7302 Impaired glucose tolerance (oral): Secondary | ICD-10-CM | POA: Diagnosis present

## 2019-07-24 DIAGNOSIS — Z888 Allergy status to other drugs, medicaments and biological substances status: Secondary | ICD-10-CM

## 2019-07-24 DIAGNOSIS — R69 Illness, unspecified: Secondary | ICD-10-CM | POA: Diagnosis not present

## 2019-07-24 DIAGNOSIS — Z880 Allergy status to penicillin: Secondary | ICD-10-CM

## 2019-07-24 DIAGNOSIS — F03918 Unspecified dementia, unspecified severity, with other behavioral disturbance: Secondary | ICD-10-CM | POA: Diagnosis present

## 2019-07-24 DIAGNOSIS — Z820 Family history of epilepsy and other diseases of the nervous system: Secondary | ICD-10-CM

## 2019-07-24 HISTORY — DX: Thrombocytopenia, unspecified: D69.6

## 2019-07-24 LAB — RAPID URINE DRUG SCREEN, HOSP PERFORMED
Amphetamines: NOT DETECTED
Barbiturates: NOT DETECTED
Benzodiazepines: POSITIVE — AB
Cocaine: NOT DETECTED
Opiates: NOT DETECTED
Tetrahydrocannabinol: NOT DETECTED

## 2019-07-24 LAB — URINALYSIS, ROUTINE W REFLEX MICROSCOPIC
Bacteria, UA: NONE SEEN
Bilirubin Urine: NEGATIVE
Glucose, UA: NEGATIVE mg/dL
Ketones, ur: NEGATIVE mg/dL
Leukocytes,Ua: NEGATIVE
Nitrite: NEGATIVE
Protein, ur: NEGATIVE mg/dL
Specific Gravity, Urine: 1.008 (ref 1.005–1.030)
pH: 7 (ref 5.0–8.0)

## 2019-07-24 LAB — CBC WITH DIFFERENTIAL/PLATELET
Abs Immature Granulocytes: 0.01 10*3/uL (ref 0.00–0.07)
Basophils Absolute: 0 10*3/uL (ref 0.0–0.1)
Basophils Relative: 1 %
Eosinophils Absolute: 0.1 10*3/uL (ref 0.0–0.5)
Eosinophils Relative: 2 %
HCT: 41.3 % (ref 39.0–52.0)
Hemoglobin: 12.8 g/dL — ABNORMAL LOW (ref 13.0–17.0)
Immature Granulocytes: 0 %
Lymphocytes Relative: 27 %
Lymphs Abs: 1.1 10*3/uL (ref 0.7–4.0)
MCH: 28.3 pg (ref 26.0–34.0)
MCHC: 31 g/dL (ref 30.0–36.0)
MCV: 91.4 fL (ref 80.0–100.0)
Monocytes Absolute: 0.5 10*3/uL (ref 0.1–1.0)
Monocytes Relative: 11 %
Neutro Abs: 2.5 10*3/uL (ref 1.7–7.7)
Neutrophils Relative %: 59 %
Platelets: 119 10*3/uL — ABNORMAL LOW (ref 150–400)
RBC: 4.52 MIL/uL (ref 4.22–5.81)
RDW: 16.5 % — ABNORMAL HIGH (ref 11.5–15.5)
WBC: 4.1 10*3/uL (ref 4.0–10.5)
nRBC: 0 % (ref 0.0–0.2)

## 2019-07-24 LAB — COMPREHENSIVE METABOLIC PANEL
ALT: 14 U/L (ref 0–44)
AST: 15 U/L (ref 15–41)
Albumin: 3.1 g/dL — ABNORMAL LOW (ref 3.5–5.0)
Alkaline Phosphatase: 82 U/L (ref 38–126)
Anion gap: 9 (ref 5–15)
BUN: 15 mg/dL (ref 8–23)
CO2: 23 mmol/L (ref 22–32)
Calcium: 8.6 mg/dL — ABNORMAL LOW (ref 8.9–10.3)
Chloride: 108 mmol/L (ref 98–111)
Creatinine, Ser: 1.03 mg/dL (ref 0.61–1.24)
GFR calc Af Amer: >60 mL/min (ref >60)
GFR calc non Af Amer: >60 mL/min (ref >60)
Glucose, Bld: 119 mg/dL — ABNORMAL HIGH (ref 70–99)
Potassium: 3.9 mmol/L (ref 3.5–5.1)
Sodium: 140 mmol/L (ref 135–145)
Total Bilirubin: 0.7 mg/dL (ref 0.3–1.2)
Total Protein: 6.6 g/dL (ref 6.5–8.1)

## 2019-07-24 LAB — TSH: TSH: 0.857 u[IU]/mL (ref 0.350–4.500)

## 2019-07-24 LAB — GLUCOSE, CAPILLARY
Glucose-Capillary: 73 mg/dL (ref 70–99)
Glucose-Capillary: 88 mg/dL (ref 70–99)
Glucose-Capillary: 94 mg/dL (ref 70–99)

## 2019-07-24 LAB — VALPROIC ACID LEVEL: Valproic Acid Lvl: 62 ug/mL (ref 50.0–100.0)

## 2019-07-24 LAB — CBG MONITORING, ED
Glucose-Capillary: 106 mg/dL — ABNORMAL HIGH (ref 70–99)
Glucose-Capillary: 97 mg/dL (ref 70–99)
Glucose-Capillary: 79 mg/dL (ref 70–99)

## 2019-07-24 LAB — ACETAMINOPHEN LEVEL: Acetaminophen (Tylenol), Serum: <10 ug/mL — ABNORMAL LOW (ref 10–30)

## 2019-07-24 LAB — ETHANOL: Alcohol, Ethyl (B): <10 mg/dL (ref <10)

## 2019-07-24 LAB — SALICYLATE LEVEL: Salicylate Lvl: <7 mg/dL (ref 2.8–30.0)

## 2019-07-24 LAB — SARS CORONAVIRUS 2 BY RT PCR (HOSPITAL ORDER, PERFORMED IN ~~LOC~~ HOSPITAL LAB): SARS Coronavirus 2: NEGATIVE

## 2019-07-24 MED ORDER — DONEPEZIL HCL 5 MG PO TABS
10.0000 mg | ORAL_TABLET | Freq: Every day | ORAL | Status: DC
  Administered 2019-07-24 – 2019-07-27 (×4): 10 mg via ORAL
  Filled 2019-07-24 (×4): qty 2

## 2019-07-24 MED ORDER — VITAMIN B-12 1000 MCG PO TABS
1000.0000 ug | ORAL_TABLET | Freq: Every day | ORAL | Status: DC
  Administered 2019-07-25 – 2019-07-27 (×3): 1000 ug via ORAL
  Filled 2019-07-24 (×4): qty 1

## 2019-07-24 MED ORDER — RISPERIDONE 1 MG PO TABS
1.0000 mg | ORAL_TABLET | Freq: Every day | ORAL | Status: DC
  Administered 2019-07-24 – 2019-07-27 (×4): 1 mg via ORAL
  Filled 2019-07-24 (×4): qty 1

## 2019-07-24 MED ORDER — DEXTROSE-NACL 5-0.9 % IV SOLN
INTRAVENOUS | Status: DC
  Administered 2019-07-24 – 2019-07-25 (×2): via INTRAVENOUS

## 2019-07-24 MED ORDER — TRAZODONE HCL 50 MG PO TABS
150.0000 mg | ORAL_TABLET | Freq: Every day | ORAL | Status: DC
  Administered 2019-07-24 – 2019-07-27 (×4): 150 mg via ORAL
  Filled 2019-07-24 (×4): qty 3

## 2019-07-24 MED ORDER — POLYETHYLENE GLYCOL 3350 17 G PO PACK
17.0000 g | PACK | Freq: Every day | ORAL | Status: DC
  Administered 2019-07-25 – 2019-07-26 (×2): 17 g via ORAL
  Filled 2019-07-24 (×4): qty 1

## 2019-07-24 MED ORDER — ATORVASTATIN CALCIUM 40 MG PO TABS
40.0000 mg | ORAL_TABLET | Freq: Every day | ORAL | Status: DC
  Administered 2019-07-25 – 2019-07-27 (×3): 40 mg via ORAL
  Filled 2019-07-24 (×3): qty 1

## 2019-07-24 MED ORDER — ONDANSETRON HCL 4 MG PO TABS: 4.0000 mg | ORAL_TABLET | Freq: Four times a day (QID) | ORAL | Status: DC | PRN

## 2019-07-24 MED ORDER — CLONIDINE HCL 0.2 MG PO TABS
0.2000 mg | ORAL_TABLET | Freq: Two times a day (BID) | ORAL | Status: DC
  Administered 2019-07-24 – 2019-07-27 (×7): 0.2 mg via ORAL
  Filled 2019-07-24 (×8): qty 1

## 2019-07-24 MED ORDER — POTASSIUM CHLORIDE CRYS ER 20 MEQ PO TBCR
40.0000 meq | EXTENDED_RELEASE_TABLET | Freq: Every day | ORAL | Status: DC
  Administered 2019-07-24 – 2019-07-27 (×4): 40 meq via ORAL
  Filled 2019-07-24 (×5): qty 2

## 2019-07-24 MED ORDER — RISPERIDONE 0.5 MG PO TABS
0.5000 mg | ORAL_TABLET | Freq: Every day | ORAL | Status: DC
  Administered 2019-07-25 – 2019-07-27 (×3): 0.5 mg via ORAL
  Filled 2019-07-24 (×4): qty 1

## 2019-07-24 MED ORDER — FUROSEMIDE 40 MG PO TABS
40.0000 mg | ORAL_TABLET | Freq: Every day | ORAL | Status: DC
  Administered 2019-07-24 – 2019-07-27 (×4): 40 mg via ORAL
  Filled 2019-07-24 (×5): qty 1

## 2019-07-24 MED ORDER — HALOPERIDOL LACTATE 5 MG/ML IJ SOLN
5.0000 mg | Freq: Once | INTRAMUSCULAR | Status: AC
  Administered 2019-07-24: 21:00:00 5 mg via INTRAMUSCULAR
  Filled 2019-07-24: qty 1

## 2019-07-24 MED ORDER — VALPROIC ACID 250 MG/5ML PO SOLN
500.0000 mg | Freq: Two times a day (BID) | ORAL | Status: DC
  Administered 2019-07-25 – 2019-07-27 (×6): 500 mg via ORAL
  Filled 2019-07-24 (×10): qty 10

## 2019-07-24 MED ORDER — ACETAMINOPHEN 650 MG RE SUPP: 650.0000 mg | Freq: Four times a day (QID) | RECTAL | Status: DC | PRN

## 2019-07-24 MED ORDER — RISPERIDONE 0.5 MG PO TABS: 0.5000 mg | ORAL_TABLET | Freq: Every day | ORAL | Status: DC

## 2019-07-24 MED ORDER — LORAZEPAM 0.5 MG PO TABS
0.5000 mg | ORAL_TABLET | Freq: Once | ORAL | Status: AC
  Administered 2019-07-24: 14:00:00 0.5 mg via ORAL
  Filled 2019-07-24: qty 1

## 2019-07-24 MED ORDER — MIRTAZAPINE 15 MG PO TABS
15.0000 mg | ORAL_TABLET | Freq: Every day | ORAL | Status: DC
  Administered 2019-07-24 – 2019-07-27 (×4): 15 mg via ORAL
  Filled 2019-07-24 (×4): qty 1

## 2019-07-24 MED ORDER — ACETAMINOPHEN 325 MG PO TABS: 650.0000 mg | ORAL_TABLET | Freq: Four times a day (QID) | ORAL | Status: DC | PRN

## 2019-07-24 MED ORDER — LORAZEPAM 1 MG PO TABS
1.0000 mg | ORAL_TABLET | ORAL | Status: DC | PRN
  Administered 2019-07-27: 1 mg via ORAL
  Filled 2019-07-24: qty 1

## 2019-07-24 MED ORDER — ENOXAPARIN SODIUM 60 MG/0.6ML ~~LOC~~ SOLN
50.0000 mg | SUBCUTANEOUS | Status: DC
  Administered 2019-07-24 – 2019-07-25 (×2): 50 mg via SUBCUTANEOUS
  Filled 2019-07-24: qty 0.6

## 2019-07-24 MED ORDER — ONDANSETRON HCL 4 MG/2ML IJ SOLN: 4.0000 mg | Freq: Four times a day (QID) | INTRAMUSCULAR | Status: DC | PRN

## 2019-07-24 MED ORDER — HALOPERIDOL LACTATE 5 MG/ML IJ SOLN
1.0000 mg | Freq: Four times a day (QID) | INTRAMUSCULAR | Status: DC | PRN
  Administered 2019-07-27: 23:00:00 1 mg via INTRAVENOUS
  Filled 2019-07-24: qty 1

## 2019-07-24 MED ORDER — ALLOPURINOL 300 MG PO TABS
300.0000 mg | ORAL_TABLET | Freq: Every day | ORAL | Status: DC
  Administered 2019-07-24 – 2019-07-27 (×4): 300 mg via ORAL
  Filled 2019-07-24 (×4): qty 1

## 2019-07-24 MED ORDER — VITAMIN D 25 MCG (1000 UNIT) PO TABS
1000.0000 [IU] | ORAL_TABLET | Freq: Every day | ORAL | Status: DC
  Administered 2019-07-25 – 2019-07-27 (×3): 1000 [IU] via ORAL
  Filled 2019-07-24 (×4): qty 1

## 2019-07-24 MED ORDER — LORAZEPAM 2 MG/ML IJ SOLN
1.0000 mg | Freq: Once | INTRAMUSCULAR | Status: AC | PRN
  Administered 2019-07-24: 22:00:00 1 mg via INTRAVENOUS
  Filled 2019-07-24: qty 1

## 2019-07-24 NOTE — ED Notes (Signed)
Pt returned from CT °

## 2019-07-24 NOTE — ED Notes (Addendum)
Pt has removed his telemetry leads multiple times, pt confused due to dementia and unable to redirect.  CN made aware safety sitter needed for pt.  Supervisor Wilhelmenia Blase, RN made aware of Air cabin crew needed. No sitters are available at this time.  Warned pt is a fall risk. EDPA also made aware of pt.  Ativan ordered and given.

## 2019-07-24 NOTE — H&P (Addendum)
History and Physical  [name] FIE:[id number] DOB: 1951-05-30 DOA: 07/24/2019  Referring physician: Thurnell Garbe DO PCP: Fayrene Helper, MD   Chief Complaint: altered mental status  HPI: Marvin [name] is a 68 y.o. male with a long past medical history of severe progressive dementia with behavioral disturbances who had been recently hospitalized with [facility] and subsequently discharged home after some medication changes, history of hyperlipidemia, hypertension, prediabetes, gout who was sent to ED by EMS after wife noticed that the patient has been altered in mentation and it has been progressively worsening over the past 3 days.  The patient has been eating and drinking.  When EMS arrived and evaluated the patient they found him to be lethargic and was not responsive.  His blood glucose on arrival was 28.  He was treated with D10 and his mentation improved.  The patient has a long history of taking inappropriate medications likely related to his severe progressive dementia and there is insulin in the house but it is apparently locked in a bedroom according to the patient's wife.  Unfortunately she is sleeping during the day because she works nights and is not able to monitor him as closely during the day because she is sleeping.  She does not know of any specific incidence of the patient taking insulin or any diabetes medications.  However, she is not sure.  The patient was seen in the emergency department and a CT of the brain revealed cerebral atrophy but no acute findings.  The patient's blood sugar improved after treatments.  Poison control was contacted and they did review his home medications but did not feel that any of those medications could have contributed to his severe hypoglycemia.  The patient has been eating and drinking.  Patient's urinalysis and drug screen is pending at this time.  SARS 2 COVID-19 testing is pending. CXR and EKG pending.  Given the patient  had a severe hypoglycemic event observation was requested for further monitoring for recurrence of hypoglycemia.  Review of Systems: Unable to obtain due to severe advanced dementia  Past Medical History:  Diagnosis Date   Degenerative joint disease    Dementia ([place])    Gout    Hemorrhoids    Dr. Romona Curls   Hyperlipidemia    Hypertension    Impaired glucose tolerance    Premature atrial contractions    Past Surgical History:  Procedure Laterality Date   COLONOSCOPY  07/2004   COLONOSCOPY N/A 09/22/2014   Procedure: COLONOSCOPY;  Surgeon: Daneil Dolin, MD;  Location[facility];  Service: Endoscopy;  Laterality: N/A;  9:30 AM        PT REQUEST TIME   KNEE ARTHROSCOPY     Right   Social History:  reports that he quit smoking about 28 years ago. He has never used smokeless tobacco. He reports that he does not drink alcohol or use drugs.  Allergies  Allergen Reactions   Aspirin Other (See Comments)    Stomach upset   Namenda [Memantine Hcl] Other (See Comments)    Increased dementia behavior issues   Naprosyn [Naproxen] Hives   Penicillins     Did it involve swelling of the face/tongue/throat, SOB, or low BP? Unknown Did it involve sudden or severe rash/hives, skin peeling, or any reaction on the inside of your mouth or nose? Unknown Did you need to seek medical attention at a hospital or doctor's office? Unknown When did it last happen?Unknown If all above  answers are NO, may proceed with cephalosporin use.     Family History  Problem Relation Age of Onset   Alzheimer's disease Mother    Diabetes Father    Heart failure Father        prostate disease    Alzheimer's disease Sister    Kidney failure Brother    Diabetes Brother    High blood pressure Brother    Cancer Brother     Prior to Admission medications   Medication Sig Start Date End Date Taking? Authorizing Provider  allopurinol (ZYLOPRIM) 300 MG tablet Take 300 mg by mouth  at bedtime.   Yes [provider]  amLODipine (NORVASC) 10 MG tablet Take 10 mg by mouth daily.   Yes [provider]  atorvastatin (LIPITOR) 40 MG tablet Take 1 tablet by mouth once daily 07/05/19  Yes Fayrene Helper, MD  cholecalciferol (VITAMIN D3) 25 MCG (1000 UT) tablet Take 1,000 Units by mouth daily.   Yes [provider]  cloNIDine (CATAPRES) 0.2 MG tablet TAKE 1 TABLET BY MOUTH THREE TIMES DAILY Patient taking differently: Take 0.2-0.4 mg by mouth See admin instructions. 0.2mg  in the morning and 0.4mg  at bedtime 03/18/19  Yes Fayrene Helper, MD  donepezil (ARICEPT) 10 MG tablet Take 1 tablet (10 mg total) by mouth at bedtime. 04/27/18  Yes Ward Givens, NP  LORazepam (ATIVAN) 1 MG tablet Take 0.5 mg by mouth daily.   Yes [provider]  mirtazapine (REMERON) 15 MG tablet Take 1 tablet (15 mg total) by mouth at bedtime. 07/07/19  Yes Fayrene Helper, MD  polyethylene glycol (MIRALAX / GLYCOLAX) 17 g packet Take 17 g by mouth daily.   Yes [provider]  risperiDONE (RISPERDAL) 0.5 MG tablet Take 1 tablet (0.5 mg total) by mouth at bedtime. 07/07/19  Yes Fayrene Helper, MD  risperiDONE (RISPERDAL) 1 MG tablet Take 1 tablet (1 mg total) by mouth at bedtime. 07/07/19  Yes Fayrene Helper, MD  traZODone (DESYREL) 150 MG tablet Take 1 tablet (150 mg total) by mouth at bedtime. 07/08/19  Yes Fayrene Helper, MD  valproic acid (DEPAKENE) 250 MG/5ML SOLN solution Take 10 cc by mouth every 12 hours 07/07/19  Yes Fayrene Helper, MD  vitamin B-12 (CYANOCOBALAMIN) 1000 MCG tablet Take 1,000 mcg by mouth daily.   Yes [provider]   Physical Exam: Vitals:   07/24/19 1300 07/24/19 1315 07/24/19 1431 07/24/19 1434  BP:   (!) 152/86   Pulse: 64 71  62  Resp: 18 (!) 22 15 12   Temp:      TempSrc:      SpO2: 98% 100%  100%  Weight:      Height:         General exam: Moderately built and nourished patient, lying  comfortably supine on the gurney in no obvious distress. He has severe dementia and oriented to self only.  He does not answer questions and mostly nonverbal.   Head, eyes and ENT: Nontraumatic and normocephalic. Pupils equally reacting to light and accommodation. Oral mucosa dry.  Neck: Supple. No JVD, carotid bruit or thyromegaly.  Lymphatics: No lymphadenopathy.  Respiratory system: Clear to auscultation. No increased work of breathing.  Cardiovascular system: S1 and S2 heard, RRR. 2+ pitting edema bilateral  Gastrointestinal system: Abdomen is nondistended, soft and nontender. Normal bowel sounds heard. No organomegaly or masses appreciated.  Central nervous system: Lethargic, disoriented with severe dementia.  No focal neurological deficits.  Extremities: Symmetric 5 x 5 power. Peripheral pulses symmetrically felt.   Skin: No rashes or acute findings.  Musculoskeletal system: 2+ pitting edema BLEs.  Psychiatry: Flat affect.  Labs on Admission:  Basic Metabolic Panel: Recent Labs  Lab 07/24/19 1255  NA 140  K 3.9  CL 108  CO2 23  GLUCOSE 119*  BUN 15  CREATININE 1.03  CALCIUM 8.6*   Liver Function Tests: Recent Labs  Lab 07/24/19 1255  AST 15  ALT 14  ALKPHOS 82  BILITOT 0.7  PROT 6.6  ALBUMIN 3.1*   No results for input(s): LIPASE, AMYLASE in the last 168 hours. No results for input(s): AMMONIA in the last 168 hours. CBC: Recent Labs  Lab 07/24/19 1255  WBC 4.1  NEUTROABS 2.5  HGB 12.8*  HCT 41.3  MCV 91.4  PLT 119*   Cardiac Enzymes: No results for input(s): CKTOTAL, CKMB, CKMBINDEX, TROPONINI in the last 168 hours.  BNP (last 3 results) No results for input(s): PROBNP in the last 8760 hours. CBG: Recent Labs  Lab 07/24/19 1204 07/24/19 1256  GLUCAP 106* 97    Radiological Exams on Admission: Ct Head Wo Contrast  Result Date: 07/24/2019 CLINICAL DATA:  68 year old male with history of altered mental status for the past 3 days. EXAM:  CT HEAD WITHOUT CONTRAST TECHNIQUE: Contiguous axial images were obtained from the base of the skull through the vertex without intravenous contrast. COMPARISON:  No priors. FINDINGS: Brain: Mild cerebral atrophy. Patchy and confluent areas of decreased attenuation are noted throughout the deep and periventricular white matter of the cerebral hemispheres bilaterally, compatible with chronic microvascular ischemic disease. No evidence of acute infarction, hemorrhage, hydrocephalus, extra-axial collection or mass lesion/mass effect. Vascular: No hyperdense vessel or unexpected calcification. Skull: Normal. Negative for fracture or focal lesion. Sinuses/Orbits: No acute finding. Other: None. IMPRESSION: 1. No acute intracranial abnormalities. 2. Mild cerebral atrophy with chronic microvascular ischemic changes in the cerebral white matter, as above. Electronically Signed   By: Vinnie Langton M.D.   On: 07/24/2019 14:26   Assessment/Plan Principal Problem:   Hypoglycemia Active Problems:   Hypertension   Hyperlipidemia   Dementia (HCC)   Dementia with behavioral problem (HCC)   Cerebral atrophy (HCC)   Thrombocytopenia ([place])  1. Severe hypoglycemia -with a blood glucose of 28, I am highly suspicious that this is the result of inappropriate insulin.  I have ordered a C-peptide level insulin level is insulin/proinsulin ratio ordered.  Check A1c.  Check CBG every 4 hours.  Regular diet ordered.  Dextrose infusion ordered at a low rate.  Check vitamin D level and B12.  Check TSH.  Urinalysis and urine drug screen ordered.  Neurochecks ordered every 4 hours.  Consult to dietitian to assess nutritional needs.  Try to obtain MRI brain in AM if behavior will allow.  If combative will likely abandon MRI study.  2. Severe dementia with behavioral problems-patient was recently discharged from [facility] in [place] and will continue his psychiatric medications as ordered from home.  IV  Haldol ordered and lorazepam ordered as needed for severe behavioral symptoms.  Safety sitter will be ordered if necessary. 3. Thrombocytopenia- Lovenox ordered as platelet count is greater than 100 but will need to follow. 4. Hyperlipidemia-resume home statin therapy. 5. Bilateral LE edema - Pt has 2+ pitting edema BLE, likely venous insufficiency.  DC amlodipine.  Check 2D Echocardiogram.  Place TED hoses and elevate extremities as much as possible.  6. Chronic insomnia-resumed home  medications. 7. Essential hypertension- resume home blood pressure medications and follow blood pressure.  DVT Prophylaxis: Lovenox Code Status: DNR Family Communication: Wife Disposition Plan: Observation, IV fluids, reassess in a.m.  Time spent: 56 minutes  Irwin Brakeman, MD [facility] How to contact the [facility] Attending or Consulting provider [place] or covering provider during after hours [place], for this patient?  1. Check the care team in [facility] and look for a) attending/consulting [facility] provider listed and b) the [facility] team listed 2. Log into [email or web address] and use [facility]'s universal password to access. If you do not have the password, please contact the hospital operator. 3. Locate the [facility] provider you are looking for under [facility] and page to a number that you can be directly reached. 4. If you still have difficulty reaching the provider, please page the [facility] (Director on Call) for the Hospitalists listed on amion for assistance.

## 2019-07-24 NOTE — ED Triage Notes (Signed)
Pt brought in from home by ems after his wife called for altered mental status. Wife states he has been slightly altered x 3 days. Found to have a glucose of 28 and was given 514ml of D10. Pt is not diabetic and wife reports good po intake. Was recently in [place] for dementia.

## 2019-07-24 NOTE — ED Notes (Signed)
Family member in room stated that she had to leave.

## 2019-07-24 NOTE — ED Notes (Signed)
Pt incont of urine, pants and underwear removed, pt cleaned and dried. Depends placed on pt. Clothes bagged and gave to family member.

## 2019-07-24 NOTE — ED Notes (Signed)
Pt is very confused and trying to get out of bed. Is difficult to redirect verbally. Pt assisted back to bed by myself and Dr. Wynetta Emery. Pt has no IV access and is upset about things on him like bandages, bp cuff, etc. Dr. Wynetta Emery ok with no IV access at this time as pt is difficult stick and is likely to remove it again.

## 2019-07-24 NOTE — Progress Notes (Addendum)
Received from ED at 1800. Would not allow placement of tele or IV (see RN ED note). CBG 73 on admit. Pt accepted 250 cc of Ensure. CBG 15 minutes later 88 and then independently got  OOB with four side rails up and alarm on - went to BR then out of room. Pt needed extreme coaching to go back in room after pt walked around unit with NT x 1; gait steady.  Cooperative at times. LOC drowsy with periods of alertness. Is not oriented x 4.  Low bed in place with floor mats and sitter. Pt is very quick with movement and very strong. Continue to monitor.

## 2019-07-24 NOTE — ED Provider Notes (Signed)
[facility] EMERGENCY DEPARTMENT Provider Note   CSN: [id number] Arrival date & time: 07/24/19  1158     History   Chief Complaint Chief Complaint  Patient presents with  . Hypoglycemia    HPI Marvin [name] is a 68 y.o. male.     HPI  Patient is a 68 yo male with a PMH of severe dementia with behavioral disturbance, HLD, HTN, prediabetes not on any medication, gout presenting for hypoglycemia. Patient presents with his wife who provides entire history. Patient's wife works third shift and has patient on video monitoring overnight. She reports the video indicates that the patient did not move all night. She reports the patient ate 2 pre-packaged meals on wheels meals at 9pm before she went to work. She found that he appears excessively sleepy this morning and was not as responsive. He tried to get up from the chair and collapsed to the floor. EMS arrived to scene and glucose was 28, and pt was given 500 mL D10 with full response, now back to baseline. She does not believe he had head trauma. Pt does not take thinners. She reports that her mother takes insulin (Lantus only) and no other antihyperglycemics. She keeps the insulin in a locked bedroom and verified that it was still locked this AM. She reports the the patient does occasionally try to ingest items such as laundry detergent, alcohol and she has to keep these things out of reach. She reports pt has no access to toxic alcohols.   Past Medical History:  Diagnosis Date  . Degenerative joint disease   . Dementia ([place])   . Gout   . Hemorrhoids    Dr. Romona Curls  . Hyperlipidemia   . Hypertension   . Impaired glucose tolerance   . Premature atrial contractions     Patient Active Problem List   Diagnosis Date Noted  . Hospital discharge follow-up 06/26/2019  . Dementia ([place]) 06/26/2019  . Dementia with behavioral problem ([place]) 06/26/2019  . Prediabetes 04/13/2018  . Hyperuricemia 04/13/2018  . Memory loss 01/12/2017  .  Metabolic syndrome X 81/27/5170  . Obesity (BMI 30.0-34.9) 06/26/2013  . Hypertension   . Hyperlipidemia   . Degenerative joint disease   . Premature atrial contractions   . IGT (impaired glucose tolerance) 02/07/2010    Past Surgical History:  Procedure Laterality Date  . COLONOSCOPY  07/2004  . COLONOSCOPY N/A 09/22/2014   Procedure: COLONOSCOPY;  Surgeon: Daneil Dolin, MD;  Location: [facility];  Service: Endoscopy;  Laterality: N/A;  9:30 AM        PT REQUEST TIME  . KNEE ARTHROSCOPY     Right        Home Medications    Prior to Admission medications   Medication Sig Start Date End Date Taking? Authorizing Provider  allopurinol (ZYLOPRIM) 300 MG tablet Take 300 mg by mouth at bedtime.    [provider]  amLODipine (NORVASC) 10 MG tablet Take 10 mg by mouth daily.    [provider]  atorvastatin (LIPITOR) 40 MG tablet Take 1 tablet by mouth once daily 07/05/19   Fayrene Helper, MD  cholecalciferol (VITAMIN D3) 25 MCG (1000 UT) tablet Take 1,000 Units by mouth daily.    [provider]  cloNIDine (CATAPRES) 0.2 MG tablet TAKE 1 TABLET BY MOUTH THREE TIMES DAILY Patient taking differently: Take 0.2-0.4 mg by mouth See admin instructions. 0.2mg  in the morning and 0.4mg  at bedtime 03/18/19   Fayrene Helper,  MD  donepezil (ARICEPT) 10 MG tablet Take 1 tablet (10 mg total) by mouth at bedtime. 04/27/18   Ward Givens, NP  LORazepam (ATIVAN) 1 MG tablet Take 0.5 mg by mouth daily.    [provider]  mirtazapine (REMERON) 15 MG tablet Take 1 tablet (15 mg total) by mouth at bedtime. 07/07/19   Fayrene Helper, MD  polyethylene glycol (MIRALAX / GLYCOLAX) 17 g packet Take 17 g by mouth daily.    [provider]  psyllium (REGULOID) 0.52 g capsule Take 0.52 g by mouth daily.    [provider]  risperiDONE (RISPERDAL) 0.5 MG tablet Take 0.5 mg by mouth at bedtime.    [provider]  risperiDONE (RISPERDAL)  0.5 MG tablet Take 1 tablet (0.5 mg total) by mouth at bedtime. 07/07/19   Fayrene Helper, MD  risperiDONE (RISPERDAL) 1 MG tablet Take 1 mg by mouth at bedtime.    [provider]  risperiDONE (RISPERDAL) 1 MG tablet Take 1 tablet (1 mg total) by mouth at bedtime. 07/07/19   Fayrene Helper, MD  traZODone (DESYREL) 150 MG tablet Take by mouth at bedtime.    [provider]  traZODone (DESYREL) 150 MG tablet Take 1 tablet (150 mg total) by mouth at bedtime. 07/08/19   Fayrene Helper, MD  Valproate Sodium (DEPAKENE) 250 MG/5ML SOLN solution Take 500 mg by mouth every 12 (twelve) hours.    [provider]  valproic acid (DEPAKENE) 250 MG/5ML SOLN solution Take 10 cc by mouth every 12 hours 07/07/19   Fayrene Helper, MD  vitamin B-12 (CYANOCOBALAMIN) 1000 MCG tablet Take 1,000 mcg by mouth daily.    [provider]    Family History Family History  Problem Relation Age of Onset  . Alzheimer's disease Mother   . Diabetes Father   . Heart failure Father        prostate disease   . Alzheimer's disease Sister   . Kidney failure Brother   . Diabetes Brother   . High blood pressure Brother   . Cancer Brother     Social History Social History   Tobacco Use  . Smoking status: Former Smoker    Quit date: 1992    Years since quitting: 28.6  . Smokeless tobacco: Never Used  Substance Use Topics  . Alcohol use: No    Comment: Quit 1992  . Drug use: No     Allergies   Aspirin, Namenda [memantine hcl], Naprosyn [naproxen], and Penicillins   Review of Systems Review of Systems  Constitutional: Negative for activity change, chills and fever.  Gastrointestinal: Negative for abdominal pain, nausea and vomiting.  Neurological: Negative for weakness.  Psychiatric/Behavioral: Negative for behavioral problems.   Unable to perform full ROS due to dementia.  Physical Exam Updated Vital Signs BP 109/87   Pulse 60   Temp 97.6 F (36.4 C)  (Oral)   Resp 18   Ht 6' (1.829 m)   Wt 102 kg   SpO2 98%   BMI 30.50 kg/m   Physical Exam Vitals signs and nursing note reviewed.  Constitutional:      General: He is not in acute distress.    Appearance: He is well-developed. He is not ill-appearing or diaphoretic.     Comments: Alert to voice and name. Does not respond to questions. Redirectable by wife in room.  HENT:     Head: Normocephalic and atraumatic.     Mouth/Throat:  Mouth: Mucous membranes are moist.  Eyes:     Conjunctiva/sclera: Conjunctivae normal.     Pupils: Pupils are equal, round, and reactive to light.  Neck:     Musculoskeletal: Normal range of motion and neck supple.  Cardiovascular:     Rate and Rhythm: Normal rate and regular rhythm.     Heart sounds: S1 normal and S2 normal. No murmur.  Pulmonary:     Effort: Pulmonary effort is normal.     Breath sounds: Normal breath sounds. No wheezing or rales.  Abdominal:     General: There is no distension.     Palpations: Abdomen is soft.     Tenderness: There is no abdominal tenderness. There is no guarding.  Musculoskeletal: Normal range of motion.        General: No deformity.  Lymphadenopathy:     Cervical: No cervical adenopathy.  Skin:    General: Skin is warm and dry.     Findings: No erythema or rash.  Neurological:     Mental Status: He is alert.     Comments: Cranial nerves grossly intact. Patient moves extremities symmetrically and with good coordination. Moves all extremities equally but unable to follow directions for full neuro exam.   Psychiatric:        Behavior: Behavior normal.        Thought Content: Thought content normal.        Judgment: Judgment normal.      ED Treatments / Results  Labs (all labs ordered are listed, but only abnormal results are displayed) Labs Reviewed  COMPREHENSIVE METABOLIC PANEL - Abnormal; Notable for the following components:      Result Value   Glucose, Bld 119 (*)    Calcium 8.6 (*)     Albumin 3.1 (*)    All other components within normal limits  ACETAMINOPHEN LEVEL - Abnormal; Notable for the following components:   Acetaminophen (Tylenol), Serum <10 (*)    All other components within normal limits  CBC WITH DIFFERENTIAL/PLATELET - Abnormal; Notable for the following components:   Hemoglobin 12.8 (*)    RDW 16.5 (*)    Platelets 119 (*)    All other components within normal limits  CBG MONITORING, ED - Abnormal; Notable for the following components:   Glucose-Capillary 106 (*)    All other components within normal limits  SARS CORONAVIRUS 2 (HOSPITAL ORDER, [facility])  SALICYLATE LEVEL  VALPROIC ACID LEVEL  ETHANOL  URINALYSIS, ROUTINE W REFLEX MICROSCOPIC  CBG MONITORING, ED  CBG MONITORING, ED  CBG MONITORING, ED    EKG EKG Interpretation  Date/Time:  Sunday July 24 2019 12:08:45 EDT Ventricular Rate:  70 PR Interval:    QRS Duration: 98 QT Interval:  417 QTC Calculation: 450 R Axis:   30 Text Interpretation:  Poor data quality Normal sinus rhythm Anteroseptal infarct, old Baseline wander Artifact When compared with ECG of 05/19/2019 Artifact is now Present no apparent acute changes Confirmed by Francine Graven [id number]) on 07/24/2019 1:31:31 PM   Radiology Ct Head Wo Contrast  Result Date: 07/24/2019 CLINICAL DATA:  68 year old male with history of altered mental status for the past 3 days. EXAM: CT HEAD WITHOUT CONTRAST TECHNIQUE: Contiguous axial images were obtained from the base of the skull through the vertex without intravenous contrast. COMPARISON:  No priors. FINDINGS: Brain: Mild cerebral atrophy. Patchy and confluent areas of decreased attenuation are noted throughout the deep and periventricular white matter of the cerebral hemispheres  bilaterally, compatible with chronic microvascular ischemic disease. No evidence of acute infarction, hemorrhage, hydrocephalus, extra-axial collection or mass lesion/mass effect.  Vascular: No hyperdense vessel or unexpected calcification. Skull: Normal. Negative for fracture or focal lesion. Sinuses/Orbits: No acute finding. Other: None. IMPRESSION: 1. No acute intracranial abnormalities. 2. Mild cerebral atrophy with chronic microvascular ischemic changes in the cerebral white matter, as above. Electronically Signed   By: Vinnie Langton M.D.   On: 07/24/2019 14:26    Procedures Procedures (including critical care time)  Medications Ordered in ED Medications - No data to display   Initial Impression / Assessment and Plan / ED Course  I have reviewed the triage vital signs and the nursing notes.  Pertinent labs & imaging results that were available during my care of the patient were reviewed by me and considered in my medical decision making (see chart for details).  Clinical Course as of Jul 23 1438  Sun Jul 24, 2019  1408 Improving.   Glucose(!): 119 [AM]    Clinical Course User Index [AM] Albesa Seen, PA-C       This is a well appearing 68 yo male with a PMH of severe dementia with behavioral disturbance, HLD, HTN, prediabetes not on any medication, gout presenting for hypoglycemia of 28. No history of diabetes or metabolic cause of hypoglycemia. According to family, insulin for pt's mother in law is kept in a locked room.   Case reviewed with [facility] poison control who did not find a reason for hypoglycemia on med list. Recommended assessment for co-ingestants, regular assessment of glucose, and feed pt. ASA overdose can be another cause of hypoglycemia.   CT head without acute abnormality. Slight hypocalcemia but otherwise unremarkable electrolytes. EKG in NSR without evidence of acute ischemia, infarction, or arrhythmia.   After eating, pt has maintained his glucose. Glucose of 28 is profound and do have concerns for iatrogenic cause, such as pt finding lantus pen in garbage. Would recommend observing for further hypoglycemia.   Patient admitted per  Dr. Wynetta Emery. Appreciate his involvement.   This is a supervised visit with Dr. Francine Graven. Evaluation, management, and disposition planning discussed with this attending physician.  Final Clinical Impressions(s) / ED Diagnoses   Final diagnoses:  Hypoglycemia    ED Discharge Orders    None       Albesa Seen, PA-C 07/24/19 [place], [place], DO 07/25/19 0900

## 2019-07-25 ENCOUNTER — Telehealth: Payer: Self-pay | Admitting: Family Medicine

## 2019-07-25 ENCOUNTER — Observation Stay (HOSPITAL_BASED_OUTPATIENT_CLINIC_OR_DEPARTMENT_OTHER): Payer: Medicare HMO

## 2019-07-25 DIAGNOSIS — M109 Gout, unspecified: Secondary | ICD-10-CM | POA: Diagnosis not present

## 2019-07-25 DIAGNOSIS — Z66 Do not resuscitate: Secondary | ICD-10-CM | POA: Diagnosis not present

## 2019-07-25 DIAGNOSIS — G300 Alzheimer's disease with early onset: Secondary | ICD-10-CM

## 2019-07-25 DIAGNOSIS — Z880 Allergy status to penicillin: Secondary | ICD-10-CM | POA: Diagnosis not present

## 2019-07-25 DIAGNOSIS — R000 Tachycardia, unspecified: Secondary | ICD-10-CM | POA: Diagnosis not present

## 2019-07-25 DIAGNOSIS — E162 Hypoglycemia, unspecified: Secondary | ICD-10-CM | POA: Diagnosis not present

## 2019-07-25 DIAGNOSIS — Z833 Family history of diabetes mellitus: Secondary | ICD-10-CM | POA: Diagnosis not present

## 2019-07-25 DIAGNOSIS — E7849 Other hyperlipidemia: Secondary | ICD-10-CM | POA: Diagnosis not present

## 2019-07-25 DIAGNOSIS — E785 Hyperlipidemia, unspecified: Secondary | ICD-10-CM

## 2019-07-25 DIAGNOSIS — Z87891 Personal history of nicotine dependence: Secondary | ICD-10-CM | POA: Diagnosis not present

## 2019-07-25 DIAGNOSIS — Z888 Allergy status to other drugs, medicaments and biological substances status: Secondary | ICD-10-CM | POA: Diagnosis not present

## 2019-07-25 DIAGNOSIS — Z03818 Encounter for observation for suspected exposure to other biological agents ruled out: Secondary | ICD-10-CM | POA: Diagnosis not present

## 2019-07-25 DIAGNOSIS — Z820 Family history of epilepsy and other diseases of the nervous system: Secondary | ICD-10-CM | POA: Diagnosis not present

## 2019-07-25 DIAGNOSIS — F5104 Psychophysiologic insomnia: Secondary | ICD-10-CM | POA: Diagnosis present

## 2019-07-25 DIAGNOSIS — F0281 Dementia in other diseases classified elsewhere with behavioral disturbance: Secondary | ICD-10-CM | POA: Diagnosis present

## 2019-07-25 DIAGNOSIS — Z79899 Other long term (current) drug therapy: Secondary | ICD-10-CM | POA: Diagnosis not present

## 2019-07-25 DIAGNOSIS — G9341 Metabolic encephalopathy: Secondary | ICD-10-CM | POA: Diagnosis not present

## 2019-07-25 DIAGNOSIS — Z743 Need for continuous supervision: Secondary | ICD-10-CM | POA: Diagnosis not present

## 2019-07-25 DIAGNOSIS — D696 Thrombocytopenia, unspecified: Secondary | ICD-10-CM | POA: Diagnosis not present

## 2019-07-25 DIAGNOSIS — I872 Venous insufficiency (chronic) (peripheral): Secondary | ICD-10-CM | POA: Diagnosis not present

## 2019-07-25 DIAGNOSIS — Z886 Allergy status to analgesic agent status: Secondary | ICD-10-CM | POA: Diagnosis not present

## 2019-07-25 DIAGNOSIS — R451 Restlessness and agitation: Secondary | ICD-10-CM | POA: Diagnosis present

## 2019-07-25 DIAGNOSIS — Z9181 History of falling: Secondary | ICD-10-CM | POA: Diagnosis not present

## 2019-07-25 DIAGNOSIS — I10 Essential (primary) hypertension: Secondary | ICD-10-CM | POA: Diagnosis not present

## 2019-07-25 DIAGNOSIS — F0280 Dementia in other diseases classified elsewhere without behavioral disturbance: Secondary | ICD-10-CM

## 2019-07-25 DIAGNOSIS — I5021 Acute systolic (congestive) heart failure: Secondary | ICD-10-CM | POA: Diagnosis not present

## 2019-07-25 DIAGNOSIS — R609 Edema, unspecified: Secondary | ICD-10-CM | POA: Diagnosis not present

## 2019-07-25 DIAGNOSIS — R404 Transient alteration of awareness: Secondary | ICD-10-CM | POA: Diagnosis not present

## 2019-07-25 DIAGNOSIS — F0391 Unspecified dementia with behavioral disturbance: Secondary | ICD-10-CM | POA: Diagnosis not present

## 2019-07-25 DIAGNOSIS — R4182 Altered mental status, unspecified: Secondary | ICD-10-CM | POA: Diagnosis not present

## 2019-07-25 DIAGNOSIS — E161 Other hypoglycemia: Secondary | ICD-10-CM | POA: Diagnosis not present

## 2019-07-25 DIAGNOSIS — Z20828 Contact with and (suspected) exposure to other viral communicable diseases: Secondary | ICD-10-CM | POA: Diagnosis not present

## 2019-07-25 DIAGNOSIS — M1990 Unspecified osteoarthritis, unspecified site: Secondary | ICD-10-CM | POA: Diagnosis present

## 2019-07-25 DIAGNOSIS — R7302 Impaired glucose tolerance (oral): Secondary | ICD-10-CM | POA: Diagnosis not present

## 2019-07-25 DIAGNOSIS — G319 Degenerative disease of nervous system, unspecified: Secondary | ICD-10-CM | POA: Diagnosis not present

## 2019-07-25 DIAGNOSIS — R69 Illness, unspecified: Secondary | ICD-10-CM | POA: Diagnosis not present

## 2019-07-25 LAB — ECHOCARDIOGRAM COMPLETE
Height: 72 in
Weight: 3597.91 oz

## 2019-07-25 LAB — GLUCOSE, CAPILLARY
Glucose-Capillary: 102 mg/dL — ABNORMAL HIGH (ref 70–99)
Glucose-Capillary: 161 mg/dL — ABNORMAL HIGH (ref 70–99)
Glucose-Capillary: 84 mg/dL (ref 70–99)
Glucose-Capillary: 84 mg/dL (ref 70–99)
Glucose-Capillary: 96 mg/dL (ref 70–99)
Glucose-Capillary: 98 mg/dL (ref 70–99)

## 2019-07-25 LAB — MAGNESIUM: Magnesium: 1.9 mg/dL (ref 1.7–2.4)

## 2019-07-25 LAB — VITAMIN B12: Vitamin B-12: 477 pg/mL (ref 180–914)

## 2019-07-25 NOTE — Progress Notes (Signed)
Initial Nutrition Assessment  DOCUMENTATION CODES:   Not applicable  INTERVENTION:  -Ensure Enlive po BID, each supplement provides 350 kcal and 20 grams of protein Magic cup TID with meals, each supplement provides 290 kcal and 9 grams of protein -MVI   NUTRITION DIAGNOSIS:   Predicted suboptimal nutrient intake related to lethargy/confusion(severe progressive dementia) as evidenced by other (comment)(pt with poor po x 3 days PTA per family report, blood glucose 58 at EMS arrival to home).   GOAL:   Patient will meet greater than or equal to 90% of their needs   MONITOR:   PO intake, I & O's, Labs, Weight trends  REASON FOR ASSESSMENT:   Consult Assessment of nutrition requirement/status  ASSESSMENT:  68 year old male with medical history significant of severe progressive dementia with behavioral disturbances, HLD, HTN, Gout, recent hospitalization with [facility]. Patient presented to ED by EMS after wife noticed patient with progressively worsening AMS and poor intake over the past 3 days. EMS found patient lethargic and non responsive, blood glucose was 28 and treated with D10 prior to ED arrival  Per chart review, patient climbing out of bed today, pushing past staff, pt unable to be redirected. Order for Haldol received and given; pt continued to be non-compliant, unable to redirect. Orders for restraints received. Patient resting comfortably in bed this morning. RD will defer nutrition assessment at this time.  Current wt: 102 kg (224.4 lb) +3; BLE; taken 8/9 Stable wt history per chart review    I/O: -3065 ml since admit UOP: 3350 ml x 24 hrs    Medications reviewed and include: vitD3, Aricept, Lasix, Remeron, Miralax, KCL 40 mEq tablet, Risperdal, Depakene, trazodone, vit B12,  D5 @ 50 ml/hr  Labs: CBGS 84-102  NUTRITION - FOCUSED PHYSICAL EXAM: Unable to complete at this time   Diet Order:   Diet Order            Diet regular Room service appropriate?  Yes; Fluid consistency: Thin  Diet effective now              EDUCATION NEEDS:   No education needs have been identified at this time  Skin:  Skin Assessment: Reviewed RN Assessment  Last BM:  8/7  Height:   Ht Readings from Last 1 Encounters:  07/24/19 6' (1.829 m)    Weight:   Wt Readings from Last 1 Encounters:  07/24/19 102 kg    Ideal Body Weight:  80.9 kg  BMI:  Body mass index is 30.5 kg/m.  Estimated Nutritional Needs:   Kcal:  8413-2440  Protein:  118-128 grams  Fluid:  >2.3L  Lajuan Lines, RD, LDN  After Hours/Weekend Pager: [phone]

## 2019-07-25 NOTE — Telephone Encounter (Signed)
pls let wife know I am aware of current hospitalization  ALSO , unable to be treated at [facility] , I reached out to Psych directly, needs higher level of care   Now that he is hospitalized , she should request social worker  Consult re placement due to his severe dementia

## 2019-07-25 NOTE — Progress Notes (Signed)
*  PRELIMINARY RESULTS* Echocardiogram 2D Echocardiogram has been performed.  Leavy Cella 07/25/2019, 11:32 AM

## 2019-07-25 NOTE — Progress Notes (Signed)
PROGRESS NOTE    Marvin [name]  ZSW:[id number]  DOB: 03/25/1951  DOA: 07/24/2019 PCP: Fayrene Helper, MD   Brief Admission Hx: 68 year old gentleman with advanced dementia sent from home with altered mental status and found to have severe hypoglycemia with a blood glucose of 28.  MDM/Assessment & Plan:   1. Severe hypoglycemia-patient was found to have a blood glucose of 28 at admission.  I suspect that he somehow was given insulin.  We were unable to obtain labs to get a C-peptide as the lab has been unable to get blood draws from him.  He is being treated with low dextrose infusion and his blood sugars have been stable and he has been eating. 2. Severe dementia with behavioral problems- he has been resumed on his home psychiatric medications.  He was recently hospitalized at [facility] behavioral [facility] in [place] and we have resumed his psychiatric medications from that admission. 3. Thrombocytopenia-lab has been unable to get morning blood draw but will attempt again after hydration 4. Hyperlipidemia-resume home statin therapy 5. Bilateral lower extremity edema-suspect venous stasis, order for TED hose and given Lasix, DC amlodipine, follow 2D echo. 6. Chronic insomnia-resumed home medications and following. 7. Essential hypertension-resume home medications and follow.  DVT prophylaxis: Lovenox Code Status: DNR Family Communication: Wife Disposition Plan: Inpatient   Consultants:  Social work case management  Procedures:    Antimicrobials:     Subjective: Patient is sleeping as he had been medicated.  Objective: Vitals:   07/24/19 1810 07/24/19 2022 07/24/19 2038 07/25/19 0407  BP: (!) 171/83   (!) 166/83  Pulse: (!) 59 82  71  Resp:  18  19  Temp: 98.7 F (37.1 C) 98.2 F (36.8 C)  98.2 F (36.8 C)  TempSrc: Oral Oral  Oral  SpO2: 100% 100% 100% 98%  Weight:      Height:        Intake/Output Summary (Last 24 hours) at 07/25/2019 1153 Last data  filed at 07/25/2019 1100 Gross per 24 hour  Intake 284.67 ml  Output 4000 ml  Net -3715.33 ml   Filed Weights   07/24/19 1204  Weight: 102 kg     REVIEW OF SYSTEMS  As per history otherwise all reviewed and reported negative  Exam:  General exam: Elderly male with dementia lying in the bed with safety restraints in place. Respiratory system: Clear. No increased work of breathing. Cardiovascular system: S1 & S2 heard. No JVD, murmurs, gallops, clicks or pedal edema. Gastrointestinal system: Abdomen is nondistended, soft and nontender. Normal bowel sounds heard. Central nervous system: Alert and oriented. No focal neurological deficits. Extremities: Improving edema bilateral lower extremities.  Data Reviewed: Basic Metabolic Panel: Recent Labs  Lab 07/24/19 1255  NA 140  K 3.9  CL 108  CO2 23  GLUCOSE 119*  BUN 15  CREATININE 1.03  CALCIUM 8.6*  MG 1.9   Liver Function Tests: Recent Labs  Lab 07/24/19 1255  AST 15  ALT 14  ALKPHOS 82  BILITOT 0.7  PROT 6.6  ALBUMIN 3.1*   No results for input(s): LIPASE, AMYLASE in the last 168 hours. No results for input(s): AMMONIA in the last 168 hours. CBC: Recent Labs  Lab 07/24/19 1255  WBC 4.1  NEUTROABS 2.5  HGB 12.8*  HCT 41.3  MCV 91.4  PLT 119*   Cardiac Enzymes: No results for input(s): CKTOTAL, CKMB, CKMBINDEX, TROPONINI in the last 168 hours. CBG (last 3)  Recent Labs    07/25/19  0019 07/25/19 0359 07/25/19 0814  GLUCAP 84 98 102*   Recent Results (from the past 240 hour(s))  SARS Coronavirus 2 [facility] order, Performed in [place] hospital lab)     Status: None   Collection Time: 07/24/19  2:40 PM  Result Value Ref Range Status   SARS Coronavirus 2 NEGATIVE NEGATIVE Final    Comment: (NOTE) If result is NEGATIVE SARS-CoV-2 target nucleic acids are NOT DETECTED. The SARS-CoV-2 RNA is generally detectable in upper and lower  respiratory specimens during the acute phase of infection. The  lowest  concentration of SARS-CoV-2 viral copies this assay can detect is 250  copies / mL. A negative result does not preclude SARS-CoV-2 infection  and should not be used as the sole basis for treatment or other  patient management decisions.  A negative result may occur with  improper specimen collection / handling, submission of specimen other  than nasopharyngeal swab, presence of viral mutation(s) within the  areas targeted by this assay, and inadequate number of viral copies  (<250 copies / mL). A negative result must be combined with clinical  observations, patient history, and epidemiological information. If result is POSITIVE SARS-CoV-2 target nucleic acids are DETECTED. The SARS-CoV-2 RNA is generally detectable in upper and lower  respiratory specimens dur ing the acute phase of infection.  Positive  results are indicative of active infection with SARS-CoV-2.  Clinical  correlation with patient history and other diagnostic information is  necessary to determine patient infection status.  Positive results do  not rule out bacterial infection or co-infection with other viruses. If result is PRESUMPTIVE POSTIVE SARS-CoV-2 nucleic acids MAY BE PRESENT.   A presumptive positive result was obtained on the submitted specimen  and confirmed on repeat testing.  While 2019 novel coronavirus  (SARS-CoV-2) nucleic acids may be present in the submitted sample  additional confirmatory testing may be necessary for epidemiological  and / or clinical management purposes  to differentiate between  SARS-CoV-2 and other Sarbecovirus currently known to infect humans.  If clinically indicated additional testing with an alternate test  methodology [phone]) is advised. The SARS-CoV-2 RNA is generally  detectable in upper and lower respiratory sp ecimens during the acute  phase of infection. The expected result is Negative. Fact Sheet for Patients:   StrictlyIdeas.no Fact Sheet for Healthcare Providers: BankingDealers.co.za This test is not yet approved or cleared by the [place] FDA and has been authorized for detection and/or diagnosis of SARS-CoV-2 by FDA under an Emergency Use Authorization (EUA).  This EUA will remain in effect (meaning this test can be used) for the duration of the COVID-19 declaration under Section 564(b)(1) of the Act, 21 U.S.C. section 360bbb-3(b)(1), unless the authorization is terminated or revoked sooner. Performed at [facility], [address], [place], [place] [address]      Studies: Ct Head Wo Contrast  Result Date: 07/24/2019 CLINICAL DATA:  68 year old male with history of altered mental status for the past 3 days. EXAM: CT HEAD WITHOUT CONTRAST TECHNIQUE: Contiguous axial images were obtained from the base of the skull through the vertex without intravenous contrast. COMPARISON:  No priors. FINDINGS: Brain: Mild cerebral atrophy. Patchy and confluent areas of decreased attenuation are noted throughout the deep and periventricular white matter of the cerebral hemispheres bilaterally, compatible with chronic microvascular ischemic disease. No evidence of acute infarction, hemorrhage, hydrocephalus, extra-axial collection or mass lesion/mass effect. Vascular: No hyperdense vessel or unexpected calcification. Skull: Normal. Negative for fracture or focal lesion. Sinuses/Orbits:  No acute finding. Other: None. IMPRESSION: 1. No acute intracranial abnormalities. 2. Mild cerebral atrophy with chronic microvascular ischemic changes in the cerebral white matter, as above. Electronically Signed   By: Vinnie Langton M.D.   On: 07/24/2019 14:26   Dg Chest Port 1 View  Result Date: 07/24/2019 CLINICAL DATA:  Hypoglycemia. EXAM: PORTABLE CHEST 1 VIEW COMPARISON:  May 18, 2019 FINDINGS: Bibasilar opacities are platelike and favored represent atelectasis. Low lung volumes  are noted. No pneumothorax. The cardiomediastinal silhouette is normal. No other abnormalities. IMPRESSION: Bibasilar opacities favored represent atelectasis. No other abnormalities. Electronically Signed   By: Dorise Bullion III M.D   On: 07/24/2019 15:59     Scheduled Meds:  allopurinol  300 mg Oral QHS   atorvastatin  40 mg Oral q1800   cholecalciferol  1,000 Units Oral Daily   cloNIDine  0.2 mg Oral BID   donepezil  10 mg Oral QHS   enoxaparin (LOVENOX) injection  50 mg Subcutaneous Q24H   furosemide  40 mg Oral Daily   mirtazapine  15 mg Oral QHS   polyethylene glycol  17 g Oral Daily   potassium chloride  40 mEq Oral Daily   risperiDONE  0.5 mg Oral Daily   risperiDONE  1 mg Oral QHS   traZODone  150 mg Oral QHS   valproic acid  500 mg Oral BID   vitamin B-12  1,000 mcg Oral Daily   Continuous Infusions:  dextrose 5 % and 0.9% NaCl 50 mL/hr at 07/25/19 0400    Principal Problem:   Hypoglycemia Active Problems:   Hypertension   Hyperlipidemia   Dementia (HCC)   Dementia with behavioral problem (HCC)   Cerebral atrophy (HCC)   Thrombocytopenia (HCC)   Venous insufficiency of both lower extremities   2+ pitting edema BLE   Time spent:   Irwin Brakeman, MD [facility] 07/25/2019, 11:53 AM    LOS: 0 days  How to contact the [facility] Attending or Consulting provider [place] or covering provider during after hours [place], for this patient?  1. Check the care team in [facility] and look for a) attending/consulting TRH provider listed and b) the [facility] team listed 2. Log into [email or web address] and use [place]'s universal password to access. If you do not have the password, please contact the hospital operator. 3. Locate the [facility] provider you are looking for under [facility] and page to a number that you can be directly reached. 4. If you still have difficulty reaching the provider, please page the [facility] (Director on Call) for the Hospitalists listed on  amion for assistance.

## 2019-07-25 NOTE — Progress Notes (Signed)
Patient non-compliant, climbing out of bed, pushing past staff, unable to redirect. MD notified via text page. Order for Haldol received and given. Patient still non-compliant, unable to redirect. MD paged again. New order for restraints received and carried out. Patient currently resting and comfortable in bed. Will continue to monitor.

## 2019-07-25 NOTE — TOC Initial Note (Signed)
Transition of Care [facility]) - Initial/Assessment Note    Patient Details  Name: Marvin [name] MRN: [id number] Date of Birth: Jan 15, 1951  [facility]) CM/SW Contact:    Boneta Lucks, RN Phone Number: 07/25/2019, 2:44 PM  Clinical Narrative:          Patient admitted under observation for hypoglycemia. Wife told ED staff, he needs placement, she can not longer care for him at home. Spoke with Tula Nakayama at [facility] [phone].  Wife has submitted everything 3 times to Medicaid, Pending placement.  Submitted PASSR received #   [id number] . CM left message for wife, she works nights, to make a plan to send out for placement.    Expected Discharge Plan: Memory Care Barriers to Discharge: Inadequate or no insurance   Patient Goals and CMS Choice Patient states their goals for this hospitalization and ongoing recovery are:: to go to long term CMS Medicare.gov Compare Post Acute Care list provided to:: Patient Represenative (must comment) Choice offered to / list presented to : Spouse  Expected Discharge Plan and Services Expected Discharge Plan: Memory Care   Discharge Planning Services: CM Consult   Living arrangements for the past 2 months: Single Family Home Expected Discharge Date: (unknown)                   Prior Living Arrangements/Services Living arrangements for the past 2 months: Single Family Home Lives with:: Spouse   Do you feel safe going back to the place where you live?: No               Activities of Daily Living Home Assistive Devices/Equipment: CBG Meter, Other (Comment)(Unknown) ADL Screening (condition at time of admission) Patient's cognitive ability adequate to safely complete daily activities?: No Is the patient deaf or have difficulty hearing?: Yes Does the patient have difficulty seeing, even when wearing glasses/contacts?: No Does the patient have difficulty concentrating, remembering, or making decisions?: Yes Patient able  to express need for assistance with ADLs?: Yes(Refused to cooperate) Does the patient have difficulty dressing or bathing?: Yes Independently performs ADLs?: No Communication: Needs assistance Is this a change from baseline?: Pre-admission baseline Dressing (OT): Needs assistance Is this a change from baseline?: Pre-admission baseline Grooming: Needs assistance Is this a change from baseline?: Pre-admission baseline Feeding: Needs assistance Is this a change from baseline?: Pre-admission baseline Bathing: Needs assistance Is this a change from baseline?: Pre-admission baseline Toileting: Needs assistance Is this a change from baseline?: Pre-admission baseline In/Out Bed: Needs assistance Is this a change from baseline?: Pre-admission baseline Walks in Home: Independent Is this a change from baseline?: Pre-admission baseline Does the patient have difficulty walking or climbing stairs?: No Weakness of Legs: None Weakness of Arms/Hands: None   Admission diagnosis:  Hypoglycemia [E16.2] Patient Active Problem List   Diagnosis Date Noted  . Hypoglycemia 07/24/2019  . Cerebral atrophy ([place]) 07/24/2019  . Thrombocytopenia ([place]) 07/24/2019  . Venous insufficiency of both lower extremities 07/24/2019  . 2+ pitting edema BLE 07/24/2019  . Hospital discharge follow-up 06/26/2019  . Dementia ([place]) 06/26/2019  . Dementia with behavioral problem ([place]) 06/26/2019  . Prediabetes 04/13/2018  . Hyperuricemia 04/13/2018  . Memory loss 01/12/2017  . Metabolic syndrome X 85/46/2703  . Obesity (BMI 30.0-34.9) 06/26/2013  . Hypertension   . Hyperlipidemia   . Degenerative joint disease   . Premature atrial contractions   . IGT (impaired glucose tolerance) 02/07/2010   PCP:  Fayrene Helper, MD Pharmacy:  [facility] [address], [place] [address] [address] [place] [address] Phone: [phone] Fax: [place] [address]  [place] [address] [place] [address] Phone: [phone] Fax: [phone]        Readmission Risk Interventions No flowsheet data found.

## 2019-07-26 LAB — GLUCOSE, CAPILLARY
Glucose-Capillary: 100 mg/dL — ABNORMAL HIGH (ref 70–99)
Glucose-Capillary: 114 mg/dL — ABNORMAL HIGH (ref 70–99)
Glucose-Capillary: 118 mg/dL — ABNORMAL HIGH (ref 70–99)
Glucose-Capillary: 68 mg/dL — ABNORMAL LOW (ref 70–99)
Glucose-Capillary: 81 mg/dL (ref 70–99)
Glucose-Capillary: 85 mg/dL (ref 70–99)
Glucose-Capillary: 89 mg/dL (ref 70–99)

## 2019-07-26 NOTE — NC FL2 (Signed)
[place] LEVEL OF CARE SCREENING TOOL     IDENTIFICATION  Patient Name: Marvin [name] Birthdate: 11-06-1951 Sex: male Admission Date (Current Location): 07/24/2019  [facility] and [place] Number:  [employer] and Address:  [facility] [address] [place]      Provider Number: [id number]  Attending Physician Name and Address:  Murlean Iba, MD  Relative Name and Phone Number:  Olin Hauser - wife [phone]    Current Level of Care: Hospital Recommended Level of Care: Memory Care Prior Approval Number:    Date Approved/Denied:   PASRR Number: [id number]  Discharge Plan: Domiciliary (Rest home)    Current Diagnoses: Patient Active Problem List   Diagnosis Date Noted  . Hypoglycemia 07/24/2019  . Cerebral atrophy ([place]) 07/24/2019  . Thrombocytopenia ([place]) 07/24/2019  . Venous insufficiency of both lower extremities 07/24/2019  . 2+ pitting edema BLE 07/24/2019  . Hospital discharge follow-up 06/26/2019  . Dementia ([place]) 06/26/2019  . Dementia with behavioral problem ([place]) 06/26/2019  . Prediabetes 04/13/2018  . Hyperuricemia 04/13/2018  . Memory loss 01/12/2017  . Metabolic syndrome X 32/35/5732  . Obesity (BMI 30.0-34.9) 06/26/2013  . Hypertension   . Hyperlipidemia   . Degenerative joint disease   . Premature atrial contractions   . IGT (impaired glucose tolerance) 02/07/2010    Orientation RESPIRATION BLADDER Height & Weight        Normal Continent Weight: 102 kg Height:  6' (182.9 cm)  BEHAVIORAL SYMPTOMS/MOOD NEUROLOGICAL BOWEL NUTRITION STATUS  Wanderer, Other (Comment)(slow baby steps, night wanderer)   Continent Diet(Regular)  AMBULATORY STATUS COMMUNICATION OF NEEDS Skin   Extensive Assist Verbally Normal                       Personal Care Assistance Level of Assistance  Total care           Functional Limitations Info  Sight, Hearing, Speech Sight Info: Adequate Hearing Info:  Adequate Speech Info: Adequate    SPECIAL CARE FACTORS FREQUENCY                       Contractures Contractures Info: Not present    Additional Factors Info  Code Status, Allergies, Psychotropic Code Status Info: DNR Allergies Info: Aspirin, namenda, naprosyn, penicillins Psychotropic Info: Aricept, Risperdal, Trazodone         Current Medications (07/26/2019):  This is the current hospital active medication list Current Facility-Administered Medications  Medication Dose Route Frequency Provider Last Rate Last Dose  . acetaminophen (TYLENOL) tablet 650 mg  650 mg Oral Q6H PRN Johnson, Clanford L, MD       Or  . acetaminophen (TYLENOL) suppository 650 mg  650 mg Rectal Q6H PRN Johnson, Clanford L, MD      . allopurinol (ZYLOPRIM) tablet 300 mg  300 mg Oral QHS Johnson, Clanford L, MD   300 mg at 07/25/19 2305  . atorvastatin (LIPITOR) tablet 40 mg  40 mg Oral q1800 Johnson, Clanford L, MD   40 mg at 07/25/19 1817  . cholecalciferol (VITAMIN D3) tablet 1,000 Units  1,000 Units Oral Daily Irwin Brakeman L, MD   1,000 Units at 07/26/19 [phone]  . cloNIDine (CATAPRES) tablet 0.2 mg  0.2 mg Oral BID Wynetta Emery, Clanford L, MD   0.2 mg at 07/26/19 0938  . dextrose 5 %-0.9 % sodium chloride infusion   Intravenous Continuous Johnson, Clanford L, MD 10 mL/hr at 07/25/19 1821    .  donepezil (ARICEPT) tablet 10 mg  10 mg Oral QHS Johnson, Clanford L, MD   10 mg at 07/25/19 2305  . furosemide (LASIX) tablet 40 mg  40 mg Oral Daily Johnson, Clanford L, MD   40 mg at 07/26/19 2992  . haloperidol lactate (HALDOL) injection 1 mg  1 mg Intravenous Q6H PRN Johnson, Clanford L, MD      . LORazepam (ATIVAN) tablet 1 mg  1 mg Oral Q4H PRN Johnson, Clanford L, MD      . mirtazapine (REMERON) tablet 15 mg  15 mg Oral QHS Johnson, Clanford L, MD   15 mg at 07/25/19 2305  . ondansetron (ZOFRAN) tablet 4 mg  4 mg Oral Q6H PRN Johnson, Clanford L, MD       Or  . ondansetron (ZOFRAN) injection 4 mg  4 mg  Intravenous Q6H PRN Johnson, Clanford L, MD      . polyethylene glycol (MIRALAX / GLYCOLAX) packet 17 g  17 g Oral Daily Johnson, Clanford L, MD   17 g at 07/26/19 0937  . potassium chloride SA (K-DUR) CR tablet 40 mEq  40 mEq Oral Daily Johnson, Clanford L, MD   40 mEq at 07/26/19 0937  . risperiDONE (RISPERDAL) tablet 0.5 mg  0.5 mg Oral Daily Johnson, Clanford L, MD   0.5 mg at 07/26/19 0939  . risperiDONE (RISPERDAL) tablet 1 mg  1 mg Oral QHS Johnson, Clanford L, MD   1 mg at 07/25/19 2305  . traZODone (DESYREL) tablet 150 mg  150 mg Oral QHS Johnson, Clanford L, MD   150 mg at 07/25/19 2305  . valproic acid (DEPAKENE) solution 500 mg  500 mg Oral BID Wynetta Emery, Clanford L, MD   500 mg at 07/26/19 0939  . vitamin B-12 (CYANOCOBALAMIN) tablet 1,000 mcg  1,000 mcg Oral Daily Wynetta Emery, Clanford L, MD   1,000 mcg at 07/26/19 4268     Discharge Medications: Please see discharge summary for a list of discharge medications.  Relevant Imaging Results:  Relevant Lab Results:   Additional Information SS # [phone] [phone]  Boneta Lucks, RN

## 2019-07-26 NOTE — Telephone Encounter (Signed)
Called and left voice mail

## 2019-07-26 NOTE — TOC Progression Note (Signed)
Transition of Care [facility]) - Progression Note    Patient Details  Name: THAD OSORIA MRN: [id number] Date of Birth: [birth year]  Transition of Care [facility]) CM/SW Contact  Boneta Lucks, RN Phone Number: 07/26/2019, 2:31 PM  Clinical Narrative:   SPoke with Olin Hauser- wife, she has been very proactive in getting help for and placement for her husband. She is tearful and states she does not want to do this, but she works 6 to [employer] a week and has not care for him.  State he walks with slow baby steps, and wanders at night. Wife has applied for Medicaid, and last month had FL2 done by PCP and called [facility] in [place].  They did not have beds at that time. CM called [place] DSS to confirm that Medicaid is pending placement.  TOC to follow.      Expected Discharge Plan: Memory Care Barriers to Discharge: Inadequate or no insurance  Expected Discharge Plan and Services Expected Discharge Plan: Memory Care   Discharge Planning Services: CM Consult   Living arrangements for the past 2 months: Single Family Home Expected Discharge Date: (unknown)                           Readmission Risk Interventions No flowsheet data found.

## 2019-07-26 NOTE — Progress Notes (Signed)
Restraints discontinued at 1600 today.

## 2019-07-26 NOTE — Progress Notes (Signed)
PROGRESS NOTE    DAIVEON MARKMAN  ELF:[id number]  DOB: 1951-08-21  DOA: 07/24/2019 PCP: Fayrene Helper, MD  Brief Admission Hx: 68 year old gentleman with advanced dementia sent from home with altered mental status and found to have severe hypoglycemia with a blood glucose of 28.  MDM/Assessment & Plan:   1. Severe hypoglycemia-patient was found to have a blood glucose of 28 at admission.  I suspect that he somehow was given insulin injection.  We were unable to obtain labs to get a C-peptide, insulin levels, as the lab has been unable to get blood draws from him.  He was treated with low dextrose infusion and his blood sugars have been stable and he has been eating. 2. Severe dementia with behavioral problems- he has been resumed on his home psychiatric medications.  He was recently hospitalized at [facility] behavioral health in [place] and we have resumed his psychiatric medications from that admission. 3. Thrombocytopenia-lab has been unable to get morning blood draw but will attempt again after hydration.  4. Hyperlipidemia-resume home statin therapy 5. Bilateral lower extremity edema-suspect venous stasis, order for TED hose and given Lasix, DC amlodipine, follow 2D echo. 6. Chronic insomnia-resumed home medications and following. 7. Essential hypertension-resume home medications and follow.  DVT prophylaxis: Lovenox Code Status: DNR Family Communication: Wife Disposition Plan: Inpatient  Consultants:  Social work case management  Procedures:    Antimicrobials:     Subjective: Patient is sleeping as he had been medicated.  Objective: Vitals:   07/25/19 0407 07/25/19 2013 07/25/19 2126 07/26/19 0619  BP: (!) 166/83  (!) 176/91 (!) 165/85  Pulse: 71  81 70  Resp: 19  17   Temp: 98.2 F (36.8 C)  99.1 F (37.3 C)   TempSrc: Oral  Oral   SpO2: 98% 96% 100% 99%  Weight:      Height:        Intake/Output Summary (Last 24 hours) at 07/26/2019 1333 Last data  filed at 07/26/2019 1300 Gross per 24 hour  Intake 720 ml  Output 1750 ml  Net -1030 ml   Filed Weights   07/24/19 1204  Weight: 102 kg   REVIEW OF SYSTEMS  As per history otherwise all reviewed and reported negative  Exam:  General exam: Elderly male with dementia lying in the bed with safety restraints in place. Respiratory system: Clear. No increased work of breathing. Cardiovascular system: S1 & S2 heard. No JVD, murmurs, gallops, clicks or pedal edema. Gastrointestinal system: Abdomen is nondistended, soft and nontender. Normal bowel sounds heard. Central nervous system: Alert and oriented. No focal neurological deficits. Extremities: Improving edema bilateral lower extremities.  Data Reviewed: Basic Metabolic Panel: Recent Labs  Lab 07/24/19 1255  NA 140  K 3.9  CL 108  CO2 23  GLUCOSE 119*  BUN 15  CREATININE 1.03  CALCIUM 8.6*  MG 1.9   Liver Function Tests: Recent Labs  Lab 07/24/19 1255  AST 15  ALT 14  ALKPHOS 82  BILITOT 0.7  PROT 6.6  ALBUMIN 3.1*   No results for input(s): LIPASE, AMYLASE in the last 168 hours. No results for input(s): AMMONIA in the last 168 hours. CBC: Recent Labs  Lab 07/24/19 1255  WBC 4.1  NEUTROABS 2.5  HGB 12.8*  HCT 41.3  MCV 91.4  PLT 119*   Cardiac Enzymes: No results for input(s): CKTOTAL, CKMB, CKMBINDEX, TROPONINI in the last 168 hours. CBG (last 3)  Recent Labs    07/26/19 0402 07/26/19 0738 07/26/19  [place]   Recent Results (from the past 240 hour(s))  SARS Coronavirus 2 [facility] order, Performed in [facility] hospital lab)     Status: None   Collection Time: 07/24/19  2:40 PM  Result Value Ref Range Status   SARS Coronavirus 2 NEGATIVE NEGATIVE Final    Comment: (NOTE) If result is NEGATIVE SARS-CoV-2 target nucleic acids are NOT DETECTED. The SARS-CoV-2 RNA is generally detectable in upper and lower  respiratory specimens during the acute phase of infection. The lowest    concentration of SARS-CoV-2 viral copies this assay can detect is 250  copies / mL. A negative result does not preclude SARS-CoV-2 infection  and should not be used as the sole basis for treatment or other  patient management decisions.  A negative result may occur with  improper specimen collection / handling, submission of specimen other  than nasopharyngeal swab, presence of viral mutation(s) within the  areas targeted by this assay, and inadequate number of viral copies  (<250 copies / mL). A negative result must be combined with clinical  observations, patient history, and epidemiological information. If result is POSITIVE SARS-CoV-2 target nucleic acids are DETECTED. The SARS-CoV-2 RNA is generally detectable in upper and lower  respiratory specimens dur ing the acute phase of infection.  Positive  results are indicative of active infection with SARS-CoV-2.  Clinical  correlation with patient history and other diagnostic information is  necessary to determine patient infection status.  Positive results do  not rule out bacterial infection or co-infection with other viruses. If result is PRESUMPTIVE POSTIVE SARS-CoV-2 nucleic acids MAY BE PRESENT.   A presumptive positive result was obtained on the submitted specimen  and confirmed on repeat testing.  While 2019 novel coronavirus  (SARS-CoV-2) nucleic acids may be present in the submitted sample  additional confirmatory testing may be necessary for epidemiological  and / or clinical management purposes  to differentiate between  SARS-CoV-2 and other Sarbecovirus currently known to infect humans.  If clinically indicated additional testing with an alternate test  methodology [phone]) is advised. The SARS-CoV-2 RNA is generally  detectable in upper and lower respiratory sp ecimens during the acute  phase of infection. The expected result is Negative. Fact Sheet for Patients:  StrictlyIdeas.no Fact Sheet  for Healthcare Providers: BankingDealers.co.za This test is not yet approved or cleared by the [place] FDA and has been authorized for detection and/or diagnosis of SARS-CoV-2 by FDA under an Emergency Use Authorization (EUA).  This EUA will remain in effect (meaning this test can be used) for the duration of the COVID-19 declaration under Section 564(b)(1) of the Act, 21 U.S.C. section 360bbb-3(b)(1), unless the authorization is terminated or revoked sooner. Performed at [facility], [address], [place], [place] [address]     Studies: Ct Head Wo Contrast  Result Date: 07/24/2019 CLINICAL DATA:  68 year old male with history of altered mental status for the past 3 days. EXAM: CT HEAD WITHOUT CONTRAST TECHNIQUE: Contiguous axial images were obtained from the base of the skull through the vertex without intravenous contrast. COMPARISON:  No priors. FINDINGS: Brain: Mild cerebral atrophy. Patchy and confluent areas of decreased attenuation are noted throughout the deep and periventricular white matter of the cerebral hemispheres bilaterally, compatible with chronic microvascular ischemic disease. No evidence of acute infarction, hemorrhage, hydrocephalus, extra-axial collection or mass lesion/mass effect. Vascular: No hyperdense vessel or unexpected calcification. Skull: Normal. Negative for fracture or focal lesion. Sinuses/Orbits: No acute finding. Other:  None. IMPRESSION: 1. No acute intracranial abnormalities. 2. Mild cerebral atrophy with chronic microvascular ischemic changes in the cerebral white matter, as above. Electronically Signed   By: Vinnie Langton M.D.   On: 07/24/2019 14:26   Dg Chest Port 1 View  Result Date: 07/24/2019 CLINICAL DATA:  Hypoglycemia. EXAM: PORTABLE CHEST 1 VIEW COMPARISON:  May 18, 2019 FINDINGS: Bibasilar opacities are platelike and favored represent atelectasis. Low lung volumes are noted. No pneumothorax. The cardiomediastinal  silhouette is normal. No other abnormalities. IMPRESSION: Bibasilar opacities favored represent atelectasis. No other abnormalities. Electronically Signed   By: Dorise Bullion III M.D   On: 07/24/2019 15:59   Scheduled Meds:  allopurinol  300 mg Oral QHS   atorvastatin  40 mg Oral q1800   cholecalciferol  1,000 Units Oral Daily   cloNIDine  0.2 mg Oral BID   donepezil  10 mg Oral QHS   furosemide  40 mg Oral Daily   mirtazapine  15 mg Oral QHS   polyethylene glycol  17 g Oral Daily   potassium chloride  40 mEq Oral Daily   risperiDONE  0.5 mg Oral Daily   risperiDONE  1 mg Oral QHS   traZODone  150 mg Oral QHS   valproic acid  500 mg Oral BID   vitamin B-12  1,000 mcg Oral Daily   Continuous Infusions:  dextrose 5 % and 0.9% NaCl 10 mL/hr at 07/25/19 1821   Principal Problem:   Hypoglycemia Active Problems:   Hypertension   Hyperlipidemia   Dementia (HCC)   Dementia with behavioral problem (HCC)   Cerebral atrophy (HCC)   Thrombocytopenia (HCC)   Venous insufficiency of both lower extremities   2+ pitting edema BLE  Time spent:   Irwin Brakeman, MD [facility] 07/26/2019, 1:33 PM    LOS: 1 day  How to contact the [facility] Attending or Consulting provider [place] or covering provider during after hours [place], for this patient?  1. Check the care team in [facility] and look for a) attending/consulting [facility] provider listed and b) the [facility] team listed 2. Log into [email or web address] and use [place]'s universal password to access. If you do not have the password, please contact the hospital operator. 3. Locate the [facility] provider you are looking for under [facility] and page to a number that you can be directly reached. 4. If you still have difficulty reaching the provider, please page the [facility] (Director on Call) for the Hospitalists listed on amion for assistance.

## 2019-07-27 LAB — GLUCOSE, CAPILLARY
Glucose-Capillary: 111 mg/dL — ABNORMAL HIGH (ref 70–99)
Glucose-Capillary: 121 mg/dL — ABNORMAL HIGH (ref 70–99)
Glucose-Capillary: 102 mg/dL — ABNORMAL HIGH (ref 70–99)
Glucose-Capillary: 94 mg/dL (ref 70–99)

## 2019-07-27 LAB — SARS CORONAVIRUS 2 BY RT PCR (HOSPITAL ORDER, PERFORMED IN ~~LOC~~ HOSPITAL LAB): SARS Coronavirus 2: NEGATIVE

## 2019-07-27 MED ORDER — HALOPERIDOL LACTATE 5 MG/ML IJ SOLN: 2.0000 mg | Freq: Four times a day (QID) | INTRAMUSCULAR | Status: DC | PRN

## 2019-07-27 MED ORDER — HALOPERIDOL LACTATE 5 MG/ML IJ SOLN
2.0000 mg | Freq: Once | INTRAMUSCULAR | Status: AC
  Administered 2019-07-27: 23:00:00 2 mg via INTRAVENOUS
  Filled 2019-07-27: qty 1

## 2019-07-27 NOTE — Progress Notes (Signed)
Has been calm today.  Took medications with no difficulty and has not tried to get out of bed.

## 2019-07-27 NOTE — Progress Notes (Signed)
PROGRESS NOTE  Marvin [name] TMH:[id number] DOB: 04-Jul-1951 DOA: 07/24/2019 PCP: Fayrene Helper, MD  Brief History:   68 y.o. male with a long past medical history of severe progressive dementia with behavioral disturbances who had been recently hospitalized with [facility] and subsequently discharged home after some medication changes, history of hyperlipidemia, hypertension, prediabetes, gout who was sent to ED by EMS after wife noticed that the patient has been altered in mentation and it has been progressively worsening over the past 3 days.  When EMS arrived and evaluated the patient they found him to be lethargic and was not responsive.  His blood glucose on arrival was 28.  He was treated with D10 and his mentation improved.  The patient has a long history of taking inappropriate medications likely related to his severe progressive dementia and there is insulin in the house but it is apparently locked in a bedroom according to the patient's wife.  Unfortunately she is sleeping during the day because she works nights and is not able to monitor him as closely during the day because she is sleeping.  She does not know of any specific incidence of the patient taking insulin or any diabetes medications.  However, she is not sure.  The patient was seen in the emergency department and a CT of the brain revealed cerebral atrophy but no acute findings.  The patient's blood sugar improved after treatments.  Poison control was contacted and they did review his home medications but did not feel that any of those medications could have contributed to his severe hypoglycemia.    Assessment/Plan: Acute metabolic Encephalopathy -due to hypoglycemia -UA ng for pyuria -initially placed on D5 drip with improvement on CBGs -d/c D5 drip and monitor -suspect a component of poor po intake -personally reviewed EKG--sinus, no STT changes -B12--477 -VPA level 62 -TSH 0.857  -CT brain--neg -discontinue sitter and monitor -am cortisol -check A1C   Impaired glucose tolerance -04/05/18 A1C--5.9 -continue CBG checks without sliding scale insulin  Dementia with behavioral disturbance -d/c sitter -pt has not had any aggressive behavior since admission--just needs re-directing due to impulsiveness and fall risk -PT eval -continue depakote and risperdal -continue aricept -continue trazodone and remeron  Essential Hypertension -continue clonidine  Hyperlipidemia  -continue statin  Thrombocytopenia -likely due to medications -monitor for signs of bleeding -am CBC   Total time spent 35 minutes.  Greater than 50% spent face to face counseling and coordinating care.    Disposition Plan:   SNF 8/13 or 921 if CBGs stable  Family Communication:  No Family at bedside  Consultants:  none  Code Status:  DNR  DVT Prophylaxis:  SCDs  Procedures: As Listed in Progress Note Above  Antibiotics: None       Subjective: Pt only intermittently answers question and following commands.  No reports of vomiting, diarrhea, sob, uncontrolled pain or Cp.  Objective: Vitals:   07/26/19 1405 07/26/19 2032 07/26/19 2113 07/27/19 0526  BP: (!) 167/94  (!) 167/83 (!) 145/86  Pulse: 63  63 87  Resp:    18  Temp: 98.2 F (36.8 C)  99.1 F (37.3 C) 98.7 F (37.1 C)  TempSrc: Oral  Axillary Oral  SpO2: 100% 99% 98% 99%  Weight:      Height:        Intake/Output Summary (Last 24 hours) at 07/27/2019 1357 Last data filed at 07/26/2019 2346 Gross per 24 hour  Intake 940 ml  Output 2050 ml  Net -1110 ml   Weight change:  Exam:   General:  Pt is alert, follows commands appropriately, not in acute distress  HEENT: No icterus, No thrush, No neck mass, [place]/AT  Cardiovascular: RRR, S1/S2, no rubs, no gallops  Respiratory: CTA bilaterally, no wheezing, no crackles, no rhonchi  Abdomen: Soft/+BS, non tender, non distended, no guarding  Extremities:  trace LE edema, No lymphangitis, No petechiae, No rashes, no synovitis   Data Reviewed: I have personally reviewed following labs and imaging studies Basic Metabolic Panel: Recent Labs  Lab 07/24/19 1255  NA 140  K 3.9  CL 108  CO2 23  GLUCOSE 119*  BUN 15  CREATININE 1.03  CALCIUM 8.6*  MG 1.9   Liver Function Tests: Recent Labs  Lab 07/24/19 1255  AST 15  ALT 14  ALKPHOS 82  BILITOT 0.7  PROT 6.6  ALBUMIN 3.1*   No results for input(s): LIPASE, AMYLASE in the last 168 hours. No results for input(s): AMMONIA in the last 168 hours. Coagulation Profile: No results for input(s): INR, PROTIME in the last 168 hours. CBC: Recent Labs  Lab 07/24/19 1255  WBC 4.1  NEUTROABS 2.5  HGB 12.8*  HCT 41.3  MCV 91.4  PLT 119*   Cardiac Enzymes: No results for input(s): CKTOTAL, CKMB, CKMBINDEX, TROPONINI in the last 168 hours. BNP: Invalid input(s): POCBNP CBG: Recent Labs  Lab 07/26/19 1628 07/26/19 2044 07/26/19 2203 07/27/19 0809 07/27/19 1145  GLUCAP 85 68* 114* 111* 121*   HbA1C: No results for input(s): HGBA1C in the last 72 hours. Urine analysis:    Component Value Date/Time   COLORURINE STRAW (A) 07/24/2019 1510   APPEARANCEUR CLEAR 07/24/2019 1510   APPEARANCEUR Clear 10/27/2017 1036   LABSPEC 1.008 07/24/2019 1510   PHURINE 7.0 07/24/2019 1510   GLUCOSEU NEGATIVE 07/24/2019 1510   HGBUR SMALL (A) 07/24/2019 1510   BILIRUBINUR NEGATIVE 07/24/2019 1510   BILIRUBINUR Negative 10/27/2017 1036   KETONESUR NEGATIVE 07/24/2019 1510   PROTEINUR NEGATIVE 07/24/2019 1510   NITRITE NEGATIVE 07/24/2019 [place] 07/24/2019 1510   Sepsis Labs: @LABRCNTIP (procalcitonin:4,lacticidven:4) ) Recent Results (from the past 240 hour(s))  SARS Coronavirus 2 [facility] order, Performed in [place] hospital lab)     Status: None   Collection Time: 07/24/19  2:40 PM  Result Value Ref Range Status   SARS Coronavirus 2 NEGATIVE NEGATIVE Final     Comment: (NOTE) If result is NEGATIVE SARS-CoV-2 target nucleic acids are NOT DETECTED. The SARS-CoV-2 RNA is generally detectable in upper and lower  respiratory specimens during the acute phase of infection. The lowest  concentration of SARS-CoV-2 viral copies this assay can detect is 250  copies / mL. A negative result does not preclude SARS-CoV-2 infection  and should not be used as the sole basis for treatment or other  patient management decisions.  A negative result may occur with  improper specimen collection / handling, submission of specimen other  than nasopharyngeal swab, presence of viral mutation(s) within the  areas targeted by this assay, and inadequate number of viral copies  (<250 copies / mL). A negative result must be combined with clinical  observations, patient history, and epidemiological information. If result is POSITIVE SARS-CoV-2 target nucleic acids are DETECTED. The SARS-CoV-2 RNA is generally detectable in upper and lower  respiratory specimens dur ing the acute phase of infection.  Positive  results are indicative of active infection with SARS-CoV-2.  Clinical  correlation with patient history and other diagnostic information is  necessary to determine patient infection status.  Positive results do  not rule out bacterial infection or co-infection with other viruses. If result is PRESUMPTIVE POSTIVE SARS-CoV-2 nucleic acids MAY BE PRESENT.   A presumptive positive result was obtained on the submitted specimen  and confirmed on repeat testing.  While 2019 novel coronavirus  (SARS-CoV-2) nucleic acids may be present in the submitted sample  additional confirmatory testing may be necessary for epidemiological  and / or clinical management purposes  to differentiate between  SARS-CoV-2 and other Sarbecovirus currently known to infect humans.  If clinically indicated additional testing with an alternate test  methodology [phone]) is advised. The  SARS-CoV-2 RNA is generally  detectable in upper and lower respiratory sp ecimens during the acute  phase of infection. The expected result is Negative. Fact Sheet for Patients:  StrictlyIdeas.no Fact Sheet for Healthcare Providers: BankingDealers.co.za This test is not yet approved or cleared by the [place] FDA and has been authorized for detection and/or diagnosis of SARS-CoV-2 by FDA under an Emergency Use Authorization (EUA).  This EUA will remain in effect (meaning this test can be used) for the duration of the COVID-19 declaration under Section 564(b)(1) of the Act, 21 U.S.C. section 360bbb-3(b)(1), unless the authorization is terminated or revoked sooner. Performed at [facility], [address], [place], [place] [address]      Scheduled Meds: . allopurinol  300 mg Oral QHS  . atorvastatin  40 mg Oral q1800  . cholecalciferol  1,000 Units Oral Daily  . cloNIDine  0.2 mg Oral BID  . donepezil  10 mg Oral QHS  . furosemide  40 mg Oral Daily  . mirtazapine  15 mg Oral QHS  . polyethylene glycol  17 g Oral Daily  . potassium chloride  40 mEq Oral Daily  . risperiDONE  0.5 mg Oral Daily  . risperiDONE  1 mg Oral QHS  . traZODone  150 mg Oral QHS  . valproic acid  500 mg Oral BID  . vitamin B-12  1,000 mcg Oral Daily   Continuous Infusions:  Procedures/Studies: Ct Head Wo Contrast  Result Date: 07/24/2019 CLINICAL DATA:  68 year old male with history of altered mental status for the past 3 days. EXAM: CT HEAD WITHOUT CONTRAST TECHNIQUE: Contiguous axial images were obtained from the base of the skull through the vertex without intravenous contrast. COMPARISON:  No priors. FINDINGS: Brain: Mild cerebral atrophy. Patchy and confluent areas of decreased attenuation are noted throughout the deep and periventricular white matter of the cerebral hemispheres bilaterally, compatible with chronic microvascular ischemic disease. No  evidence of acute infarction, hemorrhage, hydrocephalus, extra-axial collection or mass lesion/mass effect. Vascular: No hyperdense vessel or unexpected calcification. Skull: Normal. Negative for fracture or focal lesion. Sinuses/Orbits: No acute finding. Other: None. IMPRESSION: 1. No acute intracranial abnormalities. 2. Mild cerebral atrophy with chronic microvascular ischemic changes in the cerebral white matter, as above. Electronically Signed   By: Vinnie Langton M.D.   On: 07/24/2019 14:26   Dg Chest Port 1 View  Result Date: 07/24/2019 CLINICAL DATA:  Hypoglycemia. EXAM: PORTABLE CHEST 1 VIEW COMPARISON:  May 18, 2019 FINDINGS: Bibasilar opacities are platelike and favored represent atelectasis. Low lung volumes are noted. No pneumothorax. The cardiomediastinal silhouette is normal. No other abnormalities. IMPRESSION: Bibasilar opacities favored represent atelectasis. No other abnormalities. Electronically Signed   By: Dorise Bullion III M.D   On: 07/24/2019 15:59    Orson Eva,  DO  [facility] Pager [phone]  If 7PM-7AM, please contact night-coverage [email or web address] Password [email or web address] 07/27/2019, 1:57 PM   LOS: 2 days

## 2019-07-27 NOTE — Care Management Important Message (Signed)
Important Message  Patient Details  Name: Marvin [name] MRN: [id number] Date of Birth: 05/12/1951   Medicare Important Message Given:  Yes     Tommy Medal 07/27/2019, 2:00 PM

## 2019-07-27 NOTE — TOC Progression Note (Signed)
Transition of Care [facility]) - Progression Note    Patient Details  Name: Marvin COPEN MRN: [id number] Date of Birth: 09-Jan-1951  [facility] CM/SW Contact  Boneta Lucks, RN Phone Number: 07/27/2019, 2:00 PM  Clinical Narrative:   Kennon Holter discontinued, [place].  They are checking to make sure patient is Medicaid Pending.      Expected Discharge Plan: Memory Care Barriers to Discharge: Other (comment)(Sitter need)  Expected Discharge Plan and Services Expected Discharge Plan: Memory Care   Discharge Planning Services: CM Consult   Living arrangements for the past 2 months: Single Family Home Expected Discharge Date: (unknown)                      Readmission Risk Interventions No flowsheet data found.

## 2019-07-27 NOTE — Telephone Encounter (Signed)
Wife aware

## 2019-07-28 ENCOUNTER — Emergency Department (HOSPITAL_COMMUNITY)
Admission: EM | Admit: 2019-07-28 | Discharge: 2019-07-29 | Disposition: A | Discharge status: Home / Self Care | Payer: Medicare HMO | Attending: Emergency Medicine | Admitting: Emergency Medicine

## 2019-07-28 ENCOUNTER — Other Ambulatory Visit: Payer: Self-pay

## 2019-07-28 ENCOUNTER — Encounter (HOSPITAL_COMMUNITY): Payer: Self-pay | Admitting: Emergency Medicine

## 2019-07-28 DIAGNOSIS — I10 Essential (primary) hypertension: Secondary | ICD-10-CM | POA: Insufficient documentation

## 2019-07-28 DIAGNOSIS — R451 Restlessness and agitation: Secondary | ICD-10-CM | POA: Insufficient documentation

## 2019-07-28 DIAGNOSIS — Z87891 Personal history of nicotine dependence: Secondary | ICD-10-CM | POA: Diagnosis not present

## 2019-07-28 DIAGNOSIS — Z20828 Contact with and (suspected) exposure to other viral communicable diseases: Secondary | ICD-10-CM | POA: Insufficient documentation

## 2019-07-28 DIAGNOSIS — R69 Illness, unspecified: Secondary | ICD-10-CM | POA: Diagnosis not present

## 2019-07-28 DIAGNOSIS — Z03818 Encounter for observation for suspected exposure to other biological agents ruled out: Secondary | ICD-10-CM | POA: Diagnosis not present

## 2019-07-28 DIAGNOSIS — F0391 Unspecified dementia with behavioral disturbance: Secondary | ICD-10-CM

## 2019-07-28 DIAGNOSIS — R000 Tachycardia, unspecified: Secondary | ICD-10-CM | POA: Diagnosis not present

## 2019-07-28 DIAGNOSIS — Z79899 Other long term (current) drug therapy: Secondary | ICD-10-CM | POA: Insufficient documentation

## 2019-07-28 DIAGNOSIS — F0281 Dementia in other diseases classified elsewhere with behavioral disturbance: Secondary | ICD-10-CM | POA: Insufficient documentation

## 2019-07-28 LAB — BASIC METABOLIC PANEL
Anion gap: 10 (ref 5–15)
BUN: 17 mg/dL (ref 8–23)
CO2: 25 mmol/L (ref 22–32)
Calcium: 8.8 mg/dL — ABNORMAL LOW (ref 8.9–10.3)
Chloride: 102 mmol/L (ref 98–111)
Creatinine, Ser: 0.96 mg/dL (ref 0.61–1.24)
GFR calc Af Amer: >60 mL/min (ref >60)
GFR calc non Af Amer: >60 mL/min (ref >60)
Glucose, Bld: 107 mg/dL — ABNORMAL HIGH (ref 70–99)
Potassium: 4.7 mmol/L (ref 3.5–5.1)
Sodium: 137 mmol/L (ref 135–145)

## 2019-07-28 LAB — CORTISOL-AM, BLOOD: Cortisol - AM: 11.8 ug/dL (ref 6.7–22.6)

## 2019-07-28 LAB — CBC
HCT: 41.7 % (ref 39.0–52.0)
Hemoglobin: 14 g/dL (ref 13.0–17.0)
MCH: 29.1 pg (ref 26.0–34.0)
MCHC: 33.6 g/dL (ref 30.0–36.0)
MCV: 86.7 fL (ref 80.0–100.0)
Platelets: 223 10*3/uL (ref 150–400)
RBC: 4.81 MIL/uL (ref 4.22–5.81)
RDW: 16.6 % — ABNORMAL HIGH (ref 11.5–15.5)
WBC: 5.4 10*3/uL (ref 4.0–10.5)
nRBC: 0 % (ref 0.0–0.2)

## 2019-07-28 LAB — GLUCOSE, CAPILLARY
Glucose-Capillary: 99 mg/dL (ref 70–99)
Glucose-Capillary: 91 mg/dL (ref 70–99)

## 2019-07-28 LAB — FOLATE: Folate: 7.9 ng/mL (ref >5.9)

## 2019-07-28 MED ORDER — LORAZEPAM 1 MG PO TABS: 0.5000 mg | ORAL_TABLET | Freq: Every day | ORAL | 0 refills | Status: DC

## 2019-07-28 NOTE — ED Triage Notes (Addendum)
Pt brought in by [facility] from [facility] of [place]. Past hx of Dementia with behavioral disturbances. EMS states they received report from nursing home that pt blood pressure was elevated and wondering around the facility. When EMS arrived pt was sitting in chair with a pack of crackers.

## 2019-07-28 NOTE — TOC Transition Note (Addendum)
Transition of Care [facility]) - CM/SW Discharge Note   Patient Details  Name: Marvin [name] MRN: [id number] Date of Birth: 12-27-1950  Transition of Care [facility]) CM/SW Contact:  Boneta Lucks, RN Phone Number: 07/28/2019, 12:27 PM   Clinical Narrative:   [facility]ie [facility]ette will accept patient today Medicaid pending. MD states patient is medically stable and will discharge today. CM sent EMS form to RN an updated. Left Pam - wife a message, she works nights and is sleeping. She checks with CM in the late afternoon.   Addendum: Wife called, updated, she will call [facility] to take belongings. Doug updates and aware of the transport.  Final next level of care: Skilled Nursing Facility Barriers to Discharge: No Barriers Identified   Patient Goals and CMS Choice Patient states their goals for this hospitalization and ongoing recovery are:: to go to long term CMS Medicare.gov Compare Post Acute Care list provided to:: Patient Represenative (must comment)(Wife) Choice offered to / list presented to : Spouse  Discharge Placement               [facility]        Discharge Plan and Services   Discharge Planning Services: CM Consult Post Acute Care Choice: [place]                   Readmission Risk Interventions No flowsheet data found.

## 2019-07-28 NOTE — Discharge Summary (Signed)
Physician Discharge Summary  Marvin [name] JJO:[id number] DOB: [birth year] DOA: 07/24/2019  PCP: Fayrene Helper, MD  Admit date: 07/24/2019 Discharge date: 07/28/2019  Admitted From: Home Disposition:  Home  Recommendations for Outpatient Follow-up:  1. Follow up with PCP in 1-2 weeks 2. Please obtain BMP/CBC in one week    Discharge Condition: Stable CODE STATUS: DNR Diet recommendation: Regular   Brief/Interim Summary: 68 y.o.malewith a long past medical history of severe progressive dementia with behavioral disturbances who had been recently hospitalized with [facility] and subsequently discharged home after some medication changes, history of hyperlipidemia, hypertension, prediabetes, gout who was sent to ED by EMS after wife noticed that the patient has been altered in mentation and it has been progressively worsening over the past 3 days. When EMS arrived and evaluated the patient they found him to be lethargic and was not responsive. His blood glucose on arrival was 28. He was treated with D10 and his mentation improved. The patient has a long history of taking inappropriate medications likely related to his severe progressive dementia and there is insulin in the house but it is apparently locked in a bedroom according to the patient's wife. Unfortunately she is sleeping during the day because she works nights and is not able to monitor him as closely during the day because she is sleeping. She does not know of any specific incidence of the patient taking insulin or any diabetes medications. However, she is not sure. The patient was seen in the emergency department and a CT of the brain revealed cerebral atrophy but no acute findings. The patient's blood sugar improved after treatments. Poison control was contacted and they did review his home medications but did not feel that any of those medications could have contributed to his severe  hypoglycemia.    Discharge Diagnoses:  Acute metabolic Encephalopathy -due to hypoglycemia -UA ng for pyuria -initially placed on D5 drip with improvement on CBGs -d/c D5 drip and monitor-->CBGs remained stable without further hypoglycemia -suspect a component of poor po intake -personally reviewed EKG--sinus, no STT changes -B12--477 -VPA level 62 -TSH 0.857 -CT brain--neg -discontinue sitter and monitor -am cortisol--pending at ime of d/c   Impaired glucose tolerance -04/05/18 A1C--5.9 -continue CBG checks without sliding scale insulin  Dementia with behavioral disturbance -d/c sitter -pt has not had any aggressive behavior since admission--just needs re-directing due to impulsiveness and fall risk -PT eval -continue depakote and risperdal -continue aricept -continue trazodone and remeron  Essential Hypertension -continue clonidine -restart amlodipine -BP remains acceptable  Hyperlipidemia  -continue statin  Thrombocytopenia -likely due to medications -monitor for signs of bleeding -improved  -platelets 223 on day of d/c -?platelet clumping -no signs of bleeding    Discharge Instructions   Allergies as of 07/28/2019      Reactions   Aspirin Other (See Comments)   Stomach upset   Namenda [memantine Hcl] Other (See Comments)   Increased dementia behavior issues   Naprosyn [naproxen] Hives   Penicillins    Did it involve swelling of the face/tongue/throat, SOB, or low BP? Unknown Did it involve sudden or severe rash/hives, skin peeling, or any reaction on the inside of your mouth or nose? Unknown Did you need to seek medical attention at a hospital or doctor's office? Unknown When did it last happen?Unknown If all above answers are NO, may proceed with cephalosporin use.      Medication List    TAKE these medications   allopurinol 300 MG  tablet Commonly known as: ZYLOPRIM Take 300 mg by mouth at bedtime.   amLODipine 10 MG  tablet Commonly known as: NORVASC Take 10 mg by mouth daily.   atorvastatin 40 MG tablet Commonly known as: LIPITOR Take 1 tablet by mouth once daily   cholecalciferol 25 MCG (1000 UT) tablet Commonly known as: VITAMIN D3 Take 1,000 Units by mouth daily.   cloNIDine 0.2 MG tablet Commonly known as: CATAPRES TAKE 1 TABLET BY MOUTH THREE TIMES DAILY What changed:   how much to take  when to take this  additional instructions   donepezil 10 MG tablet Commonly known as: ARICEPT Take 1 tablet (10 mg total) by mouth at bedtime.   LORazepam 1 MG tablet Commonly known as: Ativan Take 0.5 tablets (0.5 mg total) by mouth daily.   mirtazapine 15 MG tablet Commonly known as: Remeron Take 1 tablet (15 mg total) by mouth at bedtime.   polyethylene glycol 17 g packet Commonly known as: MIRALAX / GLYCOLAX Take 17 g by mouth daily.   risperiDONE 1 MG tablet Commonly known as: RisperDAL Take 1 tablet (1 mg total) by mouth at bedtime.   risperiDONE 0.5 MG tablet Commonly known as: RisperDAL Take 1 tablet (0.5 mg total) by mouth at bedtime.   traZODone 150 MG tablet Commonly known as: DESYREL Take 1 tablet (150 mg total) by mouth at bedtime.   valproic acid 250 MG/5ML Soln solution Commonly known as: DEPAKENE Take 10 cc by mouth every 12 hours   vitamin B-12 1000 MCG tablet Commonly known as: CYANOCOBALAMIN Take 1,000 mcg by mouth daily.       Allergies  Allergen Reactions   Aspirin Other (See Comments)    Stomach upset   Namenda [Memantine Hcl] Other (See Comments)    Increased dementia behavior issues   Naprosyn [Naproxen] Hives   Penicillins     Did it involve swelling of the face/tongue/throat, SOB, or low BP? Unknown Did it involve sudden or severe rash/hives, skin peeling, or any reaction on the inside of your mouth or nose? Unknown Did you need to seek medical attention at a hospital or doctor's office? Unknown When did it last happen?Unknown If  all above answers are NO, may proceed with cephalosporin use.     Consultations:  none   Procedures/Studies: Ct Head Wo Contrast  Result Date: 07/24/2019 CLINICAL DATA:  68 year old male with history of altered mental status for the past 3 days. EXAM: CT HEAD WITHOUT CONTRAST TECHNIQUE: Contiguous axial images were obtained from the base of the skull through the vertex without intravenous contrast. COMPARISON:  No priors. FINDINGS: Brain: Mild cerebral atrophy. Patchy and confluent areas of decreased attenuation are noted throughout the deep and periventricular white matter of the cerebral hemispheres bilaterally, compatible with chronic microvascular ischemic disease. No evidence of acute infarction, hemorrhage, hydrocephalus, extra-axial collection or mass lesion/mass effect. Vascular: No hyperdense vessel or unexpected calcification. Skull: Normal. Negative for fracture or focal lesion. Sinuses/Orbits: No acute finding. Other: None. IMPRESSION: 1. No acute intracranial abnormalities. 2. Mild cerebral atrophy with chronic microvascular ischemic changes in the cerebral white matter, as above. Electronically Signed   By: Vinnie Langton M.D.   On: 07/24/2019 14:26   Dg Chest Port 1 View  Result Date: 07/24/2019 CLINICAL DATA:  Hypoglycemia. EXAM: PORTABLE CHEST 1 VIEW COMPARISON:  May 18, 2019 FINDINGS: Bibasilar opacities are platelike and favored represent atelectasis. Low lung volumes are noted. No pneumothorax. The cardiomediastinal silhouette is normal. No other abnormalities. IMPRESSION: Bibasilar  opacities favored represent atelectasis. No other abnormalities. Electronically Signed   By: Dorise Bullion III M.D   On: 07/24/2019 15:59        Discharge Exam: Vitals:   07/27/19 2113 07/28/19 0552  BP: (!) 174/86 (!) 145/66  Pulse: 92 (!) 55  Resp: 17 17  Temp: 98.3 F (36.8 C) 98.2 F (36.8 C)  SpO2: 98% 100%   Vitals:   07/27/19 0526 07/27/19 1432 07/27/19 2113 07/28/19 0552   BP: (!) 145/86 (!) 144/94 (!) 174/86 (!) 145/66  Pulse: 87 77 92 (!) 55  Resp: 18 16 17 17   Temp: 98.7 F (37.1 C) 98.4 F (36.9 C) 98.3 F (36.8 C) 98.2 F (36.8 C)  TempSrc: Oral Axillary    SpO2: 99%  98% 100%  Weight:      Height:        General: Pt is alert, awake, not in acute distress Cardiovascular: RRR, S1/S2 +, no rubs, no gallops Respiratory: CTA bilaterally, no wheezing, no rhonchi Abdominal: Soft, NT, ND, bowel sounds + Extremities: no edema, no cyanosis   The results of significant diagnostics from this hospitalization (including imaging, microbiology, ancillary and laboratory) are listed below for reference.    Significant Diagnostic Studies: Ct Head Wo Contrast  Result Date: 07/24/2019 CLINICAL DATA:  68 year old male with history of altered mental status for the past 3 days. EXAM: CT HEAD WITHOUT CONTRAST TECHNIQUE: Contiguous axial images were obtained from the base of the skull through the vertex without intravenous contrast. COMPARISON:  No priors. FINDINGS: Brain: Mild cerebral atrophy. Patchy and confluent areas of decreased attenuation are noted throughout the deep and periventricular white matter of the cerebral hemispheres bilaterally, compatible with chronic microvascular ischemic disease. No evidence of acute infarction, hemorrhage, hydrocephalus, extra-axial collection or mass lesion/mass effect. Vascular: No hyperdense vessel or unexpected calcification. Skull: Normal. Negative for fracture or focal lesion. Sinuses/Orbits: No acute finding. Other: None. IMPRESSION: 1. No acute intracranial abnormalities. 2. Mild cerebral atrophy with chronic microvascular ischemic changes in the cerebral white matter, as above. Electronically Signed   By: Vinnie Langton M.D.   On: 07/24/2019 14:26   Dg Chest Port 1 View  Result Date: 07/24/2019 CLINICAL DATA:  Hypoglycemia. EXAM: PORTABLE CHEST 1 VIEW COMPARISON:  May 18, 2019 FINDINGS: Bibasilar opacities are platelike  and favored represent atelectasis. Low lung volumes are noted. No pneumothorax. The cardiomediastinal silhouette is normal. No other abnormalities. IMPRESSION: Bibasilar opacities favored represent atelectasis. No other abnormalities. Electronically Signed   By: Dorise Bullion III M.D   On: 07/24/2019 15:59     Microbiology: Recent Results (from the past 240 hour(s))  SARS Coronavirus 2 [facility] order, Performed in [facility] hospital lab)     Status: None   Collection Time: 07/24/19  2:40 PM  Result Value Ref Range Status   SARS Coronavirus 2 NEGATIVE NEGATIVE Final    Comment: (NOTE) If result is NEGATIVE SARS-CoV-2 target nucleic acids are NOT DETECTED. The SARS-CoV-2 RNA is generally detectable in upper and lower  respiratory specimens during the acute phase of infection. The lowest  concentration of SARS-CoV-2 viral copies this assay can detect is 250  copies / mL. A negative result does not preclude SARS-CoV-2 infection  and should not be used as the sole basis for treatment or other  patient management decisions.  A negative result may occur with  improper specimen collection / handling, submission of specimen other  than nasopharyngeal swab, presence of viral mutation(s) within the  areas targeted  by this assay, and inadequate number of viral copies  (<250 copies / mL). A negative result must be combined with clinical  observations, patient history, and epidemiological information. If result is POSITIVE SARS-CoV-2 target nucleic acids are DETECTED. The SARS-CoV-2 RNA is generally detectable in upper and lower  respiratory specimens dur ing the acute phase of infection.  Positive  results are indicative of active infection with SARS-CoV-2.  Clinical  correlation with patient history and other diagnostic information is  necessary to determine patient infection status.  Positive results do  not rule out bacterial infection or co-infection with other viruses. If result is  PRESUMPTIVE POSTIVE SARS-CoV-2 nucleic acids MAY BE PRESENT.   A presumptive positive result was obtained on the submitted specimen  and confirmed on repeat testing.  While 2019 novel coronavirus  (SARS-CoV-2) nucleic acids may be present in the submitted sample  additional confirmatory testing may be necessary for epidemiological  and / or clinical management purposes  to differentiate between  SARS-CoV-2 and other Sarbecovirus currently known to infect humans.  If clinically indicated additional testing with an alternate test  methodology [phone]) is advised. The SARS-CoV-2 RNA is generally  detectable in upper and lower respiratory sp ecimens during the acute  phase of infection. The expected result is Negative. Fact Sheet for Patients:  StrictlyIdeas.no Fact Sheet for Healthcare Providers: BankingDealers.co.za This test is not yet approved or cleared by the [place] FDA and has been authorized for detection and/or diagnosis of SARS-CoV-2 by FDA under an Emergency Use Authorization (EUA).  This EUA will remain in effect (meaning this test can be used) for the duration of the COVID-19 declaration under Section 564(b)(1) of the Act, 21 U.S.C. section 360bbb-3(b)(1), unless the authorization is terminated or revoked sooner. Performed at [facility], [address] [place], [place] [address]   SARS Coronavirus 2 [facility] order, Performed in [place] hospital lab)     Status: None   Collection Time: 07/27/19  6:07 PM  Result Value Ref Range Status   SARS Coronavirus 2 NEGATIVE NEGATIVE Final    Comment: (NOTE) If result is NEGATIVE SARS-CoV-2 target nucleic acids are NOT DETECTED. The SARS-CoV-2 RNA is generally detectable in upper and lower  respiratory specimens during the acute phase of infection. The lowest  concentration of SARS-CoV-2 viral copies this assay can detect is 250  copies / mL. A negative result does not  preclude SARS-CoV-2 infection  and should not be used as the sole basis for treatment or other  patient management decisions.  A negative result may occur with  improper specimen collection / handling, submission of specimen other  than nasopharyngeal swab, presence of viral mutation(s) within the  areas targeted by this assay, and inadequate number of viral copies  (<250 copies / mL). A negative result must be combined with clinical  observations, patient history, and epidemiological information. If result is POSITIVE SARS-CoV-2 target nucleic acids are DETECTED. The SARS-CoV-2 RNA is generally detectable in upper and lower  respiratory specimens dur ing the acute phase of infection.  Positive  results are indicative of active infection with SARS-CoV-2.  Clinical  correlation with patient history and other diagnostic information is  necessary to determine patient infection status.  Positive results do  not rule out bacterial infection or co-infection with other viruses. If result is PRESUMPTIVE POSTIVE SARS-CoV-2 nucleic acids MAY BE PRESENT.   A presumptive positive result was obtained on the submitted specimen  and confirmed on repeat testing.  While 2019  novel coronavirus  (SARS-CoV-2) nucleic acids may be present in the submitted sample  additional confirmatory testing may be necessary for epidemiological  and / or clinical management purposes  to differentiate between  SARS-CoV-2 and other Sarbecovirus currently known to infect humans.  If clinically indicated additional testing with an alternate test  methodology [phone]) is advised. The SARS-CoV-2 RNA is generally  detectable in upper and lower respiratory sp ecimens during the acute  phase of infection. The expected result is Negative. Fact Sheet for Patients:  StrictlyIdeas.no Fact Sheet for Healthcare Providers: BankingDealers.co.za This test is not yet approved or cleared by  the [place] FDA and has been authorized for detection and/or diagnosis of SARS-CoV-2 by FDA under an Emergency Use Authorization (EUA).  This EUA will remain in effect (meaning this test can be used) for the duration of the COVID-19 declaration under Section 564(b)(1) of the Act, 21 U.S.C. section 360bbb-3(b)(1), unless the authorization is terminated or revoked sooner. Performed at [facility], [address], [place], [place] [address]      Labs: Basic Metabolic Panel: Recent Labs  Lab 07/24/19 1255 07/28/19 0830  NA 140 137  K 3.9 4.7  CL 108 102  CO2 23 25  GLUCOSE 119* 107*  BUN 15 17  CREATININE 1.03 0.96  CALCIUM 8.6* 8.8*  MG 1.9  --    Liver Function Tests: Recent Labs  Lab 07/24/19 1255  AST 15  ALT 14  ALKPHOS 82  BILITOT 0.7  PROT 6.6  ALBUMIN 3.1*   No results for input(s): LIPASE, AMYLASE in the last 168 hours. No results for input(s): AMMONIA in the last 168 hours. CBC: Recent Labs  Lab 07/24/19 1255 07/28/19 0830  WBC 4.1 5.4  NEUTROABS 2.5  --   HGB 12.8* 14.0  HCT 41.3 41.7  MCV 91.4 86.7  PLT 119* 223   Cardiac Enzymes: No results for input(s): CKTOTAL, CKMB, CKMBINDEX, TROPONINI in the last 168 hours. BNP: Invalid input(s): POCBNP CBG: Recent Labs  Lab 07/27/19 1145 07/27/19 1558 07/27/19 2010 07/28/19 0721 07/28/19 1107  GLUCAP 121* 102* 94 99 91    Time coordinating discharge:  36 minutes  Signed:  Orson Eva, DO [facility] Pager: [phone] 07/28/2019, 12:10 PM

## 2019-07-28 NOTE — ED Provider Notes (Addendum)
[facility] EMERGENCY DEPARTMENT Provider Note   CSN: [id number] Arrival date & time: 07/28/19  2316    History   Chief Complaint Chief Complaint  Patient presents with  . Altered Mental Status    HPI Marvin [name] is a 68 y.o. male.     Patient presents to the emergency department from skilled nursing facility.  Appears that the patient was just placed in the skilled nursing facility after discharge from this hospital.  He has a history of severe progressive dementia and has been agitated and apparently wandering around the facility tonight, difficult to manage.  EMS was called and instructed to take the patient to [facility] for psychiatric evaluation.  EMS brought the patient here instead, unclear why or who authorized.     Past Medical History:  Diagnosis Date  . Degenerative joint disease   . Dementia ([place])   . Gout   . Hemorrhoids    Dr. Romona Curls  . Hyperlipidemia   . Hypertension   . Impaired glucose tolerance   . Premature atrial contractions     Patient Active Problem List   Diagnosis Date Noted  . Hypoglycemia 07/24/2019  . Cerebral atrophy ([place]) 07/24/2019  . Thrombocytopenia ([place]) 07/24/2019  . Venous insufficiency of both lower extremities 07/24/2019  . 2+ pitting edema BLE 07/24/2019  . Hospital discharge follow-up 06/26/2019  . Dementia ([place]) 06/26/2019  . Dementia with behavioral problem ([place]) 06/26/2019  . Prediabetes 04/13/2018  . Hyperuricemia 04/13/2018  . Memory loss 01/12/2017  . Metabolic syndrome X 51/70/0174  . Obesity (BMI 30.0-34.9) 06/26/2013  . Hypertension   . Hyperlipidemia   . Degenerative joint disease   . Premature atrial contractions   . IGT (impaired glucose tolerance) 02/07/2010    Past Surgical History:  Procedure Laterality Date  . COLONOSCOPY  07/2004  . COLONOSCOPY N/A 09/22/2014   Procedure: COLONOSCOPY;  Surgeon: Daneil Dolin, MD;  Location: [facility];  Service: Endoscopy;  Laterality: N/A;   9:30 AM        PT REQUEST TIME  . KNEE ARTHROSCOPY     Right        Home Medications    Prior to Admission medications   Medication Sig Start Date End Date Taking? Authorizing Provider  allopurinol (ZYLOPRIM) 300 MG tablet Take 300 mg by mouth at bedtime.    [provider]  amLODipine (NORVASC) 10 MG tablet Take 10 mg by mouth daily.    [provider]  atorvastatin (LIPITOR) 40 MG tablet Take 1 tablet by mouth once daily 07/05/19   Fayrene Helper, MD  cholecalciferol (VITAMIN D3) 25 MCG (1000 UT) tablet Take 1,000 Units by mouth daily.    [provider]  cloNIDine (CATAPRES) 0.2 MG tablet TAKE 1 TABLET BY MOUTH THREE TIMES DAILY Patient taking differently: Take 0.2-0.4 mg by mouth See admin instructions. 0.2mg  in the morning and 0.4mg  at bedtime 03/18/19   Fayrene Helper, MD  donepezil (ARICEPT) 10 MG tablet Take 1 tablet (10 mg total) by mouth at bedtime. 04/27/18   Ward Givens, NP  LORazepam (ATIVAN) 1 MG tablet Take 0.5 tablets (0.5 mg total) by mouth daily. 07/28/19   Orson Eva, MD  mirtazapine (REMERON) 15 MG tablet Take 1 tablet (15 mg total) by mouth at bedtime. 07/07/19   Fayrene Helper, MD  polyethylene glycol (MIRALAX / GLYCOLAX) 17 g packet Take 17 g by mouth daily.    [provider]  risperiDONE (RISPERDAL) 0.5 MG  tablet Take 1 tablet (0.5 mg total) by mouth at bedtime. 07/07/19   Fayrene Helper, MD  risperiDONE (RISPERDAL) 1 MG tablet Take 1 tablet (1 mg total) by mouth at bedtime. 07/07/19   Fayrene Helper, MD  traZODone (DESYREL) 150 MG tablet Take 1 tablet (150 mg total) by mouth at bedtime. 07/08/19   Fayrene Helper, MD  valproic acid (DEPAKENE) 250 MG/5ML SOLN solution Take 10 cc by mouth every 12 hours 07/07/19   Fayrene Helper, MD  vitamin B-12 (CYANOCOBALAMIN) 1000 MCG tablet Take 1,000 mcg by mouth daily.    [provider]    Family History Family History  Problem Relation Age of Onset   . Alzheimer's disease Mother   . Diabetes Father   . Heart failure Father        prostate disease   . Alzheimer's disease Sister   . Kidney failure Brother   . Diabetes Brother   . High blood pressure Brother   . Cancer Brother     Social History Social History   Tobacco Use  . Smoking status: Former Smoker    Quit date: 1992    Years since quitting: 28.6  . Smokeless tobacco: Never Used  Substance Use Topics  . Alcohol use: No    Comment: Quit 1992  . Drug use: No     Allergies   Aspirin, Namenda [memantine hcl], Naprosyn [naproxen], and Penicillins   Review of Systems Review of Systems  Unable to perform ROS: Dementia     Physical Exam Updated Vital Signs BP (!) 179/91   Pulse 85   Temp 98.6 F (37 C) (Oral)   Resp 20   Wt 102 kg   SpO2 97%   BMI 30.50 kg/m   Physical Exam Vitals signs and nursing note reviewed.  Constitutional:      General: He is not in acute distress.    Appearance: Normal appearance. He is well-developed.  HENT:     Head: Normocephalic and atraumatic.     Right Ear: Hearing normal.     Left Ear: Hearing normal.     Nose: Nose normal.  Eyes:     Conjunctiva/sclera: Conjunctivae normal.     Pupils: Pupils are equal, round, and reactive to light.  Neck:     Musculoskeletal: Normal range of motion and neck supple.  Cardiovascular:     Rate and Rhythm: Regular rhythm.     Heart sounds: S1 normal and S2 normal. No murmur. No friction rub. No gallop.   Pulmonary:     Effort: Pulmonary effort is normal. No respiratory distress.     Breath sounds: Normal breath sounds.  Chest:     Chest wall: No tenderness.  Abdominal:     General: Bowel sounds are normal.     Palpations: Abdomen is soft.     Tenderness: There is no abdominal tenderness. There is no guarding or rebound. Negative signs include Murphy's sign and McBurney's sign.     Hernia: No hernia is present.  Musculoskeletal: Normal range of motion.  Skin:    General:  Skin is warm and dry.     Findings: No rash.  Neurological:     Mental Status: He is alert and oriented to person, place, and time.     GCS: GCS eye subscore is 3. GCS verbal subscore is 4. GCS motor subscore is 6.     Cranial Nerves: No cranial nerve deficit.     Sensory: No sensory deficit.  Coordination: Coordination normal.  Psychiatric:        Speech: Speech is delayed and tangential.        Behavior: Behavior is slowed and withdrawn.      ED Treatments / Results  Labs (all labs ordered are listed, but only abnormal results are displayed) Labs Reviewed  CBC WITH DIFFERENTIAL/PLATELET - Abnormal; Notable for the following components:      Result Value   RDW 16.7 (*)    All other components within normal limits  COMPREHENSIVE METABOLIC PANEL - Abnormal; Notable for the following components:   Glucose, Bld 120 (*)    All other components within normal limits  URINALYSIS, ROUTINE W REFLEX MICROSCOPIC - Abnormal; Notable for the following components:   Color, Urine AMBER (*)    Hgb urine dipstick SMALL (*)    All other components within normal limits  ACETAMINOPHEN LEVEL - Abnormal; Notable for the following components:   Acetaminophen (Tylenol), Serum <10 (*)    All other components within normal limits  RAPID URINE DRUG SCREEN, HOSP PERFORMED - Abnormal; Notable for the following components:   Benzodiazepines POSITIVE (*)    All other components within normal limits  VALPROIC ACID LEVEL - Abnormal; Notable for the following components:   Valproic Acid Lvl 39 (*)    All other components within normal limits  SARS CORONAVIRUS 2 (HOSPITAL ORDER, [facility])  ETHANOL  AMMONIA  SALICYLATE LEVEL    EKG EKG Interpretation  Date/Time:  Thursday July 28 2019 23:26:24 EDT Ventricular Rate:  108 PR Interval:    QRS Duration: 90 QT Interval:  299 QTC Calculation: 401 R Axis:   53 Text Interpretation:  Sinus tachycardia Atrial premature  complex Anteroseptal infarct, old Borderline repolarization abnormality Confirmed by Orpah Greek [phone]) on 07/29/2019 1:28:40 AM   Radiology No results found.  Procedures Procedures (including critical care time)  Medications Ordered in ED Medications  risperiDONE (RISPERDAL M-TABS) disintegrating tablet 2 mg (has no administration in time range)    And  LORazepam (ATIVAN) tablet 1 mg (has no administration in time range)    And  ziprasidone (GEODON) injection 20 mg (has no administration in time range)     Initial Impression / Assessment and Plan / ED Course  I have reviewed the triage vital signs and the nursing notes.  Pertinent labs & imaging results that were available during my care of the patient were reviewed by me and considered in my medical decision making (see chart for details).        Patient presents to the emergency department for evaluation of agitation.  Patient was just hospitalized here at this institution for altered mental status secondary to refractory hypoglycemia.  He was discharged from this hospital to a new skilled nursing facility.  He has been there for 1 day, apparently became agitated tonight.  Facility tried to send him to [facility] for psychiatric evaluation.  EMS brought him here instead.  Patient is cooperative and calm here in the ER.  I suspect that the skilled nursing facility simply did not try to medicate him or manage him and he will not need a Geri psych admission.  Will obtain TTS consultation to confirm.  Patient medically clear for psychiatric evaluation.  Addendum at 2:30 AM patient has been evaluated by behavioral health and they agree that his agitation is secondary to his dementia, does not require psychiatric treatment.  Recommend return to skilled nursing facility.  Final Clinical Impressions(s) /  ED Diagnoses   Final diagnoses:  Dementia with behavioral disturbance, unspecified dementia type [facility])    ED  Discharge Orders    None       Pollina, Gwenyth Allegra, MD 07/29/19 0131    Orpah Greek, MD 07/29/19 [phone]

## 2019-07-28 NOTE — Evaluation (Signed)
Physical Therapy Evaluation Patient Details Name: Marvin [name] MRN: [id number] DOB: [birth year] Today's Date: 07/28/2019   History of Present Illness  Marvin [name] is a 68 y.o. male with a long past medical history of severe progressive dementia with behavioral disturbances who had been recently hospitalized with [facility] and subsequently discharged home after some medication changes, history of hyperlipidemia, hypertension, prediabetes, gout who was sent to ED by EMS after wife noticed that the patient has been altered in mentation and it has been progressively worsening over the past 3 days.  The patient has been eating and drinking.  When EMS arrived and evaluated the patient they found him to be lethargic and was not responsive.  His blood glucose on arrival was 28.  He was treated with D10 and his mentation improved.  The patient has a long history of taking inappropriate medications likely related to his severe progressive dementia and there is insulin in the house but it is apparently locked in a bedroom according to the patient's wife.  Unfortunately she is sleeping during the day because she works nights and is not able to monitor him as closely during the day because she is sleeping.  She does not know of any specific incidence of the patient taking insulin or any diabetes medications.  However, she is not sure.  The patient was seen in the emergency department and a CT of the brain revealed cerebral atrophy but no acute findings.  The patient's blood sugar improved after treatments.  Poison control was contacted and they did review his home medications but did not feel that any of those medications could have contributed to his severe hypoglycemia.  The patient has been eating and drinking.  Patient's urinalysis and drug screen is pending at this time.  SARS 2 COVID-19 testing is pending. CXR and EKG pending.  Given the patient had a severe hypoglycemic event  observation was requested for further monitoring for recurrence of hypoglycemia.    Clinical Impression  Patient limited for functional mobility as stated below secondary to BLE weakness, fatigue and poor standing balance.  Patient demonstrates slow labored cadence with decrease in balance once fatigue resulting in increased for falls, had to rely more on BUE for support when returning to room using RW and tolerated sitting up in chair after therapy - nursing staff notified.   Patient will benefit from continued physical therapy in hospital and recommended venue below to increase strength, balance, endurance for safe ADLs and gait.     Follow Up Recommendations SNF    Equipment Recommendations  Rolling walker with 5" wheels    Recommendations for Other Services       Precautions / Restrictions Precautions Precautions: Fall Restrictions Weight Bearing Restrictions: No      Mobility  Bed Mobility Overal bed mobility: Needs Assistance Bed Mobility: Supine to Sit     Supine to sit: Mod assist     General bed mobility comments: slow labored movement with much verbal/tactile cueing  Transfers Overall transfer level: Needs assistance Equipment used: Rolling walker (2 wheeled) Transfers: Sit to/from [facility] Sit to Stand: Min assist Stand pivot transfers: Min assist;Mod assist       General transfer comment: slow labored movement  Ambulation/Gait Ambulation/Gait assistance: Min assist Gait Distance (Feet): 55 Feet Assistive device: Rolling walker (2 wheeled) Gait Pattern/deviations: Decreased step length - right;Decreased step length - left;Decreased stride length Gait velocity: decreased   General Gait Details: labored slow cadence without  loss of balance, once fatigued his balance decreased with increased reliance on BUE to maintain standing balance  Stairs            Wheelchair Mobility    Modified Rankin (Stroke Patients Only)        Balance Overall balance assessment: Needs assistance Sitting-balance support: Feet unsupported;No upper extremity supported Sitting balance-Leahy Scale: Fair Sitting balance - Comments: fair/good seated at bedside   Standing balance support: During functional activity;Bilateral upper extremity supported Standing balance-Leahy Scale: Fair Standing balance comment: using RW                             Pertinent Vitals/Pain Pain Assessment: No/denies pain    Home Living Family/patient expects to be discharged to:: Private residence Living Arrangements: Spouse/significant other               Additional Comments: unable to obtain info from patient    Prior Function Level of Independence: Needs assistance   Gait / Transfers Assistance Needed: household ambulator  ADL's / Homemaking Assistance Needed: assisted by spouse        Hand Dominance        Extremity/Trunk Assessment   Upper Extremity Assessment Upper Extremity Assessment: Generalized weakness    Lower Extremity Assessment Lower Extremity Assessment: Generalized weakness    Cervical / Trunk Assessment Cervical / Trunk Assessment: Normal  Communication   Communication: Other (comment)(mostly nonverbal, occasional answers questions)  Cognition Arousal/Alertness: Awake/alert Behavior During Therapy: WFL for tasks assessed/performed;Flat affect Overall Cognitive Status: History of cognitive impairments - at baseline                                        General Comments      Exercises     Assessment/Plan    PT Assessment Patient needs continued PT services  PT Problem List Decreased strength;Decreased activity tolerance;Decreased balance;Decreased mobility       PT Treatment Interventions Gait training;Functional mobility training;Therapeutic activities;Therapeutic exercise;Patient/family education    PT Goals (Current goals can be found in the Care Plan section)   Acute Rehab PT Goals Patient Stated Goal: not stated PT Goal Formulation: With patient Time For Goal Achievement: 08/11/19 Potential to Achieve Goals: Good    Frequency Min 3X/week   Barriers to discharge        Co-evaluation               AM-PAC PT "6 Clicks" Mobility  Outcome Measure Help needed turning from your back to your side while in a flat bed without using bedrails?: A Lot Help needed moving from lying on your back to sitting on the side of a flat bed without using bedrails?: A Lot Help needed moving to and from a bed to a chair (including a wheelchair)?: A Lot Help needed standing up from a chair using your arms (e.g., wheelchair or bedside chair)?: A Lot Help needed to walk in hospital room?: A Lot Help needed climbing 3-5 steps with a railing? : Total 6 Click Score: 11    End of Session Equipment Utilized During Treatment: Gait belt Activity Tolerance: Patient tolerated treatment well;Patient limited by fatigue Patient left: in chair;with call bell/phone within reach;with chair alarm set Nurse Communication: Mobility status PT Visit Diagnosis: Unsteadiness on feet (R26.81);Other abnormalities of gait and mobility (R26.89);Muscle weakness (generalized) (M62.81)    Time:  7412-8786 PT Time Calculation (min) (ACUTE ONLY): 35 min   Charges:   PT Evaluation $PT Eval Moderate Complexity: 1 Mod PT Treatments $Therapeutic Activity: 23-37 mins        2:08 PM, 07/28/19 Lonell Grandchild, MPT Physical Therapist with [facility] 336 [phone] office [phone] mobile phone

## 2019-07-28 NOTE — Progress Notes (Signed)
Report called to [facility] in [place], given to Cruz Condon, LPN. CM aware to call EMS. IV removed. Will continue to monitor.

## 2019-07-28 NOTE — Plan of Care (Signed)
  Problem: Acute Rehab PT Goals(only PT should resolve) Goal: Pt Will Go Supine/Side To Sit Outcome: Progressing Flowsheets (Taken 07/28/2019 1410) Pt will go Supine/Side to Sit:  with minimal assist  with min guard assist Goal: Patient Will Transfer Sit To/From Stand Outcome: Progressing Flowsheets (Taken 07/28/2019 1410) Patient will transfer sit to/from stand:  with min guard assist  with minimal assist Goal: Pt Will Transfer Bed To Chair/Chair To Bed Outcome: Progressing Flowsheets (Taken 07/28/2019 1410) Pt will Transfer Bed to Chair/Chair to Bed:  min guard assist  with min assist Goal: Pt Will Ambulate Outcome: Progressing Flowsheets (Taken 07/28/2019 1410) Pt will Ambulate:  75 feet  with min guard assist  with rolling walker   2:10 PM, 07/28/19 Lonell Grandchild, MPT Physical Therapist with [facility] [phone] office [phone] mobile phone

## 2019-07-29 DIAGNOSIS — Z7401 Bed confinement status: Secondary | ICD-10-CM | POA: Diagnosis not present

## 2019-07-29 DIAGNOSIS — I10 Essential (primary) hypertension: Secondary | ICD-10-CM | POA: Diagnosis not present

## 2019-07-29 LAB — CBC WITH DIFFERENTIAL/PLATELET
Abs Immature Granulocytes: 0.01 10*3/uL (ref 0.00–0.07)
Basophils Absolute: 0 10*3/uL (ref 0.0–0.1)
Basophils Relative: 1 %
Eosinophils Absolute: 0.1 10*3/uL (ref 0.0–0.5)
Eosinophils Relative: 1 %
HCT: 42.2 % (ref 39.0–52.0)
Hemoglobin: 13.9 g/dL (ref 13.0–17.0)
Immature Granulocytes: 0 %
Lymphocytes Relative: 23 %
Lymphs Abs: 1.2 10*3/uL (ref 0.7–4.0)
MCH: 28.7 pg (ref 26.0–34.0)
MCHC: 32.9 g/dL (ref 30.0–36.0)
MCV: 87 fL (ref 80.0–100.0)
Monocytes Absolute: 0.9 10*3/uL (ref 0.1–1.0)
Monocytes Relative: 17 %
Neutro Abs: 3.1 10*3/uL (ref 1.7–7.7)
Neutrophils Relative %: 58 %
Platelets: 236 10*3/uL (ref 150–400)
RBC: 4.85 MIL/uL (ref 4.22–5.81)
RDW: 16.7 % — ABNORMAL HIGH (ref 11.5–15.5)
WBC: 5.4 10*3/uL (ref 4.0–10.5)
nRBC: 0 % (ref 0.0–0.2)

## 2019-07-29 LAB — URINALYSIS, ROUTINE W REFLEX MICROSCOPIC
Bacteria, UA: NONE SEEN
Bilirubin Urine: NEGATIVE
Glucose, UA: NEGATIVE mg/dL
Ketones, ur: NEGATIVE mg/dL
Leukocytes,Ua: NEGATIVE
Nitrite: NEGATIVE
Protein, ur: NEGATIVE mg/dL
Specific Gravity, Urine: 1.03 (ref 1.005–1.030)
pH: 5 (ref 5.0–8.0)

## 2019-07-29 LAB — COMPREHENSIVE METABOLIC PANEL
ALT: 19 U/L (ref 0–44)
AST: 23 U/L (ref 15–41)
Albumin: 3.5 g/dL (ref 3.5–5.0)
Alkaline Phosphatase: 89 U/L (ref 38–126)
Anion gap: 10 (ref 5–15)
BUN: 23 mg/dL (ref 8–23)
CO2: 24 mmol/L (ref 22–32)
Calcium: 9 mg/dL (ref 8.9–10.3)
Chloride: 102 mmol/L (ref 98–111)
Creatinine, Ser: 1.13 mg/dL (ref 0.61–1.24)
GFR calc Af Amer: >60 mL/min (ref >60)
GFR calc non Af Amer: >60 mL/min (ref >60)
Glucose, Bld: 120 mg/dL — ABNORMAL HIGH (ref 70–99)
Potassium: 4.2 mmol/L (ref 3.5–5.1)
Sodium: 136 mmol/L (ref 135–145)
Total Bilirubin: 0.7 mg/dL (ref 0.3–1.2)
Total Protein: 7.3 g/dL (ref 6.5–8.1)

## 2019-07-29 LAB — ETHANOL: Alcohol, Ethyl (B): <10 mg/dL (ref <10)

## 2019-07-29 LAB — RAPID URINE DRUG SCREEN, HOSP PERFORMED
Amphetamines: NOT DETECTED
Barbiturates: NOT DETECTED
Benzodiazepines: POSITIVE — AB
Cocaine: NOT DETECTED
Opiates: NOT DETECTED
Tetrahydrocannabinol: NOT DETECTED

## 2019-07-29 LAB — ACETAMINOPHEN LEVEL: Acetaminophen (Tylenol), Serum: <10 ug/mL — ABNORMAL LOW (ref 10–30)

## 2019-07-29 LAB — CBG MONITORING, ED: Glucose-Capillary: 137 mg/dL — ABNORMAL HIGH (ref 70–99)

## 2019-07-29 LAB — SALICYLATE LEVEL: Salicylate Lvl: <7 mg/dL (ref 2.8–30.0)

## 2019-07-29 LAB — SARS CORONAVIRUS 2 BY RT PCR (HOSPITAL ORDER, PERFORMED IN ~~LOC~~ HOSPITAL LAB): SARS Coronavirus 2: NEGATIVE

## 2019-07-29 LAB — VALPROIC ACID LEVEL: Valproic Acid Lvl: 39 ug/mL — ABNORMAL LOW (ref 50.0–100.0)

## 2019-07-29 LAB — AMMONIA: Ammonia: 20 umol/L (ref 9–35)

## 2019-07-29 MED ORDER — ZIPRASIDONE MESYLATE 20 MG IM SOLR: 20.0000 mg | INTRAMUSCULAR | Status: DC | PRN

## 2019-07-29 MED ORDER — RISPERIDONE 1 MG PO TBDP: 2.0000 mg | ORAL_TABLET | Freq: Three times a day (TID) | ORAL | Status: DC | PRN

## 2019-07-29 MED ORDER — LORAZEPAM 1 MG PO TABS: 1.0000 mg | ORAL_TABLET | ORAL | Status: DC | PRN

## 2019-07-29 NOTE — ED Notes (Signed)
EMS called for transport.

## 2019-07-29 NOTE — ED Notes (Signed)
TTS responded- pt did not answer, counselor reports she will call pt spouse for further information. This nurse informed counselor that pt was admitted to  [facility] of [place] today and has a history of dementia with behavioral disturbances.

## 2019-07-29 NOTE — Progress Notes (Signed)
Per Anette Riedel, NP pt does not meet criteria for inpt tx. Pt is recommended to f/u with his current nursing home facility. EDP Pollina, Gwenyth Allegra, MD and pt's nurse Angela Burke, RN have been advised.  Lind Covert, MSW, LCSW Therapeutic Triage Specialist  [phone]

## 2019-07-29 NOTE — BH Assessment (Addendum)
Tele Assessment Note   Patient Name: Marvin [name] MRN: [id number] Referring Physician: Orpah Greek, MD Location of Patient: [facility] Location of Provider: [place] Department  STERLING MONDO is an 68 y.o. male who presents to the ED voluntarily. Pt does not respond to TTS during the assessment. Pt's nurse is present in the assessment room and she states the pt was recently d/c from the ED on 07/28/19 and transported to [facility] skilled nursing facility due to hx of dementia. Pt reportedly went to the nursing home and began displaying aggressive behaviors and became agitated at staff. ED staff states the pt has been appropriate and calm while in the ED. TTS attempted to contact the pt's wife but did not receive an answer. A HIPAA compliant v/m was left for the pt's wife.   Per Anette Riedel, NP pt does not meet criteria for inpt tx. Pt is recommended to f/u with his current nursing home facility. EDP Pollina, Gwenyth Allegra, MD and pt's nurse Angela Burke, RN have been advised.  Diagnosis: Mild neurocognitive disorder due to another medical condition   Past Medical History:  Past Medical History:  Diagnosis Date  . Degenerative joint disease   . Dementia ([place])   . Gout   . Hemorrhoids    Dr. Romona Curls  . Hyperlipidemia   . Hypertension   . Impaired glucose tolerance   . Premature atrial contractions     Past Surgical History:  Procedure Laterality Date  . COLONOSCOPY  07/2004  . COLONOSCOPY N/A 09/22/2014   Procedure: COLONOSCOPY;  Surgeon: Daneil Dolin, MD;  [facility];  Service: Endoscopy;  Laterality: N/A;  9:30 AM        PT REQUEST TIME  . KNEE ARTHROSCOPY     Right    Family History:  Family History  Problem Relation Age of Onset  . Alzheimer's disease Mother   . Diabetes Father   . Heart failure Father        prostate disease   . Alzheimer's disease Sister   . Kidney failure Brother   . Diabetes Brother   . High blood  pressure Brother   . Cancer Brother     Social History:  reports that he quit smoking about 28 years ago. He has never used smokeless tobacco. He reports that he does not drink alcohol or use drugs.  Additional Social History:  Alcohol / Drug Use Pain Medications: See MAR Prescriptions: See MAR Over the Counter: See MAR History of alcohol / drug use?: (UTA DUE TO AMS)  CIWA: CIWA-Ar BP: (!) 179/91 Pulse Rate: 85 COWS:    Allergies:  Allergies  Allergen Reactions  . Aspirin Other (See Comments)    Stomach upset  . Namenda [Memantine Hcl] Other (See Comments)    Increased dementia behavior issues  . Naprosyn [Naproxen] Hives  . Penicillins     Did it involve swelling of the face/tongue/throat, SOB, or low BP? Unknown Did it involve sudden or severe rash/hives, skin peeling, or any reaction on the inside of your mouth or nose? Unknown Did you need to seek medical attention at a hospital or doctor's office? Unknown When did it last happen?Unknown If all above answers are "NO", may proceed with cephalosporin use.     Home Medications: (Not in a hospital admission)   OB/GYN Status:  No LMP for male patient.  General Assessment Data Location of Assessment: [facility] TTS Assessment: In system Is this a Tele  or Face-to-Face Assessment?: Tele Assessment Is this an Initial Assessment or a Re-assessment for this encounter?: Initial Assessment Patient Accompanied by:: N/A Language Other than English: No Living Arrangements: In Assisted Living/Nursing Home (Comment: Name of [place]) What gender do you identify as?: Male Marital status: Married Pregnancy Status: No Living Arrangements: Other (Comment)([facility]) Can pt return to current living arrangement?: Yes Admission Status: Voluntary Is patient capable of signing voluntary admission?: No Referral Source: Other([facility]) Insurance type: [employer] [facility]     Crisis Care Plan Living Arrangements:  Other (Comment)([facility]) Name of Psychiatrist: UTA due to [place] Name of Therapist: UTA due to AMS  Education Status Is patient currently in school?: No Is the patient employed, unemployed or receiving disability?: Receiving disability income  Risk to self with the past 6 months Suicidal Ideation: No Has patient been a risk to self within the past 6 months prior to admission? : No Suicidal Intent: No Has patient had any suicidal intent within the past 6 months prior to admission? : No Is patient at risk for suicide?: No Suicidal Plan?: No Has patient had any suicidal plan within the past 6 months prior to admission? : No Access to Means: No What has been your use of drugs/alcohol within the last 12 months?: UTA due to AMS Previous Attempts/Gestures: No Triggers for Past Attempts: None known Intentional Self Injurious Behavior: None Family Suicide History: Unable to assess Recent stressful life event(s): Recent negative physical changes Persecutory voices/beliefs?: (UTA due to AMS) Depression: (UTA due to AMS) Substance abuse history and/or treatment for substance abuse?: (UTA due to AMS) Suicide prevention information given to non-admitted patients: Not applicable  Risk to Others within the past 6 months Homicidal Ideation: No Does patient have any lifetime risk of violence toward others beyond the six months prior to admission? : No Thoughts of Harm to Others: No Current Homicidal Intent: No Current Homicidal Plan: No Access to Homicidal Means: No History of harm to others?: No Assessment of Violence: None Noted Does patient have access to weapons?: No Criminal Charges Pending?: No Does patient have a court date: No Is patient on probation?: No  Psychosis Hallucinations: (UTA due to AMS) Delusions: (UTA due to AMS)  Mental Status Report Appearance/Hygiene: Unremarkable Eye Contact: Poor Motor Activity: Unsteady Speech: Unable to assess Level of Consciousness:  Unable to assess Mood: Other (Comment)(UTA due to AMS) Affect: Other (Comment)(UTA due to AMS) Anxiety Level: (UTA due to AMS) Thought Processes: Unable to Assess Judgement: Unable to Assess Orientation: Unable to assess Obsessive Compulsive Thoughts/Behaviors: Unable to Assess  Cognitive Functioning Concentration: Unable to Assess Memory: Unable to Assess Is patient IDD: (UTA due to AMS) Insight: Unable to Assess Impulse Control: Unable to Assess Appetite: (UTA due to AMS) Have you had any weight changes? : (UTA due to AMS) Sleep: Unable to Assess Total Hours of Sleep: (UTA due to AMS) Vegetative Symptoms: Unable to Assess  ADLScreening [facility] Assessment Services) Patient's cognitive ability adequate to safely complete daily activities?: No Patient able to express need for assistance with ADLs?: Yes Independently performs ADLs?: No  Prior Inpatient Therapy Prior Inpatient Therapy: (UTA due to AMS)  Prior Outpatient Therapy Prior Outpatient Therapy: (UTA due to AMS)  ADL Screening (condition at time of admission) Patient's cognitive ability adequate to safely complete daily activities?: No Is the patient deaf or have difficulty hearing?: Yes Does the patient have difficulty seeing, even when wearing glasses/contacts?: Yes Does the patient have difficulty concentrating, remembering, or making decisions?:  Yes Patient able to express need for assistance with ADLs?: Yes Does the patient have difficulty dressing or bathing?: Yes Independently performs ADLs?: No Communication: Independent Dressing (OT): Needs assistance Is this a change from baseline?: Pre-admission baseline Grooming: Needs assistance Is this a change from baseline?: Pre-admission baseline Feeding: Independent Bathing: Needs assistance Is this a change from baseline?: Pre-admission baseline Toileting: Needs assistance Is this a change from baseline?: Pre-admission baseline In/Out Bed: Needs assistance Is  this a change from baseline?: Pre-admission baseline Walks in Home: Needs assistance Is this a change from baseline?: Pre-admission baseline Does the patient have difficulty walking or climbing stairs?: Yes Weakness of Legs: Both Weakness of Arms/Hands: Both  Home Assistive Devices/Equipment Home Assistive Devices/Equipment: Walker (specify type)    Abuse/Neglect Assessment (Assessment to be complete while patient is alone) Abuse/Neglect Assessment Can Be Completed: Unable to assess, patient is non-responsive or altered mental status     Advance Directives (For Healthcare) Does Patient Have a Medical Advance Directive?: Unable to assess, patient is non-responsive or altered mental status Would patient like information on creating a medical advance directive?: No - Patient declined          Disposition: Per Anette Riedel, NP pt does not meet criteria for inpt tx. Pt is recommended to f/u with his current nursing home facility. EDP Pollina, Gwenyth Allegra, MD and pt's nurse Angela Burke, RN have been advised.  Disposition Initial Assessment Completed for this Encounter: Yes Disposition of Patient: Discharge Patient refused recommended treatment: No Mode of transportation if patient is discharged/movement?: Car  This service was provided via telemedicine using a 2-way, interactive audio and video technology.  Names of all persons participating in this telemedicine service and their role in this encounter. Name:  Marvin [name] Role: Patient  Name: Lind Covert Role: TTS          Lyanne Co 07/29/2019 2:20 AM

## 2019-07-29 NOTE — ED Notes (Signed)
Pt unable to sign at discharge d/t dementia

## 2019-07-29 NOTE — ED Notes (Signed)
[facility] called to inform pt does not meet in-patient criteria and these behaviors are more than likely related to his diagnosis of dementia and also being in a new environment, as today was his first day at the [facility]. Dr Betsey Holiday aware.

## 2019-07-29 NOTE — ED Notes (Addendum)
Pt sat up in bed, attempting to scoot down the stretcher. Pt redirected and given warm blanket, pt then laid back down and closed eyes.

## 2019-07-29 NOTE — ED Notes (Signed)
This nurse called [facility] in [place] to inquire about the history and events on pt that was transported via EMS to [facility] ED. This nurse spoke with Mervin Hack who reported that she had a Dr order to send pt to [facility] ED for mental evaluation and that she told EMS this three times prior to them leaving facility. Fax received from [facility] with written order stating to send pt to [facility] ED for mental evaluation. This order was shown to Hassan Rowan, Warehouse manager and Dr Betsey Holiday.

## 2019-07-30 DIAGNOSIS — R404 Transient alteration of awareness: Secondary | ICD-10-CM | POA: Diagnosis not present

## 2019-07-30 DIAGNOSIS — R0902 Hypoxemia: Secondary | ICD-10-CM | POA: Diagnosis not present

## 2019-07-30 DIAGNOSIS — Z880 Allergy status to penicillin: Secondary | ICD-10-CM | POA: Diagnosis not present

## 2019-07-30 DIAGNOSIS — I10 Essential (primary) hypertension: Secondary | ICD-10-CM | POA: Diagnosis not present

## 2019-07-30 DIAGNOSIS — R4182 Altered mental status, unspecified: Secondary | ICD-10-CM | POA: Diagnosis not present

## 2019-07-30 DIAGNOSIS — Z888 Allergy status to other drugs, medicaments and biological substances status: Secondary | ICD-10-CM | POA: Diagnosis not present

## 2019-07-30 DIAGNOSIS — R69 Illness, unspecified: Secondary | ICD-10-CM | POA: Diagnosis not present

## 2019-07-30 DIAGNOSIS — R001 Bradycardia, unspecified: Secondary | ICD-10-CM | POA: Diagnosis not present

## 2019-07-30 DIAGNOSIS — R410 Disorientation, unspecified: Secondary | ICD-10-CM | POA: Diagnosis not present

## 2019-07-31 DIAGNOSIS — I10 Essential (primary) hypertension: Secondary | ICD-10-CM | POA: Diagnosis not present

## 2019-07-31 DIAGNOSIS — R609 Edema, unspecified: Secondary | ICD-10-CM | POA: Diagnosis not present

## 2019-07-31 DIAGNOSIS — M109 Gout, unspecified: Secondary | ICD-10-CM | POA: Diagnosis not present

## 2019-07-31 DIAGNOSIS — E785 Hyperlipidemia, unspecified: Secondary | ICD-10-CM | POA: Diagnosis not present

## 2019-08-02 DIAGNOSIS — R69 Illness, unspecified: Secondary | ICD-10-CM | POA: Diagnosis not present

## 2019-08-03 ENCOUNTER — Telehealth: Payer: Self-pay | Admitting: *Deleted

## 2019-08-03 DIAGNOSIS — D649 Anemia, unspecified: Secondary | ICD-10-CM | POA: Diagnosis not present

## 2019-08-03 DIAGNOSIS — M109 Gout, unspecified: Secondary | ICD-10-CM | POA: Diagnosis not present

## 2019-08-03 DIAGNOSIS — I10 Essential (primary) hypertension: Secondary | ICD-10-CM | POA: Diagnosis not present

## 2019-08-03 DIAGNOSIS — E785 Hyperlipidemia, unspecified: Secondary | ICD-10-CM | POA: Diagnosis not present

## 2019-08-03 NOTE — Telephone Encounter (Signed)
Thankful place,ment is happening, pls let her know. Also, on 07/29/2019, normal blood count , kidney and liver function, excellent results

## 2019-08-03 NOTE — Telephone Encounter (Signed)
Spoke with pts wife Olin Hauser (on [place]) he is now in [facility] for rehab but he is going to do his 20 days and they are working on getting him long term care. Just wanted to let Dr. Moshe Cipro know and she also wanted to know the results from his blood work from when he was in [place]. As no one at [facility] would give her a straight answer

## 2019-08-04 ENCOUNTER — Ambulatory Visit: Payer: Medicare HMO | Admitting: Family Medicine

## 2019-08-04 DIAGNOSIS — Z79899 Other long term (current) drug therapy: Secondary | ICD-10-CM | POA: Diagnosis not present

## 2019-08-04 DIAGNOSIS — D649 Anemia, unspecified: Secondary | ICD-10-CM | POA: Diagnosis not present

## 2019-08-04 NOTE — Telephone Encounter (Signed)
Pam aware.

## 2019-08-05 DIAGNOSIS — E875 Hyperkalemia: Secondary | ICD-10-CM | POA: Diagnosis not present

## 2019-08-05 DIAGNOSIS — E785 Hyperlipidemia, unspecified: Secondary | ICD-10-CM | POA: Diagnosis not present

## 2019-08-05 DIAGNOSIS — M109 Gout, unspecified: Secondary | ICD-10-CM | POA: Diagnosis not present

## 2019-08-05 DIAGNOSIS — I10 Essential (primary) hypertension: Secondary | ICD-10-CM | POA: Diagnosis not present

## 2019-08-08 DIAGNOSIS — R451 Restlessness and agitation: Secondary | ICD-10-CM | POA: Diagnosis not present

## 2019-08-08 DIAGNOSIS — R69 Illness, unspecified: Secondary | ICD-10-CM | POA: Diagnosis not present

## 2019-08-30 DIAGNOSIS — Z20828 Contact with and (suspected) exposure to other viral communicable diseases: Secondary | ICD-10-CM | POA: Diagnosis not present

## 2019-09-01 DIAGNOSIS — E559 Vitamin D deficiency, unspecified: Secondary | ICD-10-CM | POA: Diagnosis not present

## 2019-09-01 DIAGNOSIS — I10 Essential (primary) hypertension: Secondary | ICD-10-CM | POA: Diagnosis not present

## 2019-09-01 DIAGNOSIS — E785 Hyperlipidemia, unspecified: Secondary | ICD-10-CM | POA: Diagnosis not present

## 2019-09-05 DIAGNOSIS — Z20828 Contact with and (suspected) exposure to other viral communicable diseases: Secondary | ICD-10-CM | POA: Diagnosis not present

## 2019-09-19 DIAGNOSIS — R69 Illness, unspecified: Secondary | ICD-10-CM | POA: Diagnosis not present

## 2019-09-19 DIAGNOSIS — R451 Restlessness and agitation: Secondary | ICD-10-CM | POA: Diagnosis not present

## 2019-09-22 DIAGNOSIS — Z79899 Other long term (current) drug therapy: Secondary | ICD-10-CM | POA: Diagnosis not present

## 2019-09-22 DIAGNOSIS — I10 Essential (primary) hypertension: Secondary | ICD-10-CM | POA: Diagnosis not present

## 2019-09-22 DIAGNOSIS — E039 Hypothyroidism, unspecified: Secondary | ICD-10-CM | POA: Diagnosis not present

## 2019-09-22 DIAGNOSIS — D649 Anemia, unspecified: Secondary | ICD-10-CM | POA: Diagnosis not present

## 2019-09-22 DIAGNOSIS — M109 Gout, unspecified: Secondary | ICD-10-CM | POA: Diagnosis not present

## 2019-09-22 DIAGNOSIS — E785 Hyperlipidemia, unspecified: Secondary | ICD-10-CM | POA: Diagnosis not present

## 2019-09-22 DIAGNOSIS — E559 Vitamin D deficiency, unspecified: Secondary | ICD-10-CM | POA: Diagnosis not present

## 2019-09-22 DIAGNOSIS — R569 Unspecified convulsions: Secondary | ICD-10-CM | POA: Diagnosis not present

## 2019-09-24 ENCOUNTER — Emergency Department (HOSPITAL_COMMUNITY): Payer: Medicare HMO

## 2019-09-24 ENCOUNTER — Other Ambulatory Visit: Payer: Self-pay

## 2019-09-24 ENCOUNTER — Emergency Department (HOSPITAL_COMMUNITY)
Admission: EM | Admit: 2019-09-24 | Discharge: 2019-10-03 | Disposition: A | Discharge status: Other Acute Inpatient Hospital | Payer: Medicare HMO | Attending: Emergency Medicine | Admitting: Emergency Medicine

## 2019-09-24 ENCOUNTER — Encounter (HOSPITAL_COMMUNITY): Payer: Self-pay | Admitting: Emergency Medicine

## 2019-09-24 DIAGNOSIS — Z87891 Personal history of nicotine dependence: Secondary | ICD-10-CM | POA: Diagnosis not present

## 2019-09-24 DIAGNOSIS — F0390 Unspecified dementia without behavioral disturbance: Secondary | ICD-10-CM

## 2019-09-24 DIAGNOSIS — I10 Essential (primary) hypertension: Secondary | ICD-10-CM | POA: Diagnosis not present

## 2019-09-24 DIAGNOSIS — R4182 Altered mental status, unspecified: Secondary | ICD-10-CM | POA: Diagnosis not present

## 2019-09-24 DIAGNOSIS — Z20828 Contact with and (suspected) exposure to other viral communicable diseases: Secondary | ICD-10-CM | POA: Diagnosis not present

## 2019-09-24 DIAGNOSIS — F0391 Unspecified dementia with behavioral disturbance: Secondary | ICD-10-CM | POA: Diagnosis present

## 2019-09-24 DIAGNOSIS — F0151 Vascular dementia with behavioral disturbance: Secondary | ICD-10-CM | POA: Diagnosis not present

## 2019-09-24 DIAGNOSIS — I491 Atrial premature depolarization: Secondary | ICD-10-CM | POA: Diagnosis not present

## 2019-09-24 DIAGNOSIS — Z79899 Other long term (current) drug therapy: Secondary | ICD-10-CM | POA: Diagnosis not present

## 2019-09-24 DIAGNOSIS — R69 Illness, unspecified: Secondary | ICD-10-CM | POA: Diagnosis not present

## 2019-09-24 DIAGNOSIS — F03918 Unspecified dementia, unspecified severity, with other behavioral disturbance: Secondary | ICD-10-CM | POA: Diagnosis present

## 2019-09-24 LAB — CBC WITH DIFFERENTIAL/PLATELET
Abs Immature Granulocytes: 0.01 10*3/uL (ref 0.00–0.07)
Basophils Absolute: 0 10*3/uL (ref 0.0–0.1)
Basophils Relative: 0 %
Eosinophils Absolute: 0.1 10*3/uL (ref 0.0–0.5)
Eosinophils Relative: 1 %
HCT: 39.2 % (ref 39.0–52.0)
Hemoglobin: 12.8 g/dL — ABNORMAL LOW (ref 13.0–17.0)
Immature Granulocytes: 0 %
Lymphocytes Relative: 32 %
Lymphs Abs: 1.6 10*3/uL (ref 0.7–4.0)
MCH: 30.3 pg (ref 26.0–34.0)
MCHC: 32.7 g/dL (ref 30.0–36.0)
MCV: 92.7 fL (ref 80.0–100.0)
Monocytes Absolute: 0.5 10*3/uL (ref 0.1–1.0)
Monocytes Relative: 10 %
Neutro Abs: 2.9 10*3/uL (ref 1.7–7.7)
Neutrophils Relative %: 57 %
Platelets: 250 10*3/uL (ref 150–400)
RBC: 4.23 MIL/uL (ref 4.22–5.81)
RDW: 15.8 % — ABNORMAL HIGH (ref 11.5–15.5)
WBC: 5.1 10*3/uL (ref 4.0–10.5)
nRBC: 0 % (ref 0.0–0.2)

## 2019-09-24 LAB — BASIC METABOLIC PANEL
Anion gap: 9 (ref 5–15)
BUN: 19 mg/dL (ref 8–23)
CO2: 25 mmol/L (ref 22–32)
Calcium: 8.9 mg/dL (ref 8.9–10.3)
Chloride: 104 mmol/L (ref 98–111)
Creatinine, Ser: 1.07 mg/dL (ref 0.61–1.24)
GFR calc Af Amer: >60 mL/min (ref >60)
GFR calc non Af Amer: >60 mL/min (ref >60)
Glucose, Bld: 87 mg/dL (ref 70–99)
Potassium: 3.7 mmol/L (ref 3.5–5.1)
Sodium: 138 mmol/L (ref 135–145)

## 2019-09-24 LAB — URINALYSIS, ROUTINE W REFLEX MICROSCOPIC
Bilirubin Urine: NEGATIVE
Glucose, UA: NEGATIVE mg/dL
Hgb urine dipstick: NEGATIVE
Ketones, ur: NEGATIVE mg/dL
Leukocytes,Ua: NEGATIVE
Nitrite: NEGATIVE
Protein, ur: NEGATIVE mg/dL
Specific Gravity, Urine: 1.026 (ref 1.005–1.030)
pH: 5 (ref 5.0–8.0)

## 2019-09-24 LAB — VALPROIC ACID LEVEL: Valproic Acid Lvl: <10 ug/mL — ABNORMAL LOW (ref 50.0–100.0)

## 2019-09-24 MED ORDER — LORAZEPAM 2 MG/ML IJ SOLN
1.0000 mg | Freq: Once | INTRAMUSCULAR | Status: AC
  Administered 2019-09-24: 1 mg via INTRAMUSCULAR

## 2019-09-24 MED ORDER — LORAZEPAM 2 MG/ML IJ SOLN
INTRAMUSCULAR | Status: AC
  Filled 2019-09-24: qty 1

## 2019-09-24 NOTE — Discharge Instructions (Signed)
Take your usual prescriptions as previously directed.  Call your regular medical doctor on Monday to schedule a follow up appointment within the next 3 days.  Return to the Emergency Department immediately sooner if worsening.  ° °

## 2019-09-24 NOTE — ED Provider Notes (Signed)
[facility] EMERGENCY DEPARTMENT Provider Note   CSN: [id number] Arrival date & time: 09/24/19  1944     History   Chief Complaint Chief Complaint  Patient presents with  . Altered Mental Status    HPI Marvin [name] is a 68 y.o. male.     The history is provided by the patient, the nursing home and the EMS personnel. The history is limited by the condition of the patient (hx dementia).  Altered Mental Status Pt was seen at 2125.  Per EMS and NH report: [name] called EMS after pt "was aggressive to staff and another resident." EMS states pt has been calm/cooperative en route. Pt with significant hx of dementia and currently denies any complaints.   Past Medical History:  Diagnosis Date  . Degenerative joint disease   . Dementia ([place])   . Gout   . Hemorrhoids    Dr. Romona Curls  . Hyperlipidemia   . Hypertension   . Impaired glucose tolerance   . Premature atrial contractions     Patient Active Problem List   Diagnosis Date Noted  . Hypoglycemia 07/24/2019  . Cerebral atrophy ([place]) 07/24/2019  . Thrombocytopenia ([place]) 07/24/2019  . Venous insufficiency of both lower extremities 07/24/2019  . 2+ pitting edema BLE 07/24/2019  . Hospital discharge follow-up 06/26/2019  . Dementia ([place]) 06/26/2019  . Dementia with behavioral problem ([place]) 06/26/2019  . Prediabetes 04/13/2018  . Hyperuricemia 04/13/2018  . Memory loss 01/12/2017  . Metabolic syndrome X 123456  . Obesity (BMI 30.0-34.9) 06/26/2013  . Hypertension   . Hyperlipidemia   . Degenerative joint disease   . Premature atrial contractions   . IGT (impaired glucose tolerance) 02/07/2010    Past Surgical History:  Procedure Laterality Date  . COLONOSCOPY  07/2004  . COLONOSCOPY N/A 09/22/2014   Procedure: COLONOSCOPY;  Surgeon: Daneil Dolin, MD;  Location[facility];  Service: Endoscopy;  Laterality: N/A;  9:30 AM        PT REQUEST TIME  . KNEE ARTHROSCOPY     Right        Home  Medications    Prior to Admission medications   Medication Sig Start Date End Date Taking? Authorizing Provider  allopurinol (ZYLOPRIM) 300 MG tablet Take 300 mg by mouth at bedtime.   Yes [provider]  amLODipine (NORVASC) 10 MG tablet Take 10 mg by mouth daily.   Yes [provider]  atorvastatin (LIPITOR) 40 MG tablet Take 1 tablet by mouth once daily Patient taking differently: Take 40 mg by mouth daily.  07/05/19  Yes Fayrene Helper, MD  cholecalciferol (VITAMIN D3) 25 MCG (1000 UT) tablet Take 1,000 Units by mouth daily.   Yes [provider]  cloNIDine (CATAPRES) 0.2 MG tablet TAKE 1 TABLET BY MOUTH THREE TIMES DAILY Patient taking differently: Take 0.2 mg by mouth 3 (three) times daily.  03/18/19  Yes Fayrene Helper, MD  donepezil (ARICEPT) 10 MG tablet Take 1 tablet (10 mg total) by mouth at bedtime. 04/27/18  Yes Ward Givens, NP  furosemide (LASIX) 40 MG tablet Take 40 mg by mouth 2 (two) times daily.   Yes [provider]  LORazepam (ATIVAN) 0.5 MG tablet Take 0.5 mg by mouth stat.   Yes [provider]  LORazepam (ATIVAN) 1 MG tablet Take 0.5 tablets (0.5 mg total) by mouth daily. 07/28/19  Yes Tat, Shanon Brow, MD  mirtazapine (REMERON) 15 MG tablet Take 1 tablet (15 mg total) by  mouth at bedtime. Patient taking differently: Take 15 mg by mouth daily. In the afternoon 07/07/19  Yes Fayrene Helper, MD  polyethylene glycol (MIRALAX / GLYCOLAX) 17 g packet Take 17 g by mouth daily.   Yes [provider]  valproic acid (DEPAKENE) 250 MG/5ML SOLN solution Take 10 cc by mouth every 12 hours Patient taking differently: Take 500 mg by mouth every 12 (twelve) hours. For dementia with aggressive behaviors 07/07/19  Yes Fayrene Helper, MD  risperiDONE (RISPERDAL) 0.5 MG tablet Take 1 tablet (0.5 mg total) by mouth at bedtime. Patient not taking: Reported on 09/24/2019 07/07/19   Fayrene Helper, MD  risperiDONE (RISPERDAL)  1 MG tablet Take 1 tablet (1 mg total) by mouth at bedtime. Patient not taking: Reported on 09/24/2019 07/07/19   Fayrene Helper, MD  traZODone (DESYREL) 150 MG tablet Take 1 tablet (150 mg total) by mouth at bedtime. Patient not taking: Reported on 09/24/2019 07/08/19   Fayrene Helper, MD    Family History Family History  Problem Relation Age of Onset  . Alzheimer's disease Mother   . Diabetes Father   . Heart failure Father        prostate disease   . Alzheimer's disease Sister   . Kidney failure Brother   . Diabetes Brother   . High blood pressure Brother   . Cancer Brother     Social History Social History   Tobacco Use  . Smoking status: Former Smoker    Quit date: 1992    Years since quitting: 28.7  . Smokeless tobacco: Never Used  Substance Use Topics  . Alcohol use: No    Comment: Quit 1992  . Drug use: No     Allergies   Aspirin, Namenda [memantine hcl], Naprosyn [naproxen], and Penicillins   Review of Systems Review of Systems  Unable to perform ROS: Dementia     Physical Exam Updated Vital Signs BP (!) 158/74   Pulse 61   Temp 97.8 F (36.6 C)   Resp 17   Ht 5\' 9"  (1.753 m)   Wt 102 kg   SpO2 100%   BMI 33.21 kg/m   Physical Exam 2130: Physical examination:  Nursing notes reviewed; Vital signs and O2 SAT reviewed;  Constitutional: Well developed, Well nourished, Well hydrated, In no acute distress; Head:  Normocephalic, atraumatic; Eyes: EOMI, PERRL, No scleral icterus; ENMT: Mouth and pharynx normal, Mucous membranes moist; Neck: Supple, Full range of motion, No lymphadenopathy; Cardiovascular: Regular rate and rhythm, No gallop; Respiratory: Breath sounds clear & equal bilaterally, No wheezes.  Speaking full sentences with ease, Normal respiratory effort/excursion; Chest: Nontender, Movement normal; Abdomen: Soft, Nontender, Nondistended, Normal bowel sounds; Genitourinary: No CVA tenderness; Extremities: Peripheral pulses normal, No  tenderness, No edema, No calf edema or asymmetry.; Neuro: Awake, alert, confused per hx dementia. No facial droop. Speech clear. No gross focal motor deficits in extremities. Climbs on and off stretcher easily by himself. Gait steady..; Skin: Color normal, Warm, Dry.   ED Treatments / Results  Labs (all labs ordered are listed, but only abnormal results are displayed)   EKG None  Radiology   Procedures Procedures (including critical care time)  Medications Ordered in ED Medications  LORazepam (ATIVAN) injection 1 mg (1 mg Intramuscular Given 09/24/19 2255)     Initial Impression / Assessment and Plan / ED Course  I have reviewed the triage vital signs and the nursing notes.  Pertinent labs & imaging results that were available  during my care of the patient were reviewed by me and considered in my medical decision making (see chart for details).     MDM Reviewed: previous chart, nursing note and vitals Reviewed previous: labs Interpretation: labs and x-ray   Results for orders placed or performed during the hospital encounter of 123456  Basic metabolic panel  Result Value Ref Range   Sodium 138 135 - 145 mmol/L   Potassium 3.7 3.5 - 5.1 mmol/L   Chloride 104 98 - 111 mmol/L   CO2 25 22 - 32 mmol/L   Glucose, Bld 87 70 - 99 mg/dL   BUN 19 8 - 23 mg/dL   Creatinine, Ser 1.07 0.61 - 1.24 mg/dL   Calcium 8.9 8.9 - 10.3 mg/dL   GFR calc non Af Amer >60 >60 mL/min   GFR calc Af Amer >60 >60 mL/min   Anion gap 9 5 - 15  CBC with Differential  Result Value Ref Range   WBC 5.1 4.0 - 10.5 K/uL   RBC 4.23 4.22 - 5.81 MIL/uL   Hemoglobin 12.8 (L) 13.0 - 17.0 g/dL   HCT 39.2 39.0 - 52.0 %   MCV 92.7 80.0 - 100.0 fL   MCH 30.3 26.0 - 34.0 pg   MCHC 32.7 30.0 - 36.0 g/dL   RDW 15.8 (H) 11.5 - 15.5 %   Platelets 250 150 - 400 K/uL   nRBC 0.0 0.0 - 0.2 %   Neutrophils Relative % 57 %   Neutro Abs 2.9 1.7 - 7.7 K/uL   Lymphocytes Relative 32 %   Lymphs Abs 1.6 0.7 -  4.0 K/uL   Monocytes Relative 10 %   Monocytes Absolute 0.5 0.1 - 1.0 K/uL   Eosinophils Relative 1 %   Eosinophils Absolute 0.1 0.0 - 0.5 K/uL   Basophils Relative 0 %   Basophils Absolute 0.0 0.0 - 0.1 K/uL   Immature Granulocytes 0 %   Abs Immature Granulocytes 0.01 0.00 - 0.07 K/uL  Urinalysis, Routine w reflex microscopic  Result Value Ref Range   Color, Urine YELLOW YELLOW   APPearance CLEAR CLEAR   Specific Gravity, Urine 1.026 1.005 - 1.030   pH 5.0 5.0 - 8.0   Glucose, UA NEGATIVE NEGATIVE mg/dL   Hgb urine dipstick NEGATIVE NEGATIVE   Bilirubin Urine NEGATIVE NEGATIVE   Ketones, ur NEGATIVE NEGATIVE mg/dL   Protein, ur NEGATIVE NEGATIVE mg/dL   Nitrite NEGATIVE NEGATIVE   Leukocytes,Ua NEGATIVE NEGATIVE  Valproic acid level  Result Value Ref Range   Valproic Acid Lvl <10 (L) 50.0 - 100.0 ug/mL   Dg Chest 2 View Result Date: 09/24/2019 CLINICAL DATA:  Increased AMS. EXAM: CHEST - 2 VIEW COMPARISON:  July 24, 2019 FINDINGS: Cardiomediastinal silhouette is normal. Mediastinal contours appear intact. There is no evidence of focal airspace consolidation, pleural effusion or pneumothorax. Osseous structures are without acute abnormality. Soft tissues are grossly normal. IMPRESSION: No active cardiopulmonary disease. Electronically Signed   By: Fidela Salisbury M.D.   On: 09/24/2019 22:10    Marvin [name] was evaluated in Emergency Department on 09/24/2019 for the symptoms described in the history of present illness. He was evaluated in the context of the global COVID-19 pandemic, which necessitated consideration that the patient might be at risk for infection with the SARS-CoV-2 virus that causes COVID-19. Institutional protocols and algorithms that pertain to the evaluation of patients at risk for COVID-19 are in a state of rapid change based on information released by regulatory bodies including  the [employer] and federal and state organizations. These policies and algorithms were  followed during the patient's care in the ED.   2315:  Pt has remained calm/cooperative his entire ED visit. Pt now is wanting to walk around the ED, not stay in his room. Pt is redirectable however; but then again walks out of his room and down the halls. PO ativan to be given and pt's wife to come to ED to help keep pt in his exam room until transport back to facility. Pt's depakote level subtherapeutic; question if pt is being given his meds, leading to his behaviors.  No clear indication for admission or psych eval at this time, though now NH states they will not take him back. Charge RN to call NH for clarification. Pt continues stable.     Final Clinical Impressions(s) / ED Diagnoses   Final diagnoses:  None    ED Discharge Orders    None       Francine Graven, DO 09/28/19 2330

## 2019-09-24 NOTE — ED Notes (Signed)
Pam, pt spouse called this nurse to check on pt. She said [facility] was supposed to call her before they took him anywhere. Pam asked how long pt has been in the ED. This nurse informed her pt had been here for 3 hours and 30 minutes. Spouse asked could she come and see pt- this nurse explained yes she could come see pt and we can talk more when she arrives.

## 2019-09-24 NOTE — ED Notes (Addendum)
This nurse called to [facility] in [place] and spoke with Earlie Server, LPN who reports pt "is not allowed to come back to facility"  per her Daleen Bo. This nurse requested MAR to be faxed to evaluate what medications have been given or not given this evening prior to pt arrival.

## 2019-09-24 NOTE — ED Triage Notes (Addendum)
Pt from [facility] home after staff called stating pt was being aggressive to staff and another resident. Pt with hx of dementia.

## 2019-09-24 NOTE — ED Notes (Signed)
Pt given phone to talk with Pam, pt spouse-pt and spouse talked for approx 2 minutes, then pt was escorted back to room and reminded that his wife was coming to see him and he needed to wait in his room for her. Security remains at bedside at this time.

## 2019-09-24 NOTE — ED Notes (Signed)
TV turned on for Marvin [name]- Marvin [name] is watching television at this time, turning channels with call bell. Marvin [name] knows his name, otherwise disoriented to time, place, and situation. Marvin [name] moved to room 18 and is in direct view of nurses station

## 2019-09-24 NOTE — ED Notes (Signed)
Pt walked out of room, trying to walk out of EMS door, pt is not redirectable at this time. Pt gets aggravated and jerks away when anyone puts hands on him, while attempting to get pt back in room. After several attempts pt back in room sitting on bed. Security is present. Dr Thurnell Garbe at bedside talking with pt- new orders for medication received.

## 2019-09-25 MED ORDER — AMLODIPINE BESYLATE 5 MG PO TABS
10.0000 mg | ORAL_TABLET | Freq: Every day | ORAL | Status: DC
  Administered 2019-09-25 – 2019-10-02 (×8): 10 mg via ORAL
  Filled 2019-09-25 (×10): qty 2

## 2019-09-25 MED ORDER — FUROSEMIDE 40 MG PO TABS
40.0000 mg | ORAL_TABLET | Freq: Two times a day (BID) | ORAL | Status: DC
  Administered 2019-09-25 – 2019-10-03 (×17): 40 mg via ORAL
  Filled 2019-09-25 (×17): qty 1

## 2019-09-25 MED ORDER — MIRTAZAPINE 15 MG PO TABS
15.0000 mg | ORAL_TABLET | Freq: Every day | ORAL | Status: DC
  Administered 2019-09-25 – 2019-10-02 (×9): 15 mg via ORAL
  Filled 2019-09-25 (×9): qty 1

## 2019-09-25 MED ORDER — DONEPEZIL HCL 5 MG PO TABS
10.0000 mg | ORAL_TABLET | Freq: Every day | ORAL | Status: DC
  Administered 2019-09-25 – 2019-10-02 (×9): 10 mg via ORAL
  Filled 2019-09-25: qty 1
  Filled 2019-09-25 (×5): qty 2
  Filled 2019-09-25: qty 1
  Filled 2019-09-25 (×7): qty 2

## 2019-09-25 MED ORDER — CLONIDINE HCL 0.2 MG PO TABS
0.2000 mg | ORAL_TABLET | Freq: Three times a day (TID) | ORAL | Status: DC
  Administered 2019-09-25 – 2019-10-03 (×24): 0.2 mg via ORAL
  Filled 2019-09-25 (×26): qty 1

## 2019-09-25 MED ORDER — ALLOPURINOL 300 MG PO TABS
300.0000 mg | ORAL_TABLET | Freq: Every day | ORAL | Status: DC
  Administered 2019-09-25 – 2019-10-02 (×8): 300 mg via ORAL
  Filled 2019-09-25 (×14): qty 1

## 2019-09-25 MED ORDER — RISPERIDONE 1 MG PO TABS
0.5000 mg | ORAL_TABLET | Freq: Every day | ORAL | Status: DC
  Administered 2019-09-25 – 2019-10-02 (×8): 0.5 mg via ORAL
  Filled 2019-09-25 (×8): qty 1

## 2019-09-25 MED ORDER — TRAZODONE HCL 50 MG PO TABS
150.0000 mg | ORAL_TABLET | Freq: Every day | ORAL | Status: DC
  Administered 2019-09-25 – 2019-10-02 (×9): 150 mg via ORAL
  Filled 2019-09-25 (×9): qty 3

## 2019-09-25 MED ORDER — VALPROATE SODIUM 250 MG/5ML PO SOLN
500.0000 mg | Freq: Two times a day (BID) | ORAL | Status: DC
  Administered 2019-09-25 – 2019-10-01 (×15): 500 mg via ORAL
  Filled 2019-09-25 (×20): qty 10

## 2019-09-25 MED ORDER — VALPROIC ACID 250 MG/5ML PO SOLN
ORAL | Status: AC
  Filled 2019-09-25: qty 10

## 2019-09-25 MED ORDER — POLYETHYLENE GLYCOL 3350 17 G PO PACK
17.0000 g | PACK | Freq: Every day | ORAL | Status: DC
  Administered 2019-09-25 – 2019-10-03 (×9): 17 g via ORAL
  Filled 2019-09-25 (×8): qty 1

## 2019-09-25 MED ORDER — RISPERIDONE 1 MG PO TABS
1.0000 mg | ORAL_TABLET | Freq: Every day | ORAL | Status: DC
  Administered 2019-09-25 – 2019-10-02 (×8): 1 mg via ORAL
  Filled 2019-09-25 (×8): qty 1

## 2019-09-25 MED ORDER — LORAZEPAM 0.5 MG PO TABS
0.5000 mg | ORAL_TABLET | Freq: Every day | ORAL | Status: DC
  Administered 2019-09-25 – 2019-10-03 (×9): 0.5 mg via ORAL
  Filled 2019-09-25 (×9): qty 1

## 2019-09-25 MED ORDER — VITAMIN D3 25 MCG (1000 UNIT) PO TABS
1000.0000 [IU] | ORAL_TABLET | Freq: Every day | ORAL | Status: DC
  Administered 2019-09-27 – 2019-10-03 (×5): 1000 [IU] via ORAL
  Filled 2019-09-25 (×11): qty 1

## 2019-09-25 MED ORDER — ATORVASTATIN CALCIUM 40 MG PO TABS
40.0000 mg | ORAL_TABLET | Freq: Every day | ORAL | Status: DC
  Administered 2019-09-25 – 2019-10-03 (×9): 40 mg via ORAL
  Filled 2019-09-25 (×9): qty 1

## 2019-09-25 NOTE — ED Notes (Signed)
Pt repeatedly offered meal   Kept turning head and would not eat  Spouse at bedside   Awaiting bed bath - )fred will bathe)

## 2019-09-25 NOTE — ED Notes (Signed)
Bathed pt, changed diaper, and got pt up to chair.  Teeth brushed.  PT resting, eyes closed.  Linens changed on bed, room cleaned.

## 2019-09-25 NOTE — ED Notes (Signed)
MAR that was requested from  [facility] in [place] has not been received.
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
## 2019-09-25 NOTE — ED Notes (Signed)
Ford, from [facility] called to inform pt meets criteria for geri-psych DR Tomi Bamberger aware and talking with pt and spouse at this time.

## 2019-09-25 NOTE — ED Notes (Signed)
Pt ate all of sandwich and chips

## 2019-09-25 NOTE — ED Notes (Signed)
TTS in progress at this time- pt spouse present

## 2019-09-25 NOTE — ED Notes (Signed)
Pt ate all snacks given.

## 2019-09-25 NOTE — ED Notes (Signed)
Complete bath and linen change done.  Pt placed in geri-chair.

## 2019-09-25 NOTE — ED Notes (Signed)
Pt urinated in floor again, spouse reports pt unable to tell her in time to assist with urinal- assisted pt into bed, boxers removed, pt covered up with 2 blankets. Floor cleaned.

## 2019-09-25 NOTE — Progress Notes (Signed)
Per [facility], acuity is too high on unit today and they are unable to take referrals.     Netta Neat, MSW, LCSW Clinical Social Work

## 2019-09-25 NOTE — ED Notes (Signed)
Pt sleeping quietly in recliner

## 2019-09-25 NOTE — ED Notes (Signed)
Pt has finished eating large french fry provided by spouse.

## 2019-09-25 NOTE — ED Notes (Signed)
Pt took all meds available for spouse and nurse and is currently enjoying applesauce.  His wife speaks to him very gently and he responds to her request  He appears comfortable in the recliner and has had a good day

## 2019-09-25 NOTE — ED Notes (Addendum)
Pt spouse brought [employer] food for pt and cheetos which pt has already finished. Pt spouse updated on plan of care and aware that TTS has been ordered for pt.

## 2019-09-25 NOTE — ED Notes (Signed)
Marvin [name], pt spouse on telephone speaking with pt at this time.

## 2019-09-25 NOTE — Progress Notes (Signed)
Patient is being reviewed by [facility].     Netta Neat, MSW, LCSW Clinical Social Work

## 2019-09-25 NOTE — ED Notes (Signed)
Sleeping in recliner

## 2019-09-25 NOTE — ED Notes (Addendum)
Pt spouse remains at bedside, pt eating hamburger provided by spouse.
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
## 2019-09-25 NOTE — ED Notes (Signed)
Spouse at bedside

## 2019-09-25 NOTE — ED Notes (Signed)
Pt continues to sleep in chair

## 2019-09-25 NOTE — ED Notes (Signed)
Pt has been sleeping since beginning of shift. Will open eyes and readjust in bed, but dozes back off. Attempted to wake for breakfast without success. Will continue to monitor.

## 2019-09-25 NOTE — ED Notes (Signed)
Family at bedside. 

## 2019-09-25 NOTE — ED Notes (Signed)
Pt given beef and cheddar snack pack, pt given one bag of gummy snack bag.

## 2019-09-25 NOTE — Progress Notes (Signed)
Gave clinical report to Lindon Romp, FNP who recommended Pt be admitted to a geriatric-psychiatry facility. TTS will contact appropriate facilities for placement. Notified Dr. Rolland Porter and Idelia Salm, RN of recommendation. Pt's wife is requesting [facility] because he has been a Pt there before.  CSW faxed referrals to the following:  [facility]   CSW will continue to assist with placement needs.   Netta Neat, MSW, LCSW Clinical Social Work

## 2019-09-25 NOTE — ED Notes (Signed)
Marvin [name], ED Sec calling TTS to inform them that pt spouse is present right now in the ED with pt and it would be beneficial for spouse to be included in evaluation as pt has dementia and is unable to provide information needed for evaluation.

## 2019-09-25 NOTE — ED Notes (Signed)
Recliner chair placed in pt room for spouse. Pt spouse was given blanket and pillow for comfort.

## 2019-09-25 NOTE — ED Notes (Signed)
Pt was given sandwich and chips with beverage.

## 2019-09-25 NOTE — ED Notes (Signed)
Pt given sandwich and chips and beverage

## 2019-09-25 NOTE — ED Notes (Signed)
Spouse called nurse to room- Pt urinated on floor by accident. Floor was cleaned, pt placed in gown, pt did not have pants on, only boxers present upon arrival to ED. These were removed by spouse. Clean urinal provided.

## 2019-09-25 NOTE — ED Notes (Signed)
Pt took medications, ate entire applesauce and whole banana. Drank cup of juice with miralax. Never opened his eyes during this but opened mouth when asked to.

## 2019-09-25 NOTE — ED Notes (Signed)
Barnett Applebaum, RN, [facility] aware of need for pt medicaitons

## 2019-09-26 LAB — URINE CULTURE: Culture: <10000 — AB

## 2019-09-26 NOTE — Progress Notes (Signed)
Pt also faxed out to [facility] for review.   Audree Camel, LCSW, [place] Disposition [facility] BHH/TTS [phone]

## 2019-09-26 NOTE — ED Notes (Signed)
Pt's wife leaving for morning. She has laid out clothes for him to be dressed in should he receive a bed at [facility]. She would also like to be notified when he receives a bed at [facility] so that she may come here and see him before he leaves.

## 2019-09-26 NOTE — ED Provider Notes (Signed)
Patient alert, content, nad.   Vital signs stable.   Awaiting inpatient psych placement, possibly to [facility] - SW/BH team continues to work on placement.      Lajean Saver, MD 09/26/19 1124

## 2019-09-27 MED ORDER — VALPROIC ACID 250 MG/5ML PO SOLN
ORAL | Status: AC
  Filled 2019-09-27: qty 10

## 2019-09-27 NOTE — BH Assessment (Signed)
[place] Assessment Progress Note    Patient was seen for re-assessment.  He appeared to be confused and difficult to direct.  He did not appear to have any understanding of what was being said to him and could not follow directions. He kept standing up and moving around and was looking around the room like he was seeing something.  Continued inpatient is recommended.

## 2019-09-27 NOTE — ED Notes (Signed)
Avasyst initiated.

## 2019-09-27 NOTE — ED Notes (Signed)
Ambulated to restroom with stand by assistance without difficulty

## 2019-09-27 NOTE — ED Notes (Signed)
Patient's family at bedside. Patient able to ambulate to the restroom. Patient does have brief.
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
## 2019-09-28 NOTE — ED Notes (Signed)
Pt hooked up to monitor because pulse ox heart rate was in 30s. EKG done, given to Dr. Roderic Palau. Pt heart rate on monitor in 60s. EDP notified.
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
## 2019-09-28 NOTE — ED Notes (Signed)
Patient resting in bed.  No noted distress.  Patient able to feed self after set up.  Patient did get up once and wandered in room and was able to be redirected to bed or bedside chair.  Patient answers some direct questions with verbal yes, no and thank you.

## 2019-09-28 NOTE — ED Notes (Signed)
Pt was wet through all the sheets,cleaned up and dry,pt resting family member at bedside.

## 2019-09-28 NOTE — ED Notes (Signed)
Pt ambulating with wife to bathroom to urinate.  Pt denies dizziness at this time.

## 2019-09-28 NOTE — ED Notes (Signed)
Pt Saturated in urine, brief full and overflowing onto sheets and bed. Pt changed and peri care given. Changed sheets, gown, and brief.

## 2019-09-28 NOTE — ED Notes (Signed)
Pt ambulating with wife to bathroom, with steady gait. Denies dizziness at this time.

## 2019-09-28 NOTE — ED Provider Notes (Signed)
This patient was reevaluated this morning, he is currently sleeping peacefully and according to the night staff he has had absolutely no complaints issues or aggression overnight.  The patient has had unremarkable vital signs without hypertension tachycardia or fever.  The lab work was reviewed showing no signs of urinary infection or abnormal blood tests, his home medications have been reconciled and psychiatry has been consulted.  Due to the patient's intermittent aggressive behavior he has been declined at some institutions but has been referred to multiple institutions.  Awaiting a formal placement recommendation   Noemi Chapel, MD 09/28/19 0700
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
## 2019-09-29 NOTE — ED Notes (Signed)
Patients brief checked, dry at this time.

## 2019-09-29 NOTE — ED Notes (Signed)
Pt ambulated to restroom without assistance.
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
## 2019-09-29 NOTE — ED Notes (Signed)
Have changed pt's brief.
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
## 2019-09-29 NOTE — ED Notes (Signed)
Pt is calm and cooperative. NAD. Have given pt meal tray. Is eating

## 2019-09-29 NOTE — ED Notes (Signed)
Have completely changed patients bed sheets, brief, and gown.

## 2019-09-29 NOTE — ED Notes (Signed)
Per Psych NP, Social work will call wife and get some information. After this, they will discuss possibly psych clearing patient to return home to to group home. Will call me back with more information.

## 2019-09-29 NOTE — ED Provider Notes (Signed)
The patient was seen and examined the morning of September 29, 2019 by myself.  The patient has had no events overnight, he continues to be calm, cooperative, he has had no aggressive outbursts in the last 24 hours and is essentially been very calm since arrival 4 days ago.  Recommendations as of yesterday were to continue with placement as the patient is not very interactive with the examiner when examined by telepsychiatry.  We will ask for repeat examination today to evaluate whether the patient still needs inpatient treatment.   Noemi Chapel, MD 09/29/19 [phone]

## 2019-09-29 NOTE — ED Notes (Signed)
Tele psych in room. 

## 2019-09-29 NOTE — ED Notes (Signed)
Have given pt a meal tray. Is eating chicken fingers and fries.

## 2019-09-29 NOTE — ED Notes (Signed)
Pt sitting up in bed to get out of bed, nurse asked patient if he needed anything, patient redirected and laid back down in bed.

## 2019-09-29 NOTE — ED Notes (Signed)
Pt has eaten his breakfast. He has not been aggressive towards this nurse. Calm and cooperative.

## 2019-09-29 NOTE — ED Notes (Signed)
Have fully changed pt's brief, gown and bed sheets. Have given pt a bed bath as well. Now lying comfortably in bed watching tv. Has taken all of medications without difficulty. NAD

## 2019-09-30 LAB — BASIC METABOLIC PANEL
Anion gap: 10 (ref 5–15)
BUN: 16 mg/dL (ref 8–23)
CO2: 29 mmol/L (ref 22–32)
Calcium: 8.4 mg/dL — ABNORMAL LOW (ref 8.9–10.3)
Chloride: 99 mmol/L (ref 98–111)
Creatinine, Ser: 1.11 mg/dL (ref 0.61–1.24)
GFR calc Af Amer: >60 mL/min (ref >60)
GFR calc non Af Amer: >60 mL/min (ref >60)
Glucose, Bld: 115 mg/dL — ABNORMAL HIGH (ref 70–99)
Potassium: 3.9 mmol/L (ref 3.5–5.1)
Sodium: 138 mmol/L (ref 135–145)

## 2019-09-30 LAB — CBC WITH DIFFERENTIAL/PLATELET
Abs Immature Granulocytes: 0.02 10*3/uL (ref 0.00–0.07)
Basophils Absolute: 0 10*3/uL (ref 0.0–0.1)
Basophils Relative: 1 %
Eosinophils Absolute: 0.1 10*3/uL (ref 0.0–0.5)
Eosinophils Relative: 3 %
HCT: 36.6 % — ABNORMAL LOW (ref 39.0–52.0)
Hemoglobin: 11.7 g/dL — ABNORMAL LOW (ref 13.0–17.0)
Immature Granulocytes: 1 %
Lymphocytes Relative: 41 %
Lymphs Abs: 1.7 10*3/uL (ref 0.7–4.0)
MCH: 30.2 pg (ref 26.0–34.0)
MCHC: 32 g/dL (ref 30.0–36.0)
MCV: 94.3 fL (ref 80.0–100.0)
Monocytes Absolute: 0.4 10*3/uL (ref 0.1–1.0)
Monocytes Relative: 11 %
Neutro Abs: 1.8 10*3/uL (ref 1.7–7.7)
Neutrophils Relative %: 43 %
Platelets: 209 10*3/uL (ref 150–400)
RBC: 3.88 MIL/uL — ABNORMAL LOW (ref 4.22–5.81)
RDW: 15.9 % — ABNORMAL HIGH (ref 11.5–15.5)
WBC: 4.2 10*3/uL (ref 4.0–10.5)
nRBC: 0 % (ref 0.0–0.2)

## 2019-09-30 LAB — SARS CORONAVIRUS 2 BY RT PCR (HOSPITAL ORDER, PERFORMED IN ~~LOC~~ HOSPITAL LAB): SARS Coronavirus 2: NEGATIVE

## 2019-09-30 NOTE — ED Notes (Signed)
Pt woke up to ambulate to restroom.  

## 2019-09-30 NOTE — ED Notes (Signed)
Pt up and ambulated to restroom. Pt provided new mask to wear when in ED hall.

## 2019-09-30 NOTE — BH Assessment (Addendum)
[place] Assessment Progress Note   Patient was seen for re-assessment.  He was smiling and laughing inappropropriately throughout the assessment process.  When asked what brought him to the hospital, he was unable to identify why he was there.  He stated that he came there to get something "to turn it off."  When asked how old he was in his fifties. When asked who he lives with, he stated, "my mother."  Patient's nurse states that patient has been cooperative.  However, patient does not have the ability to make rational decisions for himself due to his dementia.  Inpatient treatment continues to be recommended possibly followed by SNF placement.

## 2019-09-30 NOTE — Progress Notes (Signed)
CSW spoke with Tye Maryland at [facility]. She will have their doctor review pt again for admission again today. She requests that the last two days of nursing notes be faxed to her and that if accepted, pt will need to have labs drawn again as it has been longer than 48 hours.   Audree Camel, LCSW, [place] Disposition [place] [facility]/TTS [phone]

## 2019-09-30 NOTE — ED Notes (Signed)
Sitting up in [employer]

## 2019-09-30 NOTE — ED Notes (Signed)
TTS in progress 

## 2019-09-30 NOTE — ED Provider Notes (Signed)
Patient alert, content, calm, and in no acute distress. Vitals normal. Eating and drinking well.   Awaiting social work placement of patient.      Lajean Saver, MD 09/30/19 [phone]

## 2019-09-30 NOTE — ED Notes (Signed)
Pt sitting up and eating breakfast 

## 2019-09-30 NOTE — ED Notes (Signed)
[place] called and [place] reviewing charts . Requesting for current labs

## 2019-09-30 NOTE — BH Assessment (Signed)
Recent chart note and labs FAX'ed to [facility] for review.

## 2019-09-30 NOTE — ED Notes (Signed)
Pt ambulated directed pt to BR, then came immediately out. Difficulty in redirecting in room. Able to get into room and change brief. Skin intact to bottom. Sitting up in [place] at this time. Took meds without difficulty

## 2019-10-01 LAB — URINALYSIS, ROUTINE W REFLEX MICROSCOPIC
Bilirubin Urine: NEGATIVE
Glucose, UA: NEGATIVE mg/dL
Hgb urine dipstick: NEGATIVE
Ketones, ur: NEGATIVE mg/dL
Leukocytes,Ua: NEGATIVE
Nitrite: NEGATIVE
Protein, ur: NEGATIVE mg/dL
Specific Gravity, Urine: 1.012 (ref 1.005–1.030)
pH: 8 (ref 5.0–8.0)

## 2019-10-01 LAB — VALPROIC ACID LEVEL: Valproic Acid Lvl: 49 ug/mL — ABNORMAL LOW (ref 50.0–100.0)

## 2019-10-01 MED ORDER — HYDROXYZINE HCL 50 MG/ML IM SOLN
50.0000 mg | Freq: Once | INTRAMUSCULAR | Status: AC
  Administered 2019-10-01: 50 mg via INTRAMUSCULAR
  Filled 2019-10-01: qty 1

## 2019-10-01 NOTE — ED Notes (Signed)
Attempted to check pt's brief due to foul odor in room. Pt would not let me check brief and I will try again later.

## 2019-10-01 NOTE — ED Notes (Signed)
Patient out of room at this time, stating that he wanted to go home. Tried to redirect patient back to room letting him know his wife would be here later.  Patient trying to go in other patient's room. Patient becoming agitated, other staff trying to redirect patient also.  Security back to help assist with patient. Patient back in room. RN Marita [place] in room with patient along with Security.

## 2019-10-01 NOTE — ED Provider Notes (Signed)
This patient has been somewhat difficult to redirect overnight, his wife is not here, he seems to be more agitated when she is not at the bedside, he has been awaiting placement at [facility], and according to notes yesterday at 9:53 AM, and 1:13 PM the patient has been seen by psychiatry and the licensed clinical social worker and has been continued to be referred to [facility] or inpatient treatment and then placement in a long-term care facility.  The patient is sitting in a chair, there is security and a sitter at the door frequently redirecting the patient.  He did not seem to sleep very well last night, formal recommendation made for medication adjustments by psychiatry this morning.   Noemi Chapel, MD 10/01/19 [phone]

## 2019-10-01 NOTE — ED Notes (Signed)
Pt spouse, Pam here to visit pt- Air cabin crew, Dodi, NT removed temporarily as sitter for this pt while spouse visits with pt.

## 2019-10-01 NOTE — BH Assessment (Signed)
[place] Assessment Progress Note   TTS called to set up tele-psych machine to re-assess patient.  RN states that patient is not talking this morning.  He states that they had an episode with the patient last night and he was difficult to re-direct.  He states that patient is sundowning and walking around looking for his wife after she leaves the hospital.  RN states that patient is more agitated at night and he is requesting [facility] Provider review his medications and out in orders for something that will calm him down at night.  TTS questioned if a Social Work Consult had been ordered because it appears that patient could benefit from SNF placement.  It is questionable as to the benefit of psychiatric inpatient given the severity of his dementia.  TTS to have [facility] provider to review medications.

## 2019-10-01 NOTE — ED Provider Notes (Signed)
Patient has been awake most the night, I see him in his bed or sitting on the side of the bed when I walked past.  Nurses report he is now starting trying to get out of his room.  He was given Vistaril IM.   Rolland Porter, MD 10/01/19 [phone]

## 2019-10-01 NOTE — ED Notes (Signed)
Patient up wandering in room. Patient given graham crackers at this time.

## 2019-10-01 NOTE — ED Notes (Signed)
Pt is still very adamant about leaving facility and security is at bedside. Pt will not stay in is bed at this time.

## 2019-10-02 MED ORDER — VALPROATE SODIUM 250 MG/5ML PO SOLN
625.0000 mg | Freq: Two times a day (BID) | ORAL | Status: DC
  Administered 2019-10-02 – 2019-10-03 (×3): 625 mg via ORAL
  Filled 2019-10-02 (×7): qty 12.5

## 2019-10-02 NOTE — ED Provider Notes (Signed)
This patient has had a good night, he has been easily redirectable, sleeping intermittently, Depakote level was therapeutic, his spouse has been with him intermittently and he seems to do better during those episodes.  Will obtain social work consult ongoing to help with both skilled nursing facility placement and inpatient behavioral placement which is currently ongoing.  Thankfully the patient has had a good 24 hours, his vital signs were reviewed and showed no hypotension tachycardia or fever.  There is no difficulty with breathing or oxygenation and he is taking his medications appropriately.  At this time he is resting peacefully, safety measures have been taken   Noemi Chapel, MD 10/02/19 [phone]

## 2019-10-02 NOTE — ED Notes (Addendum)
Pt up to bathroom and back to room - pt got back in bed unassisted

## 2019-10-02 NOTE — ED Notes (Signed)
Pt spouse, Pam leaving at this time- pt is sleeping- tele sitter order in process. Pam says to call her if pt gets out of hand during the night.

## 2019-10-02 NOTE — ED Notes (Addendum)
Avasys in pt room, plugged in, called tele-sitter number and gave report on pt- spoke with tele-sitter, Artricias, lead VMT

## 2019-10-02 NOTE — ED Notes (Signed)
TTS at bedside patient would not wake up at this time for them to evaluate.

## 2019-10-02 NOTE — ED Notes (Signed)
Pt refusing tray Pt brought in food for pt while she visted

## 2019-10-02 NOTE — ED Notes (Signed)
Pt sleeping on and off. Pt able to wake up to be fed by staff

## 2019-10-02 NOTE — ED Notes (Signed)
Wife in to see patients
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
## 2019-10-02 NOTE — ED Notes (Signed)
Pt has been very cooperative and easily re-directed through the night. Pt Depakote level was redrawn today and is 49. This level was less than 10 when he initially arrived to this ED.

## 2019-10-02 NOTE — ED Notes (Signed)
Patient's family member at bedside. 
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
## 2019-10-03 DIAGNOSIS — J3089 Other allergic rhinitis: Secondary | ICD-10-CM | POA: Diagnosis not present

## 2019-10-03 DIAGNOSIS — Z1329 Encounter for screening for other suspected endocrine disorder: Secondary | ICD-10-CM | POA: Diagnosis not present

## 2019-10-03 DIAGNOSIS — E860 Dehydration: Secondary | ICD-10-CM | POA: Diagnosis not present

## 2019-10-03 DIAGNOSIS — I428 Other cardiomyopathies: Secondary | ICD-10-CM | POA: Diagnosis not present

## 2019-10-03 DIAGNOSIS — M109 Gout, unspecified: Secondary | ICD-10-CM | POA: Diagnosis not present

## 2019-10-03 DIAGNOSIS — I872 Venous insufficiency (chronic) (peripheral): Secondary | ICD-10-CM | POA: Diagnosis not present

## 2019-10-03 DIAGNOSIS — Z0489 Encounter for examination and observation for other specified reasons: Secondary | ICD-10-CM | POA: Diagnosis not present

## 2019-10-03 DIAGNOSIS — R69 Illness, unspecified: Secondary | ICD-10-CM | POA: Diagnosis not present

## 2019-10-03 DIAGNOSIS — Z13228 Encounter for screening for other metabolic disorders: Secondary | ICD-10-CM | POA: Diagnosis not present

## 2019-10-03 DIAGNOSIS — Z712 Person consulting for explanation of examination or test findings: Secondary | ICD-10-CM | POA: Diagnosis not present

## 2019-10-03 DIAGNOSIS — F0391 Unspecified dementia with behavioral disturbance: Secondary | ICD-10-CM | POA: Diagnosis not present

## 2019-10-03 DIAGNOSIS — Z79899 Other long term (current) drug therapy: Secondary | ICD-10-CM | POA: Diagnosis not present

## 2019-10-03 DIAGNOSIS — E559 Vitamin D deficiency, unspecified: Secondary | ICD-10-CM | POA: Diagnosis not present

## 2019-10-03 DIAGNOSIS — I10 Essential (primary) hypertension: Secondary | ICD-10-CM | POA: Diagnosis not present

## 2019-10-03 DIAGNOSIS — R5383 Other fatigue: Secondary | ICD-10-CM | POA: Diagnosis not present

## 2019-10-03 DIAGNOSIS — F419 Anxiety disorder, unspecified: Secondary | ICD-10-CM | POA: Diagnosis not present

## 2019-10-03 DIAGNOSIS — Z13220 Encounter for screening for lipoid disorders: Secondary | ICD-10-CM | POA: Diagnosis not present

## 2019-10-03 DIAGNOSIS — Z131 Encounter for screening for diabetes mellitus: Secondary | ICD-10-CM | POA: Diagnosis not present

## 2019-10-03 DIAGNOSIS — R609 Edema, unspecified: Secondary | ICD-10-CM | POA: Diagnosis not present

## 2019-10-03 DIAGNOSIS — Z5181 Encounter for therapeutic drug level monitoring: Secondary | ICD-10-CM | POA: Diagnosis not present

## 2019-10-03 DIAGNOSIS — Z130 Encounter for screening for diseases of the blood and blood-forming organs and certain disorders involving the immune mechanism: Secondary | ICD-10-CM | POA: Diagnosis not present

## 2019-10-03 DIAGNOSIS — F0151 Vascular dementia with behavioral disturbance: Secondary | ICD-10-CM | POA: Diagnosis not present

## 2019-10-03 DIAGNOSIS — R3915 Urgency of urination: Secondary | ICD-10-CM | POA: Diagnosis not present

## 2019-10-03 NOTE — ED Notes (Signed)
Informed patient's wife he has a bed at [facility], wife said she would come in get him and bring him home instead, wife Olin Hauser), spoke to [employer], and still has decided to bring patient home.

## 2019-10-03 NOTE — ED Notes (Signed)
Patient was feed lunch by tech, patient wife is here to take him home, misunderstood about facility husband had a bed for, informed her it was geri-pysch facility and not another nursing, patient's wife spoke to Judson Roch at [facility] and is in agreement for patient to be sent to [facility], once IVC paperwork served and faxed, report can be called to facility.

## 2019-10-03 NOTE — ED Notes (Signed)
Pt changed and room tidied

## 2019-10-03 NOTE — ED Notes (Signed)
Attempted to wake patient for breakfast, patient still sleeping, informed him breakfast tray was at bedside if he wanted to eat later.

## 2019-10-03 NOTE — ED Provider Notes (Addendum)
This patient was reexamined this morning at 6:46 AM on October 03, 2019.  He has been in the emergency department for multiple days, he has been seen by psychiatry daily, the patient has dementia, he has been redirectable, he has not been violent in a couple of days, there has been no aggression, he does have some difficulty with sundowning and difficulty sleeping at night.  His Depakote level is getting better and has been increased as of yesterday to 625 mg twice daily.    The patient currently has had his vital signs reviewed, there is no hypotension tachycardia or fever, he is well-appearing and resting peacefully at this time, awaiting placement, social work will be contacted to help with this today.  12:55 PM I discussed the patient's care with his wife, she states that she does not want him to go into long-term care or into a advanced facility but would rather care for him at home.  However she does recognize that these outburst may continue when she does not have the resources to continue to care for him at that level  After further discussion with the spouse, she now understands that he was not going into a skilled nursing facility but into short-term care for his decompensated dementia and is willing to allow that to happen.  The patient has been involuntarily committed due to the occasional violent outburst as he will need to have involuntary commitment to go to the facility that has accepted him.  The spouse is in agreement with this plan.   Noemi Chapel, MD 10/03/19 [id number]    Noemi Chapel, MD 10/03/19 1255    Noemi Chapel, MD 10/03/19 [phone]

## 2019-10-03 NOTE — Progress Notes (Signed)
Pt accepted to [facility]; unit 900.     Dr. Stacie Glaze is the attending provider.   Call report to [phone]  [facility] notified.    Pt is currently voluntary but will need to be placed under IVC prior to transport. Law enforcement will transport pt.  Completed IVC should be faxed to [facility] at [phone] Pt may arrive as soon as transportation is arranged.   Audree Camel, LCSW, [place] [facility] [phone]

## 2019-10-03 NOTE — ED Notes (Signed)
Informed Judson Roch, [facility], that patient's wife spouse is taking him home.

## 2019-10-03 NOTE — ED Notes (Signed)
Pt diaper changed.  Urine x3. Pt eating dinner tray

## 2019-10-03 NOTE — ED Notes (Signed)
Patient fed breakfast tray by this RN, ate 100%, patient incontinent of urine, attempted to have him use urinal, he stated he didn't need it, change brief and pad on bed, patient resting in bed with eyes closed, NAD, bed in lowest position, call bell in reach, bed locked, will continue to monitor.

## 2019-10-04 DIAGNOSIS — R69 Illness, unspecified: Secondary | ICD-10-CM | POA: Diagnosis not present

## 2019-10-05 DIAGNOSIS — R69 Illness, unspecified: Secondary | ICD-10-CM | POA: Diagnosis not present

## 2019-10-20 ENCOUNTER — Telehealth: Payer: Self-pay | Admitting: *Deleted

## 2019-10-20 NOTE — Telephone Encounter (Signed)
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  Updated medlist. Pt discharged 10-19-19 to home.

## 2019-11-29 ENCOUNTER — Other Ambulatory Visit: Payer: Self-pay

## 2019-11-29 ENCOUNTER — Encounter: Payer: Self-pay | Admitting: Family Medicine

## 2019-11-29 ENCOUNTER — Ambulatory Visit (INDEPENDENT_AMBULATORY_CARE_PROVIDER_SITE_OTHER): Payer: Medicare HMO | Admitting: Family Medicine

## 2019-11-29 VITALS — BP 140/80 | HR 86 | Temp 98.3°F | Resp 15 | Ht 71.0 in | Wt 260.4 lb

## 2019-11-29 DIAGNOSIS — M545 Low back pain, unspecified: Secondary | ICD-10-CM | POA: Insufficient documentation

## 2019-11-29 DIAGNOSIS — R109 Unspecified abdominal pain: Secondary | ICD-10-CM

## 2019-11-29 LAB — POCT URINALYSIS DIP (CLINITEK)
Bilirubin, UA: NEGATIVE
Blood, UA: NEGATIVE
Glucose, UA: NEGATIVE mg/dL
Ketones, POC UA: NEGATIVE mg/dL
Leukocytes, UA: NEGATIVE
Nitrite, UA: NEGATIVE
POC PROTEIN,UA: NEGATIVE
Spec Grav, UA: 1.025 (ref 1.010–1.025)
Urobilinogen, UA: 0.2 E.U./dL
pH, UA: 8 (ref 5.0–8.0)

## 2019-11-29 MED ORDER — METHYLPREDNISOLONE ACETATE 80 MG/ML IJ SUSP
80.0000 mg | Freq: Once | INTRAMUSCULAR | Status: AC
  Administered 2019-11-29: 80 mg via INTRAMUSCULAR

## 2019-11-29 MED ORDER — PREDNISONE 10 MG (21) PO TBPK: ORAL_TABLET | ORAL | 0 refills | Status: DC

## 2019-11-29 NOTE — Patient Instructions (Addendum)
  I appreciate the opportunity to provide you with care for your health and wellness. Today we discussed: back pain   Follow up: 3 months   No labs or referrals today  Start prednisone dose pack tomorrow. Injection today to help with pain.  I hope you have a wonderful, happy, safe, and healthy Holiday Season! See you in the New Year :)  Please continue to practice social distancing to keep you, your family, and our community safe.  If you must go out, please wear a mask and practice good handwashing.  It was a pleasure to see you and I look forward to continuing to work together on your health and well-being. Please do not hesitate to call the office if you need care or have questions about your care.  Have a wonderful day and week. With Gratitude, Cherly Beach, DNP, AGNP-BC

## 2019-11-29 NOTE — Progress Notes (Signed)
Subjective:  Patient ID: Marvin [name], male    DOB: [birth year]  Age: 68 y.o. MRN: [id number]  CC:  Chief Complaint  Patient presents with  . Back Pain    c/o back pain about 1 month ago      HPI  Back pain that started about a month ago after coming home from skilled nursing facility. Reports from wife-as he has dementia. She states, over last two week he has had a increase in complaints about his back bothering him more.  Back Pain This is a new problem. The current episode started 1 to 4 weeks ago. The problem occurs daily. The problem has been gradually worsening since onset. The pain is present in the lumbar spine. The pain does not radiate. The symptoms are aggravated by sitting and standing. Pertinent negatives include no bladder incontinence, bowel incontinence, leg pain or weakness. He has tried analgesics for the symptoms. The treatment provided no relief.    Past Medical History:  Diagnosis Date  . Degenerative joint disease   . Dementia ([place])   . Gout   . Hemorrhoids    Dr. Romona Curls  . Hyperlipidemia   . Hypertension   . Impaired glucose tolerance   . Premature atrial contractions       Family History  Problem Relation Age of Onset  . Alzheimer's disease Mother   . Diabetes Father   . Heart failure Father        prostate disease   . Alzheimer's disease Sister   . Kidney failure Brother   . Diabetes Brother   . High blood pressure Brother   . Cancer Brother      Current Meds  Medication Sig  . allopurinol (ZYLOPRIM) 300 MG tablet Take 300 mg by mouth at bedtime.  Marland Kitchen amLODipine (NORVASC) 10 MG tablet Take 10 mg by mouth daily.  . cholecalciferol (VITAMIN D3) 25 MCG (1000 UT) tablet Take 1,000 Units by mouth daily.  . divalproex (DEPAKOTE ER) 500 MG 24 hr tablet 500 mg daily.   Marland Kitchen donepezil (ARICEPT) 10 MG tablet Take 1 tablet (10 mg total) by mouth at bedtime.  Marland Kitchen LORazepam (ATIVAN) 0.5 MG tablet Take 0.5 mg by mouth stat.  . Melatonin 5 MG  CAPS Take by mouth at bedtime.  . tamsulosin (FLOMAX) 0.4 MG CAPS capsule Take 0.4 mg by mouth daily.    ROS:  Review of Systems  Gastrointestinal: Negative for bowel incontinence.  Genitourinary: Negative for bladder incontinence.  Musculoskeletal: Positive for back pain.  Neurological: Negative for weakness.     Objective:   Today's Vitals: BP 140/80   Pulse 86   Temp 98.3 F (36.8 C) (Oral)   Resp 15   Ht 5\' 11"  (1.803 m)   Wt 260 lb 6.4 oz (118.1 kg)   SpO2 100%   BMI 36.32 kg/m  Vitals with BMI 11/29/2019 10/03/2019 10/03/2019  Height 5\' 11"  - -  Weight 260 lbs 6 oz - -  BMI [id number] - -  Systolic [id number]  Diastolic 80 67 66  Pulse 86 68 66     Physical Exam Vitals and nursing note reviewed.  Constitutional:      Appearance: Normal appearance. He is obese.  HENT:     Head: Normocephalic and atraumatic.     Right Ear: External ear normal.     Left Ear: External ear normal.     Nose: Nose normal.     Mouth/Throat:  Pharynx: Oropharynx is clear.  Eyes:     General:        Right eye: No discharge.        Left eye: No discharge.     Conjunctiva/sclera: Conjunctivae normal.  Cardiovascular:     Rate and Rhythm: Normal rate and regular rhythm.     Pulses: Normal pulses.     Heart sounds: Normal heart sounds.  Pulmonary:     Effort: Pulmonary effort is normal.     Breath sounds: Normal breath sounds.  Abdominal:     Tenderness: There is no right CVA tenderness or left CVA tenderness.  Musculoskeletal:        General: Normal range of motion.     Cervical back: Normal range of motion and neck supple.  Skin:    General: Skin is warm.  Neurological:     General: No focal deficit present.     Mental Status: He is alert and oriented to person, place, and time.  Psychiatric:        Mood and Affect: Mood normal.        Behavior: Behavior normal.        Thought Content: Thought content normal.        Judgment: Judgment normal.     Assessment      Tests ordered Orders Placed This Encounter  Procedures  . POCT URINALYSIS DIP (CLINITEK)     Plan: 1. Acute bilateral low back pain without sciatica S&S suggest acute inflammation in the low back. Most likely related to more sitting and lack of exercise. Will try dose pack and injection. Continue voltaren gel, tylenol, and heating pad as needed.  - predniSONE (STERAPRED UNI-PAK 21 TAB) 10 MG (21) TBPK tablet; Take as directed  Dispense: 21 tablet; Refill: 0 - methylPREDNISolone acetate (DEPO-MEDROL) injection 80 mg  2. Flank pain R/O UTI  Neg UA  - POCT URINALYSIS DIP (CLINITEK)   Meds ordered this encounter  Medications  . predniSONE (STERAPRED UNI-PAK 21 TAB) 10 MG (21) TBPK tablet    Sig: Take as directed    Dispense:  21 tablet    Refill:  0    Order Specific Question:   Supervising Provider    Answer:   SIMPSON, MARGARET E [id number]  . methylPREDNISolone acetate (DEPO-MEDROL) injection 80 mg    Patient to follow-up in 3 months   Perlie Mayo, NP

## 2019-12-21 ENCOUNTER — Other Ambulatory Visit: Payer: Self-pay

## 2019-12-21 ENCOUNTER — Telehealth: Payer: Self-pay | Admitting: *Deleted

## 2019-12-21 MED ORDER — DIVALPROEX SODIUM ER 500 MG PO TB24: 500.0000 mg | ORAL_TABLET | Freq: Every day | ORAL | 1 refills | Status: DC

## 2019-12-21 MED ORDER — SIMVASTATIN 80 MG PO TABS: 80.0000 mg | ORAL_TABLET | Freq: Every day | ORAL | 1 refills | Status: DC

## 2019-12-21 MED ORDER — ALLOPURINOL 300 MG PO TABS: 300.0000 mg | ORAL_TABLET | Freq: Every day | ORAL | 1 refills | Status: DC

## 2019-12-21 MED ORDER — AMLODIPINE BESYLATE 10 MG PO TABS: 10.0000 mg | ORAL_TABLET | Freq: Every day | ORAL | 1 refills | Status: DC

## 2019-12-21 MED ORDER — LORAZEPAM 0.5 MG PO TABS: 0.5000 mg | ORAL_TABLET | Freq: Every day | ORAL | 1 refills | Status: DC

## 2019-12-21 MED ORDER — OLANZAPINE 10 MG PO TABS: 10.0000 mg | ORAL_TABLET | Freq: Every day | ORAL | 1 refills | Status: DC

## 2019-12-21 MED ORDER — TAMSULOSIN HCL 0.4 MG PO CAPS: 0.4000 mg | ORAL_CAPSULE | Freq: Every day | ORAL | 1 refills | Status: DC

## 2019-12-21 MED ORDER — DONEPEZIL HCL 10 MG PO TABS: 10.0000 mg | ORAL_TABLET | Freq: Every day | ORAL | 1 refills | Status: DC

## 2019-12-21 MED ORDER — ESCITALOPRAM OXALATE 20 MG PO TABS: 20.0000 mg | ORAL_TABLET | Freq: Every day | ORAL | 1 refills | Status: DC

## 2019-12-21 NOTE — Telephone Encounter (Signed)
Pt needs refills on Amlodopine 10 mg tamsulosine 0.4mg  olanzapine10 mg divalproex er 500 mg lorazepam 0.5mg  simavastatin 80 mg donepezil hcl 10 mg allopiurinol 300 mg escitalopram 20 mg. She is supposed to be setting him up an appointment with behavioral health however this has not been set up yet

## 2019-12-21 NOTE — Telephone Encounter (Signed)
Pls refill x 2 months, and move his f/u appt with me to mid Feb, explain to spouse need for sooner appt since I have not evaluated him for over 6 months and am filling the meds. Still needs to establish with Psych Please reschedule  march appt as above also, thanks

## 2019-12-21 NOTE — Telephone Encounter (Signed)
Called spouse to move appt up. No answer, vm full. Will try again tomorrow morning due to work schedule

## 2019-12-21 NOTE — Telephone Encounter (Signed)
Is it ok to refill these medications?

## 2019-12-21 NOTE — Telephone Encounter (Signed)
Medications refilled

## 2019-12-22 NOTE — Telephone Encounter (Signed)
Moved appt up

## 2020-01-03 ENCOUNTER — Encounter: Payer: Self-pay | Admitting: Adult Health

## 2020-01-03 ENCOUNTER — Other Ambulatory Visit: Payer: Self-pay

## 2020-01-03 ENCOUNTER — Ambulatory Visit (INDEPENDENT_AMBULATORY_CARE_PROVIDER_SITE_OTHER): Payer: Medicare HMO | Admitting: Adult Health

## 2020-01-03 VITALS — BP 134/80 | HR 81 | Temp 97.8°F | Ht 71.0 in | Wt 274.4 lb

## 2020-01-03 DIAGNOSIS — R400 Somnolence: Secondary | ICD-10-CM

## 2020-01-03 DIAGNOSIS — Z5181 Encounter for therapeutic drug level monitoring: Secondary | ICD-10-CM

## 2020-01-03 DIAGNOSIS — F0391 Unspecified dementia with behavioral disturbance: Secondary | ICD-10-CM

## 2020-01-03 DIAGNOSIS — R69 Illness, unspecified: Secondary | ICD-10-CM | POA: Diagnosis not present

## 2020-01-03 NOTE — Progress Notes (Signed)
I have read the note, and I agree with the clinical assessment and plan.  Sarita Hakanson K Atina Feeley   

## 2020-01-03 NOTE — Patient Instructions (Signed)
Your Plan:  Continue Aricept  Will check blood work today due to increase sleepiness If your symptoms worsen or you develop new symptoms please let us know.    Thank you for coming to see [place] at [facility]. I hope we have been able to provide you high quality care today.  You may receive a patient satisfaction survey over the next few weeks. We would appreciate your feedback and comments so that we may continue to improve ourselves and the health of our patients.

## 2020-01-03 NOTE — Progress Notes (Signed)
PATIENT: Marvin [name] DOB: 03-17-1951  REASON FOR VISIT: follow up HISTORY FROM: patient  HISTORY OF PRESENT ILLNESS: Today 01/03/20:  Marvin [name] is a 69 year old male with a history of memory disturbance.  He returns today for follow-up.  He is with his wife today.  She reports that he recently went to a facility but she felt that he was being neglected so she brought him back home.  She now has an Environmental consultant that helps her during the night while she works.  He requires assistance with all ADLs.  He is able to feed himself.  He does not operate a motor vehicle.  He is currently on Zyprexa, Ativan, Lexapro and Depakote.  These are currently being managed by his PCP.  He remains on Aricept 10 mg daily.  His wife notes that he in the last week has been sleeping more during the day.  She reports that he sleeps well at night.  He returns today for an evaluation.  HISTORY 06/20/19:  Marvin [name] is a 69 year old male with a history of memory disturbance.  He joins me today for follow-up.  His wife is with him.  She states that earlier this month he was admitted at [facility] for medical for behavioral issues.  She states that he was aggressive with her but not physical.  His medication was switched from Seroquel to Risperdal.  She states initially the change in medication was helpful however she states that he has began wandering off again.  She reports that she works third shift and has someone stay with him during the night.  She has to sleep during the day.  He requires assistance with all ADLs.  He no longer operates a Teacher, music.  She manages his medications and finances.  She reports that she has looked into a memory facility but has met some roadblocks with this.  She does acknowledge that she does need more assistance with him.  REVIEW OF SYSTEMS: Out of a complete 14 system review of symptoms, the patient complains only of the following symptoms, and all other reviewed systems are  negative.  See HPI  ALLERGIES: Allergies  Allergen Reactions  . Aspirin Other (See Comments)    Stomach upset  . Namenda [Memantine Hcl] Other (See Comments)    Increased dementia behavior issues  . Naprosyn [Naproxen] Hives  . Penicillins     Did it involve swelling of the face/tongue/throat, SOB, or low BP? Unknown Did it involve sudden or severe rash/hives, skin peeling, or any reaction on the inside of your mouth or nose? Unknown Did you need to seek medical attention at a hospital or doctor's office? Unknown When did it last happen?Unknown If all above answers are "NO", may proceed with cephalosporin use.     HOME MEDICATIONS: Outpatient Medications Prior to Visit  Medication Sig Dispense Refill  . allopurinol (ZYLOPRIM) 300 MG tablet Take 1 tablet (300 mg total) by mouth at bedtime. 30 tablet 1  . amLODipine (NORVASC) 10 MG tablet Take 1 tablet (10 mg total) by mouth daily. 30 tablet 1  . cholecalciferol (VITAMIN D3) 25 MCG (1000 UT) tablet Take 1,000 Units by mouth daily.    . divalproex (DEPAKOTE ER) 500 MG 24 hr tablet Take 1 tablet (500 mg total) by mouth daily. 30 tablet 1  . donepezil (ARICEPT) 10 MG tablet Take 1 tablet (10 mg total) by mouth at bedtime. 30 tablet 1  . escitalopram (LEXAPRO) 20 MG tablet Take 1 tablet (20  mg total) by mouth daily. 30 tablet 1  . LORazepam (ATIVAN) 0.5 MG tablet Take 1 tablet (0.5 mg total) by mouth daily. 30 tablet 1  . Melatonin 5 MG CAPS Take by mouth at bedtime.    Marland Kitchen OLANZapine (ZYPREXA) 10 MG tablet Take 1 tablet (10 mg total) by mouth at bedtime. 30 tablet 1  . simvastatin (ZOCOR) 80 MG tablet Take 1 tablet (80 mg total) by mouth daily at 6 PM. 30 tablet 1  . tamsulosin (FLOMAX) 0.4 MG CAPS capsule Take 1 capsule (0.4 mg total) by mouth daily. 30 capsule 1  . predniSONE (STERAPRED UNI-PAK 21 TAB) 10 MG (21) TBPK tablet Take as directed 21 tablet 0   No facility-administered medications prior to visit.    PAST MEDICAL  HISTORY: Past Medical History:  Diagnosis Date  . Degenerative joint disease   . Dementia ([place])   . Gout   . Hemorrhoids    Dr. Romona Curls  . Hyperlipidemia   . Hypertension   . Impaired glucose tolerance   . Premature atrial contractions     PAST SURGICAL HISTORY: Past Surgical History:  Procedure Laterality Date  . COLONOSCOPY  07/2004  . COLONOSCOPY N/A 09/22/2014   Procedure: COLONOSCOPY;  Surgeon: Daneil Dolin, MD;  Location: [facility];  Service: Endoscopy;  Laterality: N/A;  9:30 AM        PT REQUEST TIME  . KNEE ARTHROSCOPY     Right    FAMILY HISTORY: Family History  Problem Relation Age of Onset  . Alzheimer's disease Mother   . Diabetes Father   . Heart failure Father        prostate disease   . Alzheimer's disease Sister   . Kidney failure Brother   . Diabetes Brother   . High blood pressure Brother   . Cancer Brother     SOCIAL HISTORY: Social History   Socioeconomic History  . Marital status: Married    Spouse name: Not on file  . Number of children: 1  . Years of education: 64  . Highest education level: Not on file  Occupational History  . Occupation: Systems developer: [employer]  Tobacco Use  . Smoking status: Former Smoker    Quit date: 1992    Years since quitting: 29.0  . Smokeless tobacco: Never Used  Substance and Sexual Activity  . Alcohol use: No    Comment: Quit 1992  . Drug use: No  . Sexual activity: Not on file    Comment: Married  Other Topics Concern  . Not on file  Social History Narrative   Lives at home w/ his wife   Right-handed   Caffeine: 1 cup daily   Education- assoc degree   Social Determinants of Health   Financial Resource Strain:   . Difficulty of Paying Living Expenses: Not on file  Food Insecurity:   . Worried About Charity fundraiser in the Last Year: Not on file  . Ran Out of Food in the Last Year: Not on file  Transportation Needs:   . Lack of Transportation (Medical): Not on  file  . Lack of Transportation (Non-Medical): Not on file  Physical Activity:   . Days of Exercise per Week: Not on file  . Minutes of Exercise per Session: Not on file  Stress:   . Feeling of Stress : Not on file  Social Connections:   . Frequency of Communication with Friends and Family: Not on  file  . Frequency of Social Gatherings with Friends and Family: Not on file  . Attends Religious Services: Not on file  . Active Member of Clubs or Organizations: Not on file  . Attends Archivist Meetings: Not on file  . Marital Status: Not on file  Intimate Partner Violence:   . Fear of Current or Ex-Partner: Not on file  . Emotionally Abused: Not on file  . Physically Abused: Not on file  . Sexually Abused: Not on file      PHYSICAL EXAM  Vitals:   01/03/20 0910  BP: 134/80  Pulse: 81  Temp: 97.8 F (36.6 C)  TempSrc: Oral  Weight: 274 lb 6.4 oz (124.5 kg)  Height: 5' 11"  (1.803 m)   Body mass index is 38.27 kg/m.   MMSE - Mini Mental State Exam 01/03/2020 06/20/2019 04/27/2018  Not completed: Unable to complete Unable to complete -  Orientation to time - - 0  Orientation to Place - - 3  Registration - - 3  Attention/ Calculation - - 0  Recall - - 0  Language- name 2 objects - - 2  Language- repeat - - 0  Language- follow 3 step command - - 1  Language- follow 3 step command-comments - - used L hand then switched it to R, did not drop on floor  Language- read & follow direction - - 0  Language-read & follow direction-comments - - spelled each word  Write a sentence - - 0  Copy design - - 1  Total score - - 10     Generalized: Well developed, in no acute distress   Neurological examination  Mentation: Alert oriented to time, place, history taking. Follows all commands speech and language fluent Cranial nerve II-XII: Pupils were equal round reactive to light. Extraocular movements were full, visual field were full on confrontational test. . Head turning and  shoulder shrug  were normal and symmetric. Motor: The motor testing reveals 5 over 5 strength of all 4 extremities. Good symmetric motor tone is noted throughout.  Sensory: Sensory testing is intact to soft touch on all 4 extremities. No evidence of extinction is noted.  Coordination: Cerebellar testing reveals good finger-nose-finger and heel-to-shin bilaterally.  Gait and station: Gait is normal.    DIAGNOSTIC DATA (LABS, IMAGING, TESTING) - I reviewed patient records, labs, notes, testing and imaging myself where available.  Lab Results  Component Value Date   WBC 4.2 09/30/2019   HGB 11.7 (L) 09/30/2019   HCT 36.6 (L) 09/30/2019   MCV 94.3 09/30/2019   PLT 209 09/30/2019      Component Value Date/Time   NA 138 09/30/2019 1136   NA 143 10/27/2017 1036   K 3.9 09/30/2019 1136   CL 99 09/30/2019 1136   CO2 29 09/30/2019 1136   GLUCOSE 115 (H) 09/30/2019 1136   BUN 16 09/30/2019 1136   BUN 10 10/27/2017 1036   CREATININE 1.11 09/30/2019 1136   CREATININE 1.03 04/05/2018 0904   CALCIUM 8.4 (L) 09/30/2019 1136   PROT 7.3 07/29/2019 0016   PROT 7.4 10/27/2017 1036   ALBUMIN 3.5 07/29/2019 0016   ALBUMIN 4.4 10/27/2017 1036   AST 23 07/29/2019 0016   ALT 19 07/29/2019 0016   ALKPHOS 89 07/29/2019 0016   BILITOT 0.7 07/29/2019 0016   BILITOT 0.5 10/27/2017 1036   GFRNONAA >60 09/30/2019 1136   GFRNONAA 75 04/05/2018 0904   GFRAA >60 09/30/2019 1136   GFRAA 87 04/05/2018 0904  Lab Results  Component Value Date   CHOL 222 (H) 04/05/2018   HDL 48 04/05/2018   LDLCALC 156 (H) 04/05/2018   TRIG 79 04/05/2018   CHOLHDL 4.6 04/05/2018   Lab Results  Component Value Date   HGBA1C 5.9 (H) 04/05/2018   Lab Results  Component Value Date   BTCYELYH90 931 07/25/2019   Lab Results  Component Value Date   TSH 0.857 07/24/2019      ASSESSMENT AND PLAN 69 y.o. year old male  has a past medical history of Degenerative joint disease, Dementia ([place]), Gout, Hemorrhoids,  Hyperlipidemia, Hypertension, Impaired glucose tolerance, and Premature atrial contractions. here with :  1.  Dementia with behavioral disturbance 2:  Daytime sleepiness  -Continue Aricept 10 mg daily -Patient unable to complete MMSE today -We will check blood work CBC, CMP and ammonia and Depakote level due to increased sleepiness. -Advised to keep follow-up appointment next month with his PCP  Patient will follow up with our office in 6 months or sooner if needed.   I spent 15 minutes with the patient. 50% of this time was spent reviewing plan of care   Ward Givens, MSN, NP-C 01/03/2020, 9:35 AM [facility] [address] [place], [place] [address] [phone]

## 2020-01-04 LAB — COMPREHENSIVE METABOLIC PANEL
ALT: 12 IU/L (ref 0–44)
AST: 15 IU/L (ref 0–40)
Albumin/Globulin Ratio: 1.1 — ABNORMAL LOW (ref 1.2–2.2)
Albumin: 3.5 g/dL — ABNORMAL LOW (ref 3.8–4.8)
Alkaline Phosphatase: 97 IU/L (ref 39–117)
BUN/Creatinine Ratio: 15 (ref 10–24)
BUN: 17 mg/dL (ref 8–27)
Bilirubin Total: 0.2 mg/dL (ref 0.0–1.2)
CO2: 24 mmol/L (ref 20–29)
Calcium: 9.1 mg/dL (ref 8.6–10.2)
Chloride: 109 mmol/L — ABNORMAL HIGH (ref 96–106)
Creatinine, Ser: 1.15 mg/dL (ref 0.76–1.27)
GFR calc Af Amer: 75 mL/min/{1.73_m2} (ref >59)
GFR calc non Af Amer: 65 mL/min/{1.73_m2} (ref >59)
Globulin, Total: 3.1 g/dL (ref 1.5–4.5)
Glucose: 109 mg/dL — ABNORMAL HIGH (ref 65–99)
Potassium: 4.9 mmol/L (ref 3.5–5.2)
Sodium: 145 mmol/L — ABNORMAL HIGH (ref 134–144)
Total Protein: 6.6 g/dL (ref 6.0–8.5)

## 2020-01-04 LAB — CBC WITH DIFFERENTIAL/PLATELET
Basophils Absolute: 0 10*3/uL (ref 0.0–0.2)
Basos: 1 %
EOS (ABSOLUTE): 0.1 10*3/uL (ref 0.0–0.4)
Eos: 2 %
Hematocrit: 37.6 % (ref 37.5–51.0)
Hemoglobin: 12.5 g/dL — ABNORMAL LOW (ref 13.0–17.7)
Immature Grans (Abs): 0 10*3/uL (ref 0.0–0.1)
Immature Granulocytes: 1 %
Lymphocytes Absolute: 1.5 10*3/uL (ref 0.7–3.1)
Lymphs: 36 %
MCH: 29.9 pg (ref 26.6–33.0)
MCHC: 33.2 g/dL (ref 31.5–35.7)
MCV: 90 fL (ref 79–97)
Monocytes Absolute: 0.4 10*3/uL (ref 0.1–0.9)
Monocytes: 10 %
Neutrophils Absolute: 2 10*3/uL (ref 1.4–7.0)
Neutrophils: 50 %
Platelets: 217 10*3/uL (ref 150–450)
RBC: 4.18 x10E6/uL (ref 4.14–5.80)
RDW: 13.6 % (ref 11.6–15.4)
WBC: 4 10*3/uL (ref 3.4–10.8)

## 2020-01-04 LAB — VALPROIC ACID LEVEL: Valproic Acid Lvl: 31 ug/mL — ABNORMAL LOW (ref 50–100)

## 2020-01-04 LAB — AMMONIA: Ammonia: 72 ug/dL (ref 40–200)

## 2020-01-09 ENCOUNTER — Telehealth: Payer: Self-pay | Admitting: *Deleted

## 2020-01-09 NOTE — Telephone Encounter (Signed)
-----   Message from Ward Givens, NP sent at 01/04/2020  4:52 PM EST ----- Labs results are fairly unremarkable. Please call patient with results.

## 2020-01-09 NOTE — Telephone Encounter (Signed)
Mail service not been established yet at home #.  I spoke to wife and gave her the results per MM/NP fairly unremarkable.  I relayed that the results were not totally normal, had some that were a little off, no repeat labs needed.  Has f/u with pcp, Dr. Moshe Cipro in several weeks.  She verbalized understanding and will call back as needed.

## 2020-01-24 ENCOUNTER — Ambulatory Visit: Payer: Medicare HMO | Admitting: Family Medicine

## 2020-01-26 ENCOUNTER — Telehealth: Payer: Self-pay

## 2020-01-26 ENCOUNTER — Other Ambulatory Visit: Payer: Self-pay | Admitting: Family Medicine

## 2020-01-26 MED ORDER — LORAZEPAM 0.5 MG PO TABS: ORAL_TABLET | ORAL | 1 refills | Status: AC

## 2020-01-26 NOTE — Telephone Encounter (Signed)
Pts wife is calling stating that the medication was called in wrong-he takes lorazapam 3 times a day --it was filled as 1 time daily --please call

## 2020-01-26 NOTE — Telephone Encounter (Signed)
I do not see lorazepam prescribed that way on his record, pls call the pharmacy to see most recent prescription he picked up and let me know

## 2020-01-26 NOTE — Telephone Encounter (Signed)
Per pharmacy, prescription picked up in December was 0.5mg  tid, one on 1/6 was 0.5mg  qd

## 2020-01-26 NOTE — Telephone Encounter (Signed)
Prescription is sent in

## 2020-01-27 NOTE — Telephone Encounter (Signed)
Advised spouse that new script has been called in.

## 2020-01-27 NOTE — Telephone Encounter (Signed)
Tried to call and let spouse know new prescription has been sent in. Call wouldn't go through.

## 2020-01-27 NOTE — Telephone Encounter (Signed)
Per the SP the pt received this medication from Dr. Wallace Going. Marvin [name] , a Psychiatrist in [place], [place], this Dr prescribed him the medication to take 3 times a day

## 2020-01-30 ENCOUNTER — Ambulatory Visit: Payer: Medicare HMO | Admitting: Family Medicine

## 2020-02-01 ENCOUNTER — Other Ambulatory Visit: Payer: Self-pay

## 2020-02-01 ENCOUNTER — Ambulatory Visit (INDEPENDENT_AMBULATORY_CARE_PROVIDER_SITE_OTHER): Payer: Medicare HMO | Admitting: Family Medicine

## 2020-02-01 ENCOUNTER — Encounter: Payer: Self-pay | Admitting: Family Medicine

## 2020-02-01 VITALS — BP 142/76 | HR 86 | Temp 97.8°F | Resp 15 | Ht 71.0 in | Wt 262.1 lb

## 2020-02-01 DIAGNOSIS — E790 Hyperuricemia without signs of inflammatory arthritis and tophaceous disease: Secondary | ICD-10-CM

## 2020-02-01 DIAGNOSIS — M109 Gout, unspecified: Secondary | ICD-10-CM | POA: Diagnosis not present

## 2020-02-01 DIAGNOSIS — E785 Hyperlipidemia, unspecified: Secondary | ICD-10-CM

## 2020-02-01 DIAGNOSIS — R7301 Impaired fasting glucose: Secondary | ICD-10-CM

## 2020-02-01 DIAGNOSIS — I10 Essential (primary) hypertension: Secondary | ICD-10-CM | POA: Diagnosis not present

## 2020-02-01 DIAGNOSIS — F0391 Unspecified dementia with behavioral disturbance: Secondary | ICD-10-CM | POA: Diagnosis not present

## 2020-02-01 DIAGNOSIS — Z23 Encounter for immunization: Secondary | ICD-10-CM | POA: Diagnosis not present

## 2020-02-01 DIAGNOSIS — E538 Deficiency of other specified B group vitamins: Secondary | ICD-10-CM | POA: Diagnosis not present

## 2020-02-01 DIAGNOSIS — R609 Edema, unspecified: Secondary | ICD-10-CM

## 2020-02-01 DIAGNOSIS — R69 Illness, unspecified: Secondary | ICD-10-CM | POA: Diagnosis not present

## 2020-02-01 DIAGNOSIS — E559 Vitamin D deficiency, unspecified: Secondary | ICD-10-CM | POA: Diagnosis not present

## 2020-02-01 MED ORDER — ESCITALOPRAM OXALATE 20 MG PO TABS: 20.0000 mg | ORAL_TABLET | Freq: Every day | ORAL | 1 refills | Status: AC

## 2020-02-01 MED ORDER — AMLODIPINE BESYLATE 10 MG PO TABS: 10.0000 mg | ORAL_TABLET | Freq: Every day | ORAL | 1 refills | Status: AC

## 2020-02-01 MED ORDER — DIVALPROEX SODIUM ER 500 MG PO TB24: 500.0000 mg | ORAL_TABLET | Freq: Every day | ORAL | 1 refills | Status: AC

## 2020-02-01 MED ORDER — ALLOPURINOL 300 MG PO TABS: 300.0000 mg | ORAL_TABLET | Freq: Every day | ORAL | 1 refills | Status: AC

## 2020-02-01 MED ORDER — TAMSULOSIN HCL 0.4 MG PO CAPS: 0.4000 mg | ORAL_CAPSULE | Freq: Every day | ORAL | 1 refills | Status: AC

## 2020-02-01 MED ORDER — OLANZAPINE 10 MG PO TABS: 10.0000 mg | ORAL_TABLET | Freq: Every day | ORAL | 1 refills | Status: AC

## 2020-02-01 MED ORDER — DONEPEZIL HCL 10 MG PO TABS: 10.0000 mg | ORAL_TABLET | Freq: Every day | ORAL | 1 refills | Status: AC

## 2020-02-01 NOTE — Assessment & Plan Note (Signed)
I would benefit from having a low-dose hydrochlorothiazide or Lasix on board to help with leg swelling as he is very aggressive about not having his legs up and elevated.  Will be getting assessment of blood pressure in the coming weeks.

## 2020-02-01 NOTE — Assessment & Plan Note (Signed)
Checking lab, might be able to reduce pill burden with stopping allopurinol. Wife would like to continue for now.

## 2020-02-01 NOTE — Assessment & Plan Note (Signed)
Getting updated labs ° °

## 2020-02-01 NOTE — Progress Notes (Signed)
Subjective:  Patient ID: Marvin [name], male    DOB: 11-Feb-1951  Age: 69 y.o. MRN: [id number]  CC:  Chief Complaint  Patient presents with  . Dementia    follow up  . Hypertension  . Hyperlipidemia      HPI  HPI  Marvin [name] is a 69 year old male patient of Dr. Griffin Dakin who is well-known to the clinic. 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  He sees neurology and has been seen by behavioral health as well.  Today he is here for 63-month follow-up regarding his dementia, hypertension anemia among others.  Wife is his main caregiver and she is here today she reports that he sleeps well and eats well.  His skin is dry and she tries to put Vaseline or petroleum jelly on it but there is no wounds that she can report.  She reports that he has accidents with urine and his bowel movements have been hit or miss as far as making it to the bathroom but she thinks is because when it is cold out he does not want to pull his pants down.  She reports that his confusion is where it is and is not been eating worse than previous.  She denies him having any falls.  She would like a refill on all of his medications today.  She reports that she has been giving him clonidine on her own even though that medication was stopped previously last year.  Unsure of true blood pressure reading given that she was giving him this medication and then it was stopped and then she started giving it again.  Would benefit from some updated labs.  Today patient denies signs and symptoms of COVID 19 infection including fever, chills, cough, shortness of breath, and headache. Past Medical, Surgical, Social History, Allergies, and Medications have been Reviewed.   Past Medical History:  Diagnosis Date  . Degenerative joint disease   . Degenerative joint disease   . Dementia ([place])   . Gout   . Hemorrhoids    Dr. Romona Curls  . Hyperlipidemia   . Hypertension   . Impaired glucose tolerance     . Memory loss 01/12/2017  . Metabolic syndrome X Q000111Q  . Premature atrial contractions   . Thrombocytopenia ([place]) 07/24/2019    Current Meds  Medication Sig  . allopurinol (ZYLOPRIM) 300 MG tablet Take 1 tablet (300 mg total) by mouth at bedtime.  Marland Kitchen amLODipine (NORVASC) 10 MG tablet Take 1 tablet (10 mg total) by mouth daily.  . cholecalciferol (VITAMIN D3) 25 MCG (1000 UT) tablet Take 1,000 Units by mouth daily.  . divalproex (DEPAKOTE ER) 500 MG 24 hr tablet Take 1 tablet (500 mg total) by mouth daily.  Marland Kitchen donepezil (ARICEPT) 10 MG tablet Take 1 tablet (10 mg total) by mouth at bedtime.  Marland Kitchen escitalopram (LEXAPRO) 20 MG tablet Take 1 tablet (20 mg total) by mouth daily.  Marland Kitchen LORazepam (ATIVAN) 0.5 MG tablet Take one tablet by mouth  Three times daily  . Melatonin 5 MG CAPS Take by mouth at bedtime.  Marland Kitchen OLANZapine (ZYPREXA) 10 MG tablet Take 1 tablet (10 mg total) by mouth at bedtime.  . simvastatin (ZOCOR) 80 MG tablet Take 1 tablet (80 mg total) by mouth daily at 6 PM.  . tamsulosin (FLOMAX) 0.4 MG CAPS capsule Take 1 capsule (0.4 mg total) by mouth daily.  . [DISCONTINUED] allopurinol (ZYLOPRIM) 300 MG tablet Take 1 tablet (300 mg total) by  mouth at bedtime.  . [DISCONTINUED] amLODipine (NORVASC) 10 MG tablet Take 1 tablet (10 mg total) by mouth daily.  . [DISCONTINUED] divalproex (DEPAKOTE ER) 500 MG 24 hr tablet Take 1 tablet (500 mg total) by mouth daily.  . [DISCONTINUED] donepezil (ARICEPT) 10 MG tablet Take 1 tablet (10 mg total) by mouth at bedtime.  . [DISCONTINUED] escitalopram (LEXAPRO) 20 MG tablet Take 1 tablet (20 mg total) by mouth daily.  . [DISCONTINUED] OLANZapine (ZYPREXA) 10 MG tablet Take 1 tablet (10 mg total) by mouth at bedtime.  . [DISCONTINUED] tamsulosin (FLOMAX) 0.4 MG CAPS capsule Take 1 capsule (0.4 mg total) by mouth daily.    ROS:  Review of Systems  Unable to perform ROS: Dementia (Wife is historian.)  Cardiovascular: Positive for leg swelling.   Psychiatric/Behavioral: Positive for memory loss.  All other systems reviewed and are negative.    Objective:   Today's Vitals: BP (!) 142/76   Pulse 86   Temp 97.8 F (36.6 C) (Temporal)   Resp 15   Ht 5\' 11"  (1.803 m)   Wt 262 lb 1.9 oz (118.9 kg)   SpO2 95%   BMI 36.56 kg/m  Vitals with BMI 02/01/2020 01/03/2020 11/29/2019  Height 5\' 11"  5\' 11"  5\' 11"   Weight 262 lbs 2 oz 274 lbs 6 oz 260 lbs 6 oz  BMI 36.57 [id number]  Systolic [id number] [id number]  Diastolic 76 80 80  Pulse 86 81 86     Physical Exam Vitals and nursing note reviewed.  Constitutional:      Appearance: Normal appearance. He is well-developed and well-groomed. He is obese.  HENT:     Head: Normocephalic and atraumatic.     Right Ear: External ear normal.     Left Ear: External ear normal.     Nose: Nose normal.     Mouth/Throat:     Mouth: Mucous membranes are moist.     Pharynx: Oropharynx is clear.  Eyes:     General:        Right eye: No discharge.        Left eye: No discharge.     Conjunctiva/sclera: Conjunctivae normal.  Cardiovascular:     Rate and Rhythm: Normal rate and regular rhythm.     Pulses: Normal pulses.     Heart sounds: Normal heart sounds.  Pulmonary:     Effort: Pulmonary effort is normal.     Breath sounds: Normal breath sounds.  Musculoskeletal:        General: Normal range of motion.     Cervical back: Normal range of motion and neck supple.     Right lower leg: 1+ Edema present.     Left lower leg: 1+ Edema present.  Skin:    General: Skin is warm.  Neurological:     General: No focal deficit present.     Mental Status: He is alert and oriented to person, place, and time.  Psychiatric:        Attention and Perception: He is inattentive.        Mood and Affect: Affect is flat.        Speech: He is noncommunicative.        Behavior: Behavior is withdrawn.        Cognition and Memory: Cognition is impaired. Memory is impaired.     Assessment   1. Dementia with  behavioral disturbance, unspecified dementia type ([place])   2. Essential hypertension   3. Need for immunization against  influenza   4. Gout, unspecified cause, unspecified chronicity, unspecified site   5. Vitamin D deficiency   6. Vitamin B12 deficiency   7. Hyperlipidemia, unspecified hyperlipidemia type   8. Impaired fasting glucose   9. Hyperuricemia   10. 2+ pitting edema BLE     Tests ordered Orders Placed This Encounter  Procedures  . Flu Vaccine QUAD High Dose(Fluad)  . Lipid panel  . Hemoglobin A1c  . VITAMIN D 25 Hydroxy (Vit-D Deficiency, Fractures)  . Vitamin B12     Plan: Please see assessment and plan per problem list above.   Meds ordered this encounter  Medications  . allopurinol (ZYLOPRIM) 300 MG tablet    Sig: Take 1 tablet (300 mg total) by mouth at bedtime.    Dispense:  30 tablet    Refill:  1    Order Specific Question:   Supervising Provider    Answer:   SIMPSON, MARGARET E [id number]  . amLODipine (NORVASC) 10 MG tablet    Sig: Take 1 tablet (10 mg total) by mouth daily.    Dispense:  30 tablet    Refill:  1    Order Specific Question:   Supervising Provider    Answer:   SIMPSON, MARGARET E [id number]  . divalproex (DEPAKOTE ER) 500 MG 24 hr tablet    Sig: Take 1 tablet (500 mg total) by mouth daily.    Dispense:  30 tablet    Refill:  1    Order Specific Question:   Supervising Provider    Answer:   SIMPSON, MARGARET E [id number]  . donepezil (ARICEPT) 10 MG tablet    Sig: Take 1 tablet (10 mg total) by mouth at bedtime.    Dispense:  30 tablet    Refill:  1    Order Specific Question:   Supervising Provider    Answer:   SIMPSON, MARGARET E [id number]  . escitalopram (LEXAPRO) 20 MG tablet    Sig: Take 1 tablet (20 mg total) by mouth daily.    Dispense:  30 tablet    Refill:  1    Order Specific Question:   Supervising Provider    Answer:   SIMPSON, MARGARET E [id number]  . OLANZapine (ZYPREXA) 10 MG tablet    Sig: Take 1 tablet (10 mg total) by mouth  at bedtime.    Dispense:  30 tablet    Refill:  1    Order Specific Question:   Supervising Provider    Answer:   SIMPSON, MARGARET E [id number]  . tamsulosin (FLOMAX) 0.4 MG CAPS capsule    Sig: Take 1 capsule (0.4 mg total) by mouth daily.    Dispense:  30 capsule    Refill:  1    Order Specific Question:   Supervising Provider    Answer:   Jacklynn Bue    Patient to follow-up in 04/30/2020  Perlie Mayo, NP

## 2020-02-01 NOTE — Patient Instructions (Addendum)
Happy New Year! May you have a year filled with hope, love, happiness and laughter.  I appreciate the opportunity to provide you with care for your health and wellness. Today we discussed: overall health   Follow up: 3 months   Labs today  No referrals today  Please consider increasing protein intake with a protein shake.  Medications refilled outside of Zocor to check cholesterol levels first.  Aveeno- Dimethicone (active ingredient) Can get the [employer] brand   Please continue to practice social distancing to keep you, your family, and our community safe.  If you must go out, please wear a mask and practice good handwashing.  It was a pleasure to see you and I look forward to continuing to work together on your health and well-being. Please do not hesitate to call the office if you need care or have questions about your care.  Have a wonderful day and week. With Gratitude, Cherly Beach, DNP, AGNP-BC

## 2020-02-01 NOTE — Assessment & Plan Note (Signed)
Stays at home, wife is attentive, might need placement in near future due to increasing care needs.

## 2020-02-01 NOTE — Assessment & Plan Note (Signed)
Getting updated labs, on high dose zocor she is unsure when that was started, would like to reduce or see what his levels are now and adjust as appropriate.

## 2020-02-01 NOTE — Assessment & Plan Note (Signed)
Getting updated labs will address as needed and treat as appropriate.

## 2020-02-01 NOTE — Assessment & Plan Note (Signed)
Checking labs possible ability to reduce pill burden by dropping allopurinol.

## 2020-02-01 NOTE — Assessment & Plan Note (Signed)

## 2020-02-01 NOTE — Assessment & Plan Note (Signed)
Marvin [name] is encouraged to maintain a well balanced diet that is low in salt. Unsure of true controlled, continue current medication regimen.  No refills needed. Will have follow up appt to check BP at home and see if controlled before starting clonidine back. Would benefit from fluid pill more with leg swelling due to dependence and hard time getting him to lift his legs.   Additionally, he is also reminded that exercise is beneficial for heart health and control of  Blood pressure. 30-60 minutes daily is recommended-walking was suggested.

## 2020-02-02 LAB — HEMOGLOBIN A1C
Hgb A1c MFr Bld: 6.5 % of total Hgb — ABNORMAL HIGH (ref <5.7)
Mean Plasma Glucose: 140 (calc)
eAG (mmol/L): 7.7 (calc)

## 2020-02-02 LAB — VITAMIN D 25 HYDROXY (VIT D DEFICIENCY, FRACTURES): Vit D, 25-Hydroxy: 31 ng/mL (ref 30–100)

## 2020-02-02 LAB — LIPID PANEL
Cholesterol: 126 mg/dL (ref <200)
HDL: 40 mg/dL (ref >=40)
LDL Cholesterol (Calc): 70 mg/dL (calc)
Non-HDL Cholesterol (Calc): 86 mg/dL (calc) (ref <130)
Total CHOL/HDL Ratio: 3.2 (calc) (ref <5.0)
Triglycerides: 82 mg/dL (ref <150)

## 2020-02-02 LAB — VITAMIN B12: Vitamin B-12: 372 pg/mL (ref 200–1100)

## 2020-02-07 ENCOUNTER — Other Ambulatory Visit: Payer: Self-pay | Admitting: Family Medicine

## 2020-02-07 DIAGNOSIS — E785 Hyperlipidemia, unspecified: Secondary | ICD-10-CM

## 2020-02-07 DIAGNOSIS — R609 Edema, unspecified: Secondary | ICD-10-CM

## 2020-02-07 DIAGNOSIS — I10 Essential (primary) hypertension: Secondary | ICD-10-CM

## 2020-02-07 MED ORDER — HYDROCHLOROTHIAZIDE 12.5 MG PO TABS: 12.5000 mg | ORAL_TABLET | Freq: Every day | ORAL | 1 refills | Status: DC

## 2020-02-07 MED ORDER — SIMVASTATIN 40 MG PO TABS: 40.0000 mg | ORAL_TABLET | Freq: Every day | ORAL | 1 refills | Status: AC

## 2020-02-10 ENCOUNTER — Other Ambulatory Visit: Payer: Self-pay

## 2020-02-10 DIAGNOSIS — R609 Edema, unspecified: Secondary | ICD-10-CM

## 2020-02-10 DIAGNOSIS — E785 Hyperlipidemia, unspecified: Secondary | ICD-10-CM

## 2020-02-10 DIAGNOSIS — I10 Essential (primary) hypertension: Secondary | ICD-10-CM

## 2020-02-10 MED ORDER — HYDROCHLOROTHIAZIDE 12.5 MG PO TABS: 12.5000 mg | ORAL_TABLET | Freq: Every day | ORAL | 1 refills | Status: DC

## 2020-02-28 ENCOUNTER — Other Ambulatory Visit: Payer: Self-pay

## 2020-02-28 ENCOUNTER — Encounter: Payer: Self-pay | Admitting: Family Medicine

## 2020-02-28 ENCOUNTER — Ambulatory Visit (INDEPENDENT_AMBULATORY_CARE_PROVIDER_SITE_OTHER): Payer: Medicare HMO | Admitting: Family Medicine

## 2020-02-28 VITALS — BP 142/76 | Ht 71.0 in | Wt 262.0 lb

## 2020-02-28 DIAGNOSIS — F0391 Unspecified dementia with behavioral disturbance: Secondary | ICD-10-CM

## 2020-02-28 DIAGNOSIS — R69 Illness, unspecified: Secondary | ICD-10-CM | POA: Diagnosis not present

## 2020-02-28 NOTE — Patient Instructions (Signed)
I appreciate the opportunity to provide you with care for your health and wellness. Today we discussed: need for higher level of care  Follow up: as scheduled, if not in facility   Placement discussed.  Please reach out to your provider to get the care you need during his transition.  Please continue to practice social distancing to keep you, your family, and our community safe.  If you must go out, please wear a mask and practice good handwashing.  It was a pleasure to see you and I look forward to continuing to work together on your health and well-being. Please do not hesitate to call the office if you need care or have questions about your care.  Have a wonderful day and week. With Gratitude, Cherly Beach, DNP, AGNP-BC

## 2020-02-28 NOTE — Progress Notes (Signed)
Virtual Visit via Telephone Note   This visit type was conducted due to national recommendations for restrictions regarding the COVID-19 Pandemic (e.g. social distancing) in an effort to limit this patient's exposure and mitigate transmission in our community.  Due to his co-morbid illnesses, this patient is at least at moderate risk for complications without adequate follow up.  This format is felt to be most appropriate for this patient at this time.  The patient did not have access to video technology/had technical difficulties with video requiring transitioning to audio format only (telephone).  All issues noted in this document were discussed and addressed.  No physical exam could be performed with this format.    Evaluation Performed:  Follow-up visit  Date:  02/28/2020   ID:  Marvin [name], DOB Apr 19, 1951, MRN [id number]  Patient Location: Home Provider Location: Office  Location of Patient: Home Location of Provider: Telehealth Consent was obtain for visit to be over via telehealth. I verified that I am speaking with the correct person using two identifiers.  PCP:  Fayrene Helper, MD   Chief Complaint:  Getting harder to care for at home.  History of Present Illness:    Marvin [name] is a 69 y.o. male with early diagnosis of dementia with behavioral disturbance.  He sees neurology and has been seen by behavioral health as well. He was doing okay, but over the last month it has become increasing hard for his wife to care for him. She reports multiple accidents of bladder and bowel daily. Eating has decreased. Confusion has increased. She denies any falls at this time. Overall, his wife reports that it is much harder to care for him on her own. She would like placement for his safety.   The patient does not have symptoms concerning for COVID-19 infection (fever, chills, cough, or new shortness of breath).   Past Medical, Surgical, Social History, Allergies, and  Medications have been Reviewed.  Past Medical History:  Diagnosis Date  . Degenerative joint disease   . Degenerative joint disease   . Dementia ([place])   . Gout   . Hemorrhoids    Dr. Romona Curls  . Hyperlipidemia   . Hypertension   . Impaired glucose tolerance   . Memory loss 01/12/2017  . Metabolic syndrome X Q000111Q  . Premature atrial contractions   . Thrombocytopenia ([place]) 07/24/2019   Past Surgical History:  Procedure Laterality Date  . COLONOSCOPY  07/2004  . COLONOSCOPY N/A 09/22/2014   Procedure: COLONOSCOPY;  Surgeon: Daneil Dolin, MD;  Location: [facility];  Service: Endoscopy;  Laterality: N/A;  9:30 AM        PT REQUEST TIME  . KNEE ARTHROSCOPY     Right     Current Meds  Medication Sig  . allopurinol (ZYLOPRIM) 300 MG tablet Take 1 tablet (300 mg total) by mouth at bedtime.  Marland Kitchen amLODipine (NORVASC) 10 MG tablet Take 1 tablet (10 mg total) by mouth daily.  . cholecalciferol (VITAMIN D3) 25 MCG (1000 UT) tablet Take 1,000 Units by mouth daily.  . divalproex (DEPAKOTE ER) 500 MG 24 hr tablet Take 1 tablet (500 mg total) by mouth daily.  Marland Kitchen donepezil (ARICEPT) 10 MG tablet Take 1 tablet (10 mg total) by mouth at bedtime.  Marland Kitchen escitalopram (LEXAPRO) 20 MG tablet Take 1 tablet (20 mg total) by mouth daily.  . hydrochlorothiazide (HYDRODIURIL) 12.5 MG tablet Take 1 tablet (12.5 mg total) by mouth daily.  Marland Kitchen LORazepam (  ATIVAN) 0.5 MG tablet Take one tablet by mouth  Three times daily  . Melatonin 5 MG CAPS Take by mouth at bedtime.  Marland Kitchen OLANZapine (ZYPREXA) 10 MG tablet Take 1 tablet (10 mg total) by mouth at bedtime.  . simvastatin (ZOCOR) 40 MG tablet Take 1 tablet (40 mg total) by mouth daily at 6 PM.  . tamsulosin (FLOMAX) 0.4 MG CAPS capsule Take 1 capsule (0.4 mg total) by mouth daily.     Allergies:   Aspirin, Namenda [memantine hcl], Naprosyn [naproxen], and Penicillins   ROS:   Please see the history of present illness.    All other systems reviewed and are  negative.   Labs/Other Tests and Data Reviewed:    Recent Labs: 07/24/2019: Magnesium 1.9; TSH 0.857 01/03/2020: ALT 12; BUN 17; Creatinine, Ser 1.15; Hemoglobin 12.5; Platelets 217; Potassium 4.9; Sodium 145   Recent Lipid Panel Lab Results  Component Value Date/Time   CHOL 126 02/01/2020 10:43 AM   TRIG 82 02/01/2020 10:43 AM   HDL 40 02/01/2020 10:43 AM   CHOLHDL 3.2 02/01/2020 10:43 AM   LDLCALC 70 02/01/2020 10:43 AM    Wt Readings from Last 3 Encounters:  02/28/20 262 lb (118.8 kg)  02/01/20 262 lb 1.9 oz (118.9 kg)  01/03/20 274 lb 6.4 oz (124.5 kg)     Objective:    Vital Signs:  BP (!) 142/76   Ht 5\' 11"  (1.803 m)   Wt 262 lb (118.8 kg)   BMI 36.54 kg/m    Unable to assess due to dementia, wife is historian   ASSESSMENT & PLAN:    1. Dementia with behavioral disturbance, unspecified dementia type ([place])   Time:   Today, I have spent 15 minutes with the patient with telehealth technology discussing the above problems.     Medication Adjustments/Labs and Tests Ordered: Current medicines are reviewed at length with the patient today.  Concerns regarding medicines are outlined above.   Tests Ordered: No orders of the defined types were placed in this encounter.   Medication Changes: No orders of the defined types were placed in this encounter.   Disposition:  Follow up  04/30/2020 Signed, Perlie Mayo, NP  02/28/2020 9:58 AM     [facility]

## 2020-02-28 NOTE — Assessment & Plan Note (Signed)
As previously reported last month, his care needs have supersedes his wife's abilities and she is asking for help with placement. We will help with this. Encourage her to reach out to her provider to get assistants handling this transition.

## 2020-03-05 ENCOUNTER — Ambulatory Visit: Payer: Medicare HMO | Admitting: Family Medicine

## 2020-03-05 ENCOUNTER — Telehealth: Payer: Self-pay | Admitting: *Deleted

## 2020-03-05 NOTE — Telephone Encounter (Signed)
Completed FMLA for wife, caregiver of pt.  To MM/NP for signature then to fax.

## 2020-03-06 NOTE — Telephone Encounter (Signed)
Signed form this am.  I faxed the form to the [facility]. [phone] fax received confirmation.  To MR for scan.  (other form request to postpone certification due to impossibility to receive HCP certification.    attached was completed, needs signature, pts wife not sure if needed this).  It there if needed, just needs signature.

## 2020-03-12 ENCOUNTER — Telehealth: Payer: Self-pay | Admitting: *Deleted

## 2020-03-12 NOTE — Telephone Encounter (Signed)
Pt wife called, need a correction made on fmla form. Please call [phone]

## 2020-03-12 NOTE — Telephone Encounter (Signed)
I called wife.  She stated they are stating that she is only requiring 2 days per year, 2 hours each.  She will see specifically where they need it it so sign this and let me know.

## 2020-03-14 ENCOUNTER — Ambulatory Visit: Payer: Medicare HMO | Admitting: Family Medicine

## 2020-03-15 NOTE — Telephone Encounter (Signed)
I called and could not LM for pts wife relating to the number of days per month.  Per MM/NP will only give 4 days// month frequency with  Duration of 1 day each.  Will forward to DS/in MR to call wife and let her know.  If pt has decline, and is requiring more assistance this can be re addressed at that time.  Form in out box.

## 2020-03-25 ENCOUNTER — Other Ambulatory Visit: Payer: Self-pay | Admitting: Family Medicine

## 2020-03-29 NOTE — Telephone Encounter (Signed)
Requesting refill

## 2020-04-02 ENCOUNTER — Telehealth: Payer: Self-pay | Admitting: Family Medicine

## 2020-04-02 ENCOUNTER — Other Ambulatory Visit: Payer: Self-pay | Admitting: Family Medicine

## 2020-04-02 MED ORDER — LORAZEPAM 0.5 MG PO TABS: ORAL_TABLET | ORAL | 0 refills | Status: DC

## 2020-04-02 NOTE — Telephone Encounter (Signed)
Pt needs to submit UDS (to screen for lorazepam,) pls let wife know. I have prescribed one month only

## 2020-04-02 NOTE — Telephone Encounter (Signed)
LVM for pt wife to call the office

## 2020-04-03 NOTE — Telephone Encounter (Signed)
Faxed fl2 in to [facility]

## 2020-04-09 ENCOUNTER — Telehealth: Payer: Self-pay

## 2020-04-09 NOTE — Telephone Encounter (Signed)
FL2 form Copied  Noted  cleeved

## 2020-04-11 DIAGNOSIS — M6281 Muscle weakness (generalized): Secondary | ICD-10-CM | POA: Diagnosis not present

## 2020-04-11 DIAGNOSIS — R1311 Dysphagia, oral phase: Secondary | ICD-10-CM | POA: Diagnosis not present

## 2020-04-11 DIAGNOSIS — R41841 Cognitive communication deficit: Secondary | ICD-10-CM | POA: Diagnosis not present

## 2020-04-11 DIAGNOSIS — R69 Illness, unspecified: Secondary | ICD-10-CM | POA: Diagnosis not present

## 2020-04-12 DIAGNOSIS — E785 Hyperlipidemia, unspecified: Secondary | ICD-10-CM | POA: Diagnosis not present

## 2020-04-12 DIAGNOSIS — R1311 Dysphagia, oral phase: Secondary | ICD-10-CM | POA: Diagnosis not present

## 2020-04-12 DIAGNOSIS — I498 Other specified cardiac arrhythmias: Secondary | ICD-10-CM | POA: Diagnosis not present

## 2020-04-12 DIAGNOSIS — R41841 Cognitive communication deficit: Secondary | ICD-10-CM | POA: Diagnosis not present

## 2020-04-12 DIAGNOSIS — M6281 Muscle weakness (generalized): Secondary | ICD-10-CM | POA: Diagnosis not present

## 2020-04-12 DIAGNOSIS — I10 Essential (primary) hypertension: Secondary | ICD-10-CM | POA: Diagnosis not present

## 2020-04-12 DIAGNOSIS — M109 Gout, unspecified: Secondary | ICD-10-CM | POA: Diagnosis not present

## 2020-04-12 DIAGNOSIS — R69 Illness, unspecified: Secondary | ICD-10-CM | POA: Diagnosis not present

## 2020-04-13 DIAGNOSIS — R69 Illness, unspecified: Secondary | ICD-10-CM | POA: Diagnosis not present

## 2020-04-13 DIAGNOSIS — R41841 Cognitive communication deficit: Secondary | ICD-10-CM | POA: Diagnosis not present

## 2020-04-13 DIAGNOSIS — R1311 Dysphagia, oral phase: Secondary | ICD-10-CM | POA: Diagnosis not present

## 2020-04-13 DIAGNOSIS — M6281 Muscle weakness (generalized): Secondary | ICD-10-CM | POA: Diagnosis not present

## 2020-04-14 DIAGNOSIS — R69 Illness, unspecified: Secondary | ICD-10-CM | POA: Diagnosis not present

## 2020-04-17 DIAGNOSIS — E7800 Pure hypercholesterolemia, unspecified: Secondary | ICD-10-CM | POA: Diagnosis not present

## 2020-04-17 DIAGNOSIS — E782 Mixed hyperlipidemia: Secondary | ICD-10-CM | POA: Diagnosis not present

## 2020-04-17 DIAGNOSIS — E559 Vitamin D deficiency, unspecified: Secondary | ICD-10-CM | POA: Diagnosis not present

## 2020-04-17 DIAGNOSIS — D649 Anemia, unspecified: Secondary | ICD-10-CM | POA: Diagnosis not present

## 2020-04-17 DIAGNOSIS — E119 Type 2 diabetes mellitus without complications: Secondary | ICD-10-CM | POA: Diagnosis not present

## 2020-04-17 DIAGNOSIS — D518 Other vitamin B12 deficiency anemias: Secondary | ICD-10-CM | POA: Diagnosis not present

## 2020-04-17 DIAGNOSIS — Z79899 Other long term (current) drug therapy: Secondary | ICD-10-CM | POA: Diagnosis not present

## 2020-04-17 DIAGNOSIS — R69 Illness, unspecified: Secondary | ICD-10-CM | POA: Diagnosis not present

## 2020-04-17 DIAGNOSIS — E039 Hypothyroidism, unspecified: Secondary | ICD-10-CM | POA: Diagnosis not present

## 2020-04-18 DIAGNOSIS — R69 Illness, unspecified: Secondary | ICD-10-CM | POA: Diagnosis not present

## 2020-04-19 DIAGNOSIS — R69 Illness, unspecified: Secondary | ICD-10-CM | POA: Diagnosis not present

## 2020-04-20 DIAGNOSIS — R69 Illness, unspecified: Secondary | ICD-10-CM | POA: Diagnosis not present

## 2020-04-24 DIAGNOSIS — R69 Illness, unspecified: Secondary | ICD-10-CM | POA: Diagnosis not present

## 2020-04-25 DIAGNOSIS — R69 Illness, unspecified: Secondary | ICD-10-CM | POA: Diagnosis not present

## 2020-04-26 DIAGNOSIS — R69 Illness, unspecified: Secondary | ICD-10-CM | POA: Diagnosis not present

## 2020-04-27 DIAGNOSIS — R69 Illness, unspecified: Secondary | ICD-10-CM | POA: Diagnosis not present

## 2020-04-28 DIAGNOSIS — R69 Illness, unspecified: Secondary | ICD-10-CM | POA: Diagnosis not present

## 2020-04-29 ENCOUNTER — Emergency Department (HOSPITAL_COMMUNITY): Payer: Medicare HMO

## 2020-04-29 ENCOUNTER — Other Ambulatory Visit: Payer: Self-pay

## 2020-04-29 ENCOUNTER — Emergency Department (HOSPITAL_COMMUNITY)
Admission: EM | Admit: 2020-04-29 | Discharge: 2020-04-30 | Disposition: A | Discharge status: Skilled Nursing Facility | Payer: Medicare HMO | Attending: Emergency Medicine | Admitting: Emergency Medicine

## 2020-04-29 ENCOUNTER — Encounter (HOSPITAL_COMMUNITY): Payer: Self-pay

## 2020-04-29 DIAGNOSIS — E8881 Metabolic syndrome: Secondary | ICD-10-CM | POA: Insufficient documentation

## 2020-04-29 DIAGNOSIS — F0391 Unspecified dementia with behavioral disturbance: Secondary | ICD-10-CM | POA: Insufficient documentation

## 2020-04-29 DIAGNOSIS — J9811 Atelectasis: Secondary | ICD-10-CM | POA: Diagnosis not present

## 2020-04-29 DIAGNOSIS — Z046 Encounter for general psychiatric examination, requested by authority: Secondary | ICD-10-CM | POA: Diagnosis not present

## 2020-04-29 DIAGNOSIS — I4891 Unspecified atrial fibrillation: Secondary | ICD-10-CM | POA: Diagnosis not present

## 2020-04-29 DIAGNOSIS — Z79899 Other long term (current) drug therapy: Secondary | ICD-10-CM | POA: Diagnosis not present

## 2020-04-29 DIAGNOSIS — I10 Essential (primary) hypertension: Secondary | ICD-10-CM | POA: Insufficient documentation

## 2020-04-29 DIAGNOSIS — R456 Violent behavior: Secondary | ICD-10-CM | POA: Insufficient documentation

## 2020-04-29 DIAGNOSIS — R4689 Other symptoms and signs involving appearance and behavior: Secondary | ICD-10-CM

## 2020-04-29 DIAGNOSIS — R69 Illness, unspecified: Secondary | ICD-10-CM | POA: Diagnosis not present

## 2020-04-29 LAB — CBC
HCT: 42.9 % (ref 39.0–52.0)
Hemoglobin: 13.8 g/dL (ref 13.0–17.0)
MCH: 28.6 pg (ref 26.0–34.0)
MCHC: 32.2 g/dL (ref 30.0–36.0)
MCV: 89 fL (ref 80.0–100.0)
Platelets: 231 10*3/uL (ref 150–400)
RBC: 4.82 MIL/uL (ref 4.22–5.81)
RDW: 16.7 % — ABNORMAL HIGH (ref 11.5–15.5)
WBC: 4 10*3/uL (ref 4.0–10.5)
nRBC: 0 % (ref 0.0–0.2)

## 2020-04-29 LAB — COMPREHENSIVE METABOLIC PANEL
ALT: 22 U/L (ref 0–44)
AST: 22 U/L (ref 15–41)
Albumin: 3.6 g/dL (ref 3.5–5.0)
Alkaline Phosphatase: 91 U/L (ref 38–126)
Anion gap: 8 (ref 5–15)
BUN: 17 mg/dL (ref 8–23)
CO2: 23 mmol/L (ref 22–32)
Calcium: 8.7 mg/dL — ABNORMAL LOW (ref 8.9–10.3)
Chloride: 101 mmol/L (ref 98–111)
Creatinine, Ser: 1.16 mg/dL (ref 0.61–1.24)
GFR calc Af Amer: >60 mL/min (ref >60)
GFR calc non Af Amer: >60 mL/min (ref >60)
Glucose, Bld: 125 mg/dL — ABNORMAL HIGH (ref 70–99)
Potassium: 3.9 mmol/L (ref 3.5–5.1)
Sodium: 132 mmol/L — ABNORMAL LOW (ref 135–145)
Total Bilirubin: 0.3 mg/dL (ref 0.3–1.2)
Total Protein: 7.8 g/dL (ref 6.5–8.1)

## 2020-04-29 LAB — VALPROIC ACID LEVEL: Valproic Acid Lvl: 30 ug/mL — ABNORMAL LOW (ref 50.0–100.0)

## 2020-04-29 LAB — ETHANOL: Alcohol, Ethyl (B): <10 mg/dL (ref <10)

## 2020-04-29 LAB — AMMONIA: Ammonia: 61 umol/L — ABNORMAL HIGH (ref 9–35)

## 2020-04-29 MED ORDER — DIVALPROEX SODIUM ER 500 MG PO TB24
500.0000 mg | ORAL_TABLET | Freq: Every day | ORAL | Status: DC
  Administered 2020-04-30: 500 mg via ORAL
  Filled 2020-04-29: qty 1

## 2020-04-29 MED ORDER — SIMVASTATIN 10 MG PO TABS
40.0000 mg | ORAL_TABLET | Freq: Every day | ORAL | Status: DC
  Administered 2020-04-30: 40 mg via ORAL
  Filled 2020-04-29: qty 4

## 2020-04-29 MED ORDER — DONEPEZIL HCL 10 MG PO TABS
10.0000 mg | ORAL_TABLET | Freq: Every day | ORAL | Status: DC
  Administered 2020-04-30: 10 mg via ORAL
  Filled 2020-04-29 (×3): qty 1
  Filled 2020-04-29: qty 2

## 2020-04-29 MED ORDER — OLANZAPINE 5 MG PO TBDP
5.0000 mg | ORAL_TABLET | Freq: Once | ORAL | Status: AC
  Administered 2020-04-29: 5 mg via ORAL
  Filled 2020-04-29: qty 1

## 2020-04-29 MED ORDER — LORAZEPAM 1 MG PO TABS
1.0000 mg | ORAL_TABLET | Freq: Three times a day (TID) | ORAL | Status: DC
  Administered 2020-04-30 (×2): 1 mg via ORAL
  Filled 2020-04-29 (×2): qty 1

## 2020-04-29 MED ORDER — VITAMIN D3 25 MCG (1000 UNIT) PO TABS
1000.0000 [IU] | ORAL_TABLET | Freq: Every day | ORAL | Status: DC
  Filled 2020-04-29 (×3): qty 1

## 2020-04-29 MED ORDER — ESCITALOPRAM OXALATE 10 MG PO TABS
20.0000 mg | ORAL_TABLET | Freq: Every day | ORAL | Status: DC
  Administered 2020-04-30: 20 mg via ORAL
  Filled 2020-04-29: qty 2

## 2020-04-29 MED ORDER — TAMSULOSIN HCL 0.4 MG PO CAPS
0.4000 mg | ORAL_CAPSULE | Freq: Every day | ORAL | Status: DC
  Administered 2020-04-30: 0.4 mg via ORAL
  Filled 2020-04-29: qty 1

## 2020-04-29 MED ORDER — ALLOPURINOL 300 MG PO TABS
300.0000 mg | ORAL_TABLET | Freq: Every day | ORAL | Status: DC
  Administered 2020-04-30: 300 mg via ORAL
  Filled 2020-04-29 (×2): qty 1

## 2020-04-29 MED ORDER — AMLODIPINE BESYLATE 5 MG PO TABS
10.0000 mg | ORAL_TABLET | Freq: Every day | ORAL | Status: DC
  Administered 2020-04-30: 10 mg via ORAL
  Filled 2020-04-29: qty 2

## 2020-04-29 MED ORDER — FUROSEMIDE 40 MG PO TABS
20.0000 mg | ORAL_TABLET | Freq: Every day | ORAL | Status: DC
  Administered 2020-04-30: 20 mg via ORAL
  Filled 2020-04-29: qty 1

## 2020-04-29 MED ORDER — LORAZEPAM 2 MG/ML IJ SOLN
2.0000 mg | Freq: Once | INTRAMUSCULAR | Status: AC
  Administered 2020-04-29: 2 mg via INTRAMUSCULAR
  Filled 2020-04-29: qty 1
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
## 2020-04-29 NOTE — ED Notes (Signed)
Still unable to get labs  
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
## 2020-04-29 NOTE — ED Notes (Signed)
Pt sitting on chair watching baseball. Took PO med without difficulty
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
## 2020-04-29 NOTE — ED Notes (Addendum)
Gave pt dinner tray  

## 2020-04-29 NOTE — ED Notes (Signed)
Pt non-verbal during triage

## 2020-04-30 ENCOUNTER — Ambulatory Visit: Payer: Medicare HMO | Admitting: Family Medicine

## 2020-04-30 LAB — URINALYSIS, ROUTINE W REFLEX MICROSCOPIC
Bilirubin Urine: NEGATIVE
Glucose, UA: NEGATIVE mg/dL
Hgb urine dipstick: NEGATIVE
Ketones, ur: NEGATIVE mg/dL
Leukocytes,Ua: NEGATIVE
Nitrite: NEGATIVE
Protein, ur: NEGATIVE mg/dL
Specific Gravity, Urine: 1.008 (ref 1.005–1.030)
pH: 7 (ref 5.0–8.0)

## 2020-04-30 LAB — RAPID URINE DRUG SCREEN, HOSP PERFORMED
Amphetamines: NOT DETECTED
Barbiturates: NOT DETECTED
Benzodiazepines: NOT DETECTED
Cocaine: NOT DETECTED
Opiates: NOT DETECTED
Tetrahydrocannabinol: NOT DETECTED

## 2020-04-30 NOTE — BH Assessment (Signed)
[place] Assessment Progress Note  Case was staffed with [place] FNP who cleared the patient from psychiatry and recommended patient be seen by social work to assist with ongoing placement issues.

## 2020-04-30 NOTE — ED Notes (Signed)
Spoke with wife, [place] [phone].

## 2020-04-30 NOTE — ED Notes (Signed)
Pt asleep.
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
## 2020-04-30 NOTE — ED Provider Notes (Signed)
Blood pressure (!) 150/98, pulse 86, temperature 98.1 F (36.7 C), temperature source Oral, resp. rate 18, height 5\' 10"  (1.778 m), weight 106.1 kg, SpO2 99 %.   In short, Marvin [name] is a 69 y.o. male with a chief complaint of Psychiatric Evaluation .  Refer to the original H&P for additional details.  08:18 PM  Patient seen and evaluated by behavioral health.  He is psychiatrically clear at this time.  He has an irregular rhythm on EKG which may be atrial fibrillation.  Unknown if patient has this history or not. Not a good anticoagulation candidate with dementia history and fall risk. Will need PCP follow up for repeat EKG and discussion of symptoms.    EKG Interpretation  Date/Time:  Monday Apr 30 2020 19:36:24 EDT Ventricular Rate:  89 PR Interval:    QRS Duration: 86 QT Interval:  368 QTC Calculation: 447 R Axis:   37 Text Interpretation: Atrial fibrillation Low voltage QRS Septal infarct , age undetermined Abnormal ECG No STEMI Confirmed by Nanda Quinton [phone]) on 04/30/2020 8:05:48 PM          Shermar Friedland, [facility], MD 04/30/20 2023

## 2020-04-30 NOTE — Discharge Instructions (Signed)
Please follow with your primary care doctor in the coming week.  They would need to repeat some lab work and follow your EKG.  Your heart rate here is irregular and could reflect atrial fibrillation.  You need to have a risk/benefit discussion regarding being on blood thinners moving forward.  At this time I do not feel that the benefit of blood thinner is outweighed by the risk of falls or bleeding.

## 2020-04-30 NOTE — BHH Counselor (Signed)
Clinician spoke to [facility], RN and noted the pt is asleep; unable to engage in TTS assessment. Pt to be assessed once alert and able to engage.    Vertell Novak, MS, [facility], [facility] Triage Specialist [phone].

## 2020-04-30 NOTE — ED Notes (Signed)
Spoke with pt's wife Olin Hauser and informed her that her husband is being transferred back to [facility] and she was in agreement with the plan.

## 2020-04-30 NOTE — ED Notes (Signed)
Pt is unable to sign due to dementia.

## 2020-04-30 NOTE — Clinical Social Work Note (Signed)
Transition of Care [facility]) - Emergency Department Mini Assessment  Patient Details  Name: Marvin [name] MRN: [id number] Date of Birth: 1951/09/27  Transition of Care [facility]) CM/SW Contact:    Sherie Don, LCSW Phone Number: 04/30/2020, 7:59 PM  Clinical Narrative: Patient is a 69 year old male who presented in the ED for a psych evaluation. TOC received consult that patient was psych cleared and can return to [facility] today if possible. CSW called [employer] and spoke with 500 hall RN, Marthann Schiller. Per Ms. Reeder, patient is allowed to return today, but will need to be transported via ambulance. CSW notified ED RN that patient can go back tonight via EMS. CSW informed EMS will be called for patient. TOC signing off.  ED Mini Assessment: What brought you to the Emergency Department? : Psych evaluation Barriers to Discharge: Barriers Resolved Barrier interventions: EMS called; patient psych cleared Means of departure: Ambulance Interventions which prevented an admission or readmission: Transportation Screening, [place] return to placement after psych assessment  Patient Contact and Communications Key Contact 1: Marthann Schiller (RN for [employer]) Spoke with: Veneta Penton Date: 04/30/20,   Contact time: [place] Phone Number: [phone] Call outcome: Patient can return to [facility] via EMS tonight Patient states their goals for this hospitalization and ongoing recovery are:: Return to [facility]  Admission diagnosis:  Psych Eval Patient Active Problem List   Diagnosis Date Noted  . Gout 02/01/2020  . Need for immunization against influenza 02/01/2020  . Hypoglycemia 07/24/2019  . Cerebral atrophy ([place]) 07/24/2019  . Venous insufficiency of both lower extremities 07/24/2019  . 2+ pitting edema BLE 07/24/2019  . Hospital discharge follow-up 06/26/2019  . Dementia ([place]) 06/26/2019  . Dementia with behavioral problem ([place]) 06/26/2019  . Constipation  06/04/2019  . Elevated ALT measurement 06/03/2019  . Vitamin B12 deficiency 05/29/2019  . Vitamin D deficiency 05/24/2019  . Impaired fasting glucose 05/23/2019  . Prolonged QT interval 05/23/2019  . Prediabetes 04/13/2018  . Hyperuricemia 04/13/2018  . Obesity (BMI 30.0-34.9) 06/26/2013  . Hypertension   . Hyperlipidemia   . Premature atrial contractions   . IGT (impaired glucose tolerance) 02/07/2010   PCP:  Fayrene Helper, MD Pharmacy:   [facility] [address] [place] - [place] [address] [place] [address] Phone: [phone] Fax: [phone]  [facility] [address], [place] - [place] [address] [place] [address] [place] [address] Phone: [phone] Fax: [phone]  [place], [place] - [address] [place] [address] Phone: [phone] Fax: [phone]

## 2020-05-01 DIAGNOSIS — I10 Essential (primary) hypertension: Secondary | ICD-10-CM | POA: Diagnosis not present

## 2020-05-01 DIAGNOSIS — E785 Hyperlipidemia, unspecified: Secondary | ICD-10-CM | POA: Diagnosis not present

## 2020-05-01 DIAGNOSIS — R69 Illness, unspecified: Secondary | ICD-10-CM | POA: Diagnosis not present

## 2020-05-01 DIAGNOSIS — M109 Gout, unspecified: Secondary | ICD-10-CM | POA: Diagnosis not present

## 2020-05-02 DIAGNOSIS — F419 Anxiety disorder, unspecified: Secondary | ICD-10-CM | POA: Diagnosis not present

## 2020-05-02 DIAGNOSIS — F259 Schizoaffective disorder, unspecified: Secondary | ICD-10-CM | POA: Diagnosis not present

## 2020-05-02 DIAGNOSIS — R69 Illness, unspecified: Secondary | ICD-10-CM | POA: Diagnosis not present

## 2020-05-02 DIAGNOSIS — F339 Major depressive disorder, recurrent, unspecified: Secondary | ICD-10-CM | POA: Diagnosis not present

## 2020-05-16 DIAGNOSIS — F339 Major depressive disorder, recurrent, unspecified: Secondary | ICD-10-CM | POA: Diagnosis not present

## 2020-05-16 DIAGNOSIS — F419 Anxiety disorder, unspecified: Secondary | ICD-10-CM | POA: Diagnosis not present

## 2020-05-16 DIAGNOSIS — R69 Illness, unspecified: Secondary | ICD-10-CM | POA: Diagnosis not present

## 2020-05-16 DIAGNOSIS — F259 Schizoaffective disorder, unspecified: Secondary | ICD-10-CM | POA: Diagnosis not present

## 2020-05-18 DIAGNOSIS — M109 Gout, unspecified: Secondary | ICD-10-CM | POA: Diagnosis not present

## 2020-05-18 DIAGNOSIS — I498 Other specified cardiac arrhythmias: Secondary | ICD-10-CM | POA: Diagnosis not present

## 2020-05-18 DIAGNOSIS — I10 Essential (primary) hypertension: Secondary | ICD-10-CM | POA: Diagnosis not present

## 2020-05-18 DIAGNOSIS — R69 Illness, unspecified: Secondary | ICD-10-CM | POA: Diagnosis not present

## 2020-05-21 ENCOUNTER — Emergency Department (HOSPITAL_COMMUNITY): Payer: Medicare HMO

## 2020-05-21 ENCOUNTER — Emergency Department (HOSPITAL_COMMUNITY)
Admission: EM | Admit: 2020-05-21 | Discharge: 2020-05-25 | Disposition: A | Discharge status: Skilled Nursing Facility | Payer: Medicare HMO | Attending: Emergency Medicine | Admitting: Emergency Medicine

## 2020-05-21 ENCOUNTER — Encounter (HOSPITAL_COMMUNITY): Payer: Self-pay | Admitting: Emergency Medicine

## 2020-05-21 ENCOUNTER — Other Ambulatory Visit: Payer: Self-pay

## 2020-05-21 DIAGNOSIS — R4689 Other symptoms and signs involving appearance and behavior: Secondary | ICD-10-CM

## 2020-05-21 DIAGNOSIS — Z880 Allergy status to penicillin: Secondary | ICD-10-CM | POA: Diagnosis not present

## 2020-05-21 DIAGNOSIS — I10 Essential (primary) hypertension: Secondary | ICD-10-CM | POA: Diagnosis not present

## 2020-05-21 DIAGNOSIS — F918 Other conduct disorders: Secondary | ICD-10-CM | POA: Insufficient documentation

## 2020-05-21 DIAGNOSIS — R4182 Altered mental status, unspecified: Secondary | ICD-10-CM

## 2020-05-21 DIAGNOSIS — Z046 Encounter for general psychiatric examination, requested by authority: Secondary | ICD-10-CM | POA: Diagnosis not present

## 2020-05-21 DIAGNOSIS — Z008 Encounter for other general examination: Secondary | ICD-10-CM

## 2020-05-21 DIAGNOSIS — R456 Violent behavior: Secondary | ICD-10-CM | POA: Diagnosis not present

## 2020-05-21 DIAGNOSIS — Z886 Allergy status to analgesic agent status: Secondary | ICD-10-CM | POA: Diagnosis not present

## 2020-05-21 DIAGNOSIS — Z87891 Personal history of nicotine dependence: Secondary | ICD-10-CM | POA: Diagnosis not present

## 2020-05-21 DIAGNOSIS — R69 Illness, unspecified: Secondary | ICD-10-CM | POA: Diagnosis not present

## 2020-05-21 DIAGNOSIS — R519 Headache, unspecified: Secondary | ICD-10-CM | POA: Diagnosis not present

## 2020-05-21 LAB — COMPREHENSIVE METABOLIC PANEL
ALT: 20 U/L (ref 0–44)
AST: 17 U/L (ref 15–41)
Albumin: 3.5 g/dL (ref 3.5–5.0)
Alkaline Phosphatase: 85 U/L (ref 38–126)
Anion gap: 8 (ref 5–15)
BUN: 15 mg/dL (ref 8–23)
CO2: 25 mmol/L (ref 22–32)
Calcium: 8.8 mg/dL — ABNORMAL LOW (ref 8.9–10.3)
Chloride: 106 mmol/L (ref 98–111)
Creatinine, Ser: 1.13 mg/dL (ref 0.61–1.24)
GFR calc Af Amer: >60 mL/min (ref >60)
GFR calc non Af Amer: >60 mL/min (ref >60)
Glucose, Bld: 90 mg/dL (ref 70–99)
Potassium: 4.1 mmol/L (ref 3.5–5.1)
Sodium: 139 mmol/L (ref 135–145)
Total Bilirubin: 0.2 mg/dL — ABNORMAL LOW (ref 0.3–1.2)
Total Protein: 7.4 g/dL (ref 6.5–8.1)

## 2020-05-21 LAB — AMMONIA: Ammonia: 51 umol/L — ABNORMAL HIGH (ref 9–35)

## 2020-05-21 LAB — ETHANOL: Alcohol, Ethyl (B): <10 mg/dL (ref <10)

## 2020-05-21 LAB — VALPROIC ACID LEVEL: Valproic Acid Lvl: 33 ug/mL — ABNORMAL LOW (ref 50.0–100.0)

## 2020-05-21 MED ORDER — AMLODIPINE BESYLATE 5 MG PO TABS
10.0000 mg | ORAL_TABLET | Freq: Every day | ORAL | Status: DC
  Administered 2020-05-22 – 2020-05-25 (×4): 10 mg via ORAL
  Filled 2020-05-21 (×4): qty 2

## 2020-05-21 MED ORDER — DIPHENHYDRAMINE HCL 50 MG/ML IJ SOLN
25.0000 mg | Freq: Once | INTRAMUSCULAR | Status: AC
  Administered 2020-05-21: 25 mg via INTRAMUSCULAR
  Filled 2020-05-21: qty 1

## 2020-05-21 MED ORDER — DONEPEZIL HCL 5 MG PO TABS
ORAL_TABLET | ORAL | Status: AC
  Filled 2020-05-21: qty 1

## 2020-05-21 MED ORDER — ZIPRASIDONE MESYLATE 20 MG IM SOLR
10.0000 mg | Freq: Once | INTRAMUSCULAR | Status: AC
  Administered 2020-05-21: 10 mg via INTRAMUSCULAR
  Filled 2020-05-21: qty 20

## 2020-05-21 MED ORDER — TAMSULOSIN HCL 0.4 MG PO CAPS
0.4000 mg | ORAL_CAPSULE | Freq: Every day | ORAL | Status: DC
  Administered 2020-05-23 – 2020-05-25 (×3): 0.4 mg via ORAL
  Filled 2020-05-21 (×3): qty 1

## 2020-05-21 MED ORDER — OLANZAPINE 5 MG PO TABS
10.0000 mg | ORAL_TABLET | Freq: Every day | ORAL | Status: DC
  Administered 2020-05-22 – 2020-05-24 (×3): 10 mg via ORAL
  Filled 2020-05-21 (×4): qty 2

## 2020-05-21 MED ORDER — DONEPEZIL HCL 5 MG PO TABS
10.0000 mg | ORAL_TABLET | Freq: Every day | ORAL | Status: DC
  Administered 2020-05-21 – 2020-05-24 (×4): 10 mg via ORAL
  Filled 2020-05-21: qty 1
  Filled 2020-05-21 (×3): qty 2
  Filled 2020-05-21: qty 1
  Filled 2020-05-21 (×2): qty 2

## 2020-05-21 MED ORDER — HALOPERIDOL LACTATE 5 MG/ML IJ SOLN
5.0000 mg | Freq: Once | INTRAMUSCULAR | Status: AC
  Administered 2020-05-21: 5 mg via INTRAMUSCULAR
  Filled 2020-05-21: qty 1

## 2020-05-21 MED ORDER — LORAZEPAM 1 MG PO TABS
1.0000 mg | ORAL_TABLET | Freq: Two times a day (BID) | ORAL | Status: DC
  Administered 2020-05-22 – 2020-05-25 (×7): 1 mg via ORAL
  Filled 2020-05-21 (×8): qty 1

## 2020-05-21 MED ORDER — ALLOPURINOL 300 MG PO TABS
300.0000 mg | ORAL_TABLET | Freq: Every day | ORAL | Status: DC
  Administered 2020-05-21 – 2020-05-24 (×4): 300 mg via ORAL
  Filled 2020-05-21 (×8): qty 1

## 2020-05-21 MED ORDER — ESCITALOPRAM OXALATE 10 MG PO TABS
20.0000 mg | ORAL_TABLET | Freq: Every day | ORAL | Status: DC
  Administered 2020-05-22 – 2020-05-25 (×4): 20 mg via ORAL
  Filled 2020-05-21 (×4): qty 2

## 2020-05-21 MED ORDER — MELATONIN 3 MG PO TABS
3.0000 mg | ORAL_TABLET | Freq: Every day | ORAL | Status: DC
  Administered 2020-05-22 – 2020-05-24 (×3): 3 mg via ORAL
  Filled 2020-05-21 (×3): qty 1

## 2020-05-21 MED ORDER — FUROSEMIDE 40 MG PO TABS
20.0000 mg | ORAL_TABLET | Freq: Every day | ORAL | Status: DC
  Administered 2020-05-22 – 2020-05-25 (×4): 20 mg via ORAL
  Filled 2020-05-21 (×4): qty 1

## 2020-05-21 MED ORDER — DIVALPROEX SODIUM ER 500 MG PO TB24
500.0000 mg | ORAL_TABLET | Freq: Two times a day (BID) | ORAL | Status: DC
  Administered 2020-05-22: 500 mg via ORAL
  Filled 2020-05-21 (×2): qty 1

## 2020-05-21 MED ORDER — SIMVASTATIN 10 MG PO TABS
40.0000 mg | ORAL_TABLET | Freq: Every day | ORAL | Status: DC
  Administered 2020-05-22 – 2020-05-24 (×3): 40 mg via ORAL
  Filled 2020-05-21 (×4): qty 4

## 2020-05-21 NOTE — ED Notes (Signed)
Medications refused remain crushed . Currently not wasted incase pt takes them.

## 2020-05-21 NOTE — ED Notes (Signed)
geodon has not helped pt. Pt urinated in floor. Pt increasingly agitated.  edp notified.
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
## 2020-05-21 NOTE — ED Notes (Signed)
Having difficulty getting rendering treatment.  Pt is pacing at time with flat affect.  No following commands at times.
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
## 2020-05-21 NOTE — ED Notes (Signed)
Attempted to ambulate client to bathroom to obtain urine sample. Patient was uncooperative and does not understand what is going on. When attempts made to assist with urinal, he would grab tech's hand and not allow for assistance. Will allow patient to calm down and attempt again later. When asked if he needed to go to bathroom he just stares straight ahead or begins to laugh. Difficult to redirect.
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
## 2020-05-21 NOTE — ED Notes (Signed)
Pt has been incont of urine.

## 2020-05-21 NOTE — ED Notes (Signed)
Pt refused to take ordered medications / pt increasingly agitated edp notified.

## 2020-05-21 NOTE — ED Notes (Signed)
Pt following commands.  Pt given a work apron.  Up in chair.

## 2020-05-21 NOTE — ED Provider Notes (Signed)
Blood pressure (!) 140/99, pulse 83, temperature 98.6 F (37 C), temperature source Oral, resp. rate 18, height 5\' 10"  (1.778 m), weight 106.1 kg, SpO2 100 %.  In short, Marvin [name] is a 69 y.o. male with a chief complaint of Medical Clearance .  Refer to the original H&P for additional details.  07:30 PM  Patient is becoming increasingly agitated.  He is not being specifically aggressive but is trying to leave his room and is not redirectable.  I ordered his home medications after review including some as needed oral medications which she is refusing.  Reviewed his EKG which shows normal QTC.  Will give IM Geodon with increasing agitation and concern for patient safety.   EKG Interpretation  Date/Time:  Monday May 21 2020 14:11:33 EDT Ventricular Rate:  86 PR Interval:    QRS Duration: 74 QT Interval:  350 QTC Calculation: 418 R Axis:   -25 Text Interpretation: Atrial fibrillation with premature ventricular or aberrantly conducted complexes Abnormal ECG Artifact No significant change since last tracing Confirmed by Fredia Sorrow [phone]) on 05/21/2020 2:37:40 PM           CRITICAL CARE Performed by: Margette Fast Total critical care time: 30 minutes Critical care time was exclusive of separately billable procedures and treating other patients. Critical care was necessary to treat or prevent imminent or life-threatening deterioration. Critical care was time spent personally by me on the following activities: development of treatment plan with patient and/or surrogate as well as nursing, discussions with consultants, evaluation of patient's response to treatment, examination of patient, obtaining history from patient or surrogate, ordering and performing treatments and interventions, ordering and review of laboratory studies, ordering and review of radiographic studies, pulse oximetry and re-evaluation of patient's condition.  Nanda Quinton, MD Emergency Medicine     Berniece Abid,  [facility], MD 05/22/20 [phone]

## 2020-05-22 MED ORDER — DIVALPROEX SODIUM 125 MG PO CSDR
500.0000 mg | DELAYED_RELEASE_CAPSULE | Freq: Two times a day (BID) | ORAL | Status: DC
  Administered 2020-05-22 – 2020-05-25 (×7): 500 mg via ORAL
  Filled 2020-05-22 (×14): qty 4

## 2020-05-22 MED FILL — Melatonin Tab 3 MG: ORAL | Qty: 1 | Status: AC

## 2020-05-22 NOTE — ED Notes (Signed)
Pt attempted to get out of his chair. This NT was told that the pt has a tendency to urinate in the floor. This NT got another NT to attempt to assist the pt to get up to use the restroom. The pt resisted. We will try again later. Pt is calm.

## 2020-05-22 NOTE — ED Notes (Signed)
Pts clothing changed and pt cleaned of urine.

## 2020-05-22 NOTE — BH Assessment (Signed)
[place] Assessment Progress Note  Per Charmaine Downs, NP, this voluntary pt requires psychiatric hospitalization at this time.  The following facilities have been contacted to seek placement for this pt, with results as noted:  Beds available, information sent, decision pending: [place]: Ronnette Juniper, [place] Coordinator [phone]

## 2020-05-22 NOTE — ED Notes (Signed)
This NT and another NT attempted to try to get the pt to use the restroom. Pt refused again. Will try again later. RN notified.
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
## 2020-05-23 NOTE — Progress Notes (Addendum)
11:30: CSW received call back from [place] at [facility] who reported that in order for this pt to return to their facility two provider notes would need to be completed stating that this patient has not been sexually or physically aggressive. Harriett Sine, NP and Dr. Sabra Heck both notified.    CSW spoke with Lenna Sciara at [facility] [phone]) who stated she would have to speak with her supervisor regarding this pt returning to their facility. CSW is awaiting a return call at this time.   Darletta Moll MSW, [place] Worker  [facility] Ph: [phone] Fax: [phone]

## 2020-05-23 NOTE — ED Notes (Signed)
Pt checked and is dry. Pt assisted to bed.

## 2020-05-23 NOTE — Progress Notes (Addendum)
Reassessment: Patient seen. Chart reviewed. Patient is a 69 year old male with history of early-onset dementia with behavioral disturbances. Per chart review, patient has multiple ED visits over the past year for aggressive and inappropriate sexual behaviors at his SNF.   Mr. Bastin seen sitting at the bedside. He appears sedated and falls asleep several times during assessment. He answers "yes" to his name but otherwise mostly nonverbal and does not participate in assessment. Recent history obtained from his sitter at the bedside. She reports patient has been calm and cooperative. He has been sleeping during the night but still dozes during the day. No agitated or aggressive behaviors. He has exposed himself at times in the ED but is easily redirected by staff to cover himself up. No suicidal or aggressive behaviors.   Per TTS assessment: Clinician reviewed note by Dr. Venita Sheffield.  Patient medically cleared. We will have behavioral health interviewed him. Because it was insisted upon by [facility]. Feel patient's cognitive baseline seems to be very similar to the previous presentation in the middle of May. His Depakote levels are still a little low ammonia level up a little bit. I do not think this has any significant presentation. From medical standpoint patient stable to go back to nurse facility.   Pt was seen by this clinician.  Pt did not participate and this was evidenced by his not responding to clinician.  Pt continued to watch television when NT redirected him to clinician.  Pt would look at clinician but did not respond. Clinician did call [facility] facility and spoke to patient's nurse, Glenard Haring.  She said that patient was sent to [facility] today because he was being sexually aggressive towards staff and residents.  She said he had exposed himself and masturbated publicly four times.   Disposition: Patient appears to be at his baseline and is showing no aggressive or suicidal  behaviors. He is psych cleared for discharge. CSW and EDP notified.

## 2020-05-23 NOTE — ED Notes (Signed)
Pt has ate 50% of his breakfast and drank some of his coffee and all of his milk. Pt has been siting in the recliner watching tv and napping.

## 2020-05-23 NOTE — ED Provider Notes (Signed)
This patient has not been agitated or had aggressive behaviors while in the ER - and has been calm and redirectable throughout his ER stay.  Dr. Steffanie Rainwater, Aaron Edelman, MD 05/23/20 [phone]

## 2020-05-23 NOTE — ED Notes (Signed)
Pt cleared by psych

## 2020-05-24 NOTE — ED Notes (Signed)
Patient resting room with no issues. Patient still non verbal with his needs.

## 2020-05-24 NOTE — Clinical Social Work Note (Signed)
Transition of Care [facility]) - Emergency Department Mini Assessment  Patient Details  Name: Marvin [name] MRN: [id number] Date of Birth: 17-Jun-1951  Transition of Care [facility] CM/SW Contact:    Sherie Don, LCSW Phone Number: 05/24/2020, 2:34 PM  Clinical Narrative: Patient is 69 year old male who presented to the ED for a psych evaluation per [name]'s request. Per psych NP's note on 05/23/20, patient is psych cleared to return to his facility. ED was informed the patient could not return to the facility without 2 physician's notes stating the patient is no longer physically or sexually aggressive. These two notes were entered on 05/23/20 at 11:31am and 05/24/20 at 4:50am.  CSW attempted to call [name] with admissions twice regarding the patient being psych cleared and left voicemails requesting call back to discuss patient's discharge. CSW was informed the Director of Nursing, [name], would call back CSW.  CSW received call from [name]. [name] stated [facility] will not take the patient back as the notes do not explicitly state the patient "isn't a danger to himself or others." [name] also stated they will not take back the patient because his note on 05/22/20 indicated the patient met inpatient criteria for geri-psych placement. However, per NP's psychiatry note on 05/23/20 it states, "Patient appears to be at his baseline and is showing no aggressive or suicidal behaviors. He is psych cleared for discharge." CSW informed [name] that CSW's will need to notify her supervisor as the ED has completed what was initially requested by [name]. Supervisor updated.  ED Mini Assessment: What brought you to the Emergency Department? : Psych evaluation per [name]'s request Barriers to Discharge: ED Facility/Family Refusing to Allow Patient to Return Barrier interventions: [name] assessment; medication adjustments Means of departure: Ambulance Interventions which prevented an admission or  readmission: Transportation Screening, [place] return to placement after psych assessment  Patient Contact and Communications Key Contact 1: Teacher, music of Nursing for [name]) Spoke with: Priscille Loveless Date: 05/24/20, Contact time: 1338 Contact Phone Number: [phone] Call outcome: Director of Nursing is refusing to allow the patient to return  Admission diagnosis:  AMS Patient Active Problem List   Diagnosis Date Noted  . Gout 02/01/2020  . Need for immunization against influenza 02/01/2020  . Hypoglycemia 07/24/2019  . Cerebral atrophy ([name]) 07/24/2019  . Venous insufficiency of both lower extremities 07/24/2019  . 2+ pitting edema BLE 07/24/2019  . Hospital discharge follow-up 06/26/2019  . Dementia ([name]) 06/26/2019  . Dementia with behavioral problem ([name]) 06/26/2019  . Constipation 06/04/2019  . Elevated ALT measurement 06/03/2019  . Vitamin B12 deficiency 05/29/2019  . Vitamin D deficiency 05/24/2019  . Impaired fasting glucose 05/23/2019  . Prolonged QT interval 05/23/2019  . Prediabetes 04/13/2018  . Hyperuricemia 04/13/2018  . Obesity (BMI 30.0-34.9) 06/26/2013  . Hypertension   . Hyperlipidemia   . Premature atrial contractions   . IGT (impaired glucose tolerance) 02/07/2010   PCP:  Fayrene Helper, MD Pharmacy:   [facility] [address], [place] - [place] [address] [place] [address] Phone: [phone] Fax: [phone]  [place] [address], [place] - [place] [address] [place] [address] [place] [address] Phone: [phone] Fax: [phone]  [place], [place] - [address] [place] [address] Phone: [phone] Fax: [phone]

## 2020-05-24 NOTE — ED Provider Notes (Signed)
Nurses have not reported any aggressive behavior since they arrived at 7 PM tonight.  Patient has been sitting in his room and sleeping in a recliner.  His sitters report he has not been verbal, he has had some urinary incontinence.  When I observe the patient he is sleeping.   Marvin Porter, MD 05/24/20 [phone]

## 2020-05-24 NOTE — ED Notes (Signed)
Report given to AJ.

## 2020-05-24 NOTE — ED Notes (Signed)
Patient was checked for incontinence at 0630 with RN Effie Shy NT and myself. Patient was not wet at this time. Patient placed back in chair and covered up

## 2020-05-24 NOTE — ED Notes (Signed)
Patient is non verbal to staff. Unable to tell staff when incontinent of bladder and bowel. Patient asleep in geri chair in room. RN Derrell Lolling NT, [place] NT and myself in room to check patient for incontinence, patient was wet. Patient was cleaned up and changed into dry scrubs. Patient placed back in geri chair with feet elevated and given warm blanket. Patient went back to sleep.

## 2020-05-25 DIAGNOSIS — R41841 Cognitive communication deficit: Secondary | ICD-10-CM | POA: Diagnosis not present

## 2020-05-25 DIAGNOSIS — R0902 Hypoxemia: Secondary | ICD-10-CM | POA: Diagnosis not present

## 2020-05-25 DIAGNOSIS — Z743 Need for continuous supervision: Secondary | ICD-10-CM | POA: Diagnosis not present

## 2020-05-25 DIAGNOSIS — R69 Illness, unspecified: Secondary | ICD-10-CM | POA: Diagnosis not present

## 2020-05-25 NOTE — Clinical Social Work Note (Signed)
CSW received return call from [place] with [facility]. Per Lenna Sciara, the facility is agreeing to accept the patient back. CSW spoke with RN, Magda Paganini, to inform her the patient can return via EMS to [facility]. Number to call is [phone].  Tobi Bastos, LCSW Transitions of Care Clinical Social Worker [facility] Ph: [phone]

## 2020-05-25 NOTE — Clinical Social Work Note (Signed)
CSW called [facility] and spoke with [place] in admissions. CSW explained she was calling to follow up with the patient not being taken back by [facility] yesterday and that additional documentation was added by EDP. Melissa stated she would follow up with [name], the director of nursing. CSW informed Lenna Sciara that the state will be called if the facility continues to refuse to take back the patient given that all the requested evaluations and documentation has been completed. Melissa confirmed understanding.  Tobi Bastos, LCSW Transitions of Care Clinical Social Worker [facility] Ph: [phone]

## 2020-05-25 NOTE — ED Notes (Signed)
Per Andrew Au accepted pt at [facility].  Call ems and arrange transport.

## 2020-05-25 NOTE — ED Notes (Signed)
Pt sitting up in chair asleep.  Telemonitoring reinitiated.

## 2020-05-25 NOTE — ED Notes (Signed)
Pt's diaper wet.  Cleaned pt, pt took medication and ate all of breakfast tray.

## 2020-05-25 NOTE — ED Provider Notes (Signed)
The date is May 25, 2020, I have reevaluated this patient who has been very calm, interactive as needed and without any threats or danger to himself or others.  This patient isn't a danger to himself or others. The patient no longer meets inpatient criteria but is stable for discharge back to his facility He has not been aggressive or inappropriately sexual to staff or others around him.  He has not had any violent, homicidal or suicidal behavior.  He is cleared psychiatrically and medically for discharge     Noemi Chapel, MD 05/25/20 [phone]

## 2020-05-25 NOTE — Discharge Instructions (Signed)
Please see your doctor in the next week as needed, emergency department for worsening symptoms.

## 2020-05-30 DIAGNOSIS — F419 Anxiety disorder, unspecified: Secondary | ICD-10-CM | POA: Diagnosis not present

## 2020-05-30 DIAGNOSIS — F339 Major depressive disorder, recurrent, unspecified: Secondary | ICD-10-CM | POA: Diagnosis not present

## 2020-05-30 DIAGNOSIS — R69 Illness, unspecified: Secondary | ICD-10-CM | POA: Diagnosis not present

## 2020-05-30 DIAGNOSIS — F259 Schizoaffective disorder, unspecified: Secondary | ICD-10-CM | POA: Diagnosis not present

## 2020-06-05 DIAGNOSIS — R55 Syncope and collapse: Secondary | ICD-10-CM | POA: Diagnosis not present

## 2020-06-14 DEATH — deceased

## 2020-07-10 ENCOUNTER — Ambulatory Visit: Payer: Medicare HMO | Admitting: Adult Health

## 2020-08-04 IMAGING — CR PORTABLE CHEST - 1 VIEW
1 series · 1 of 1 positions shown · non-contrast
Comparison: May 18, 2019

CLINICAL DATA: Hypoglycemia.

EXAM:
PORTABLE CHEST 1 VIEW

[portable]
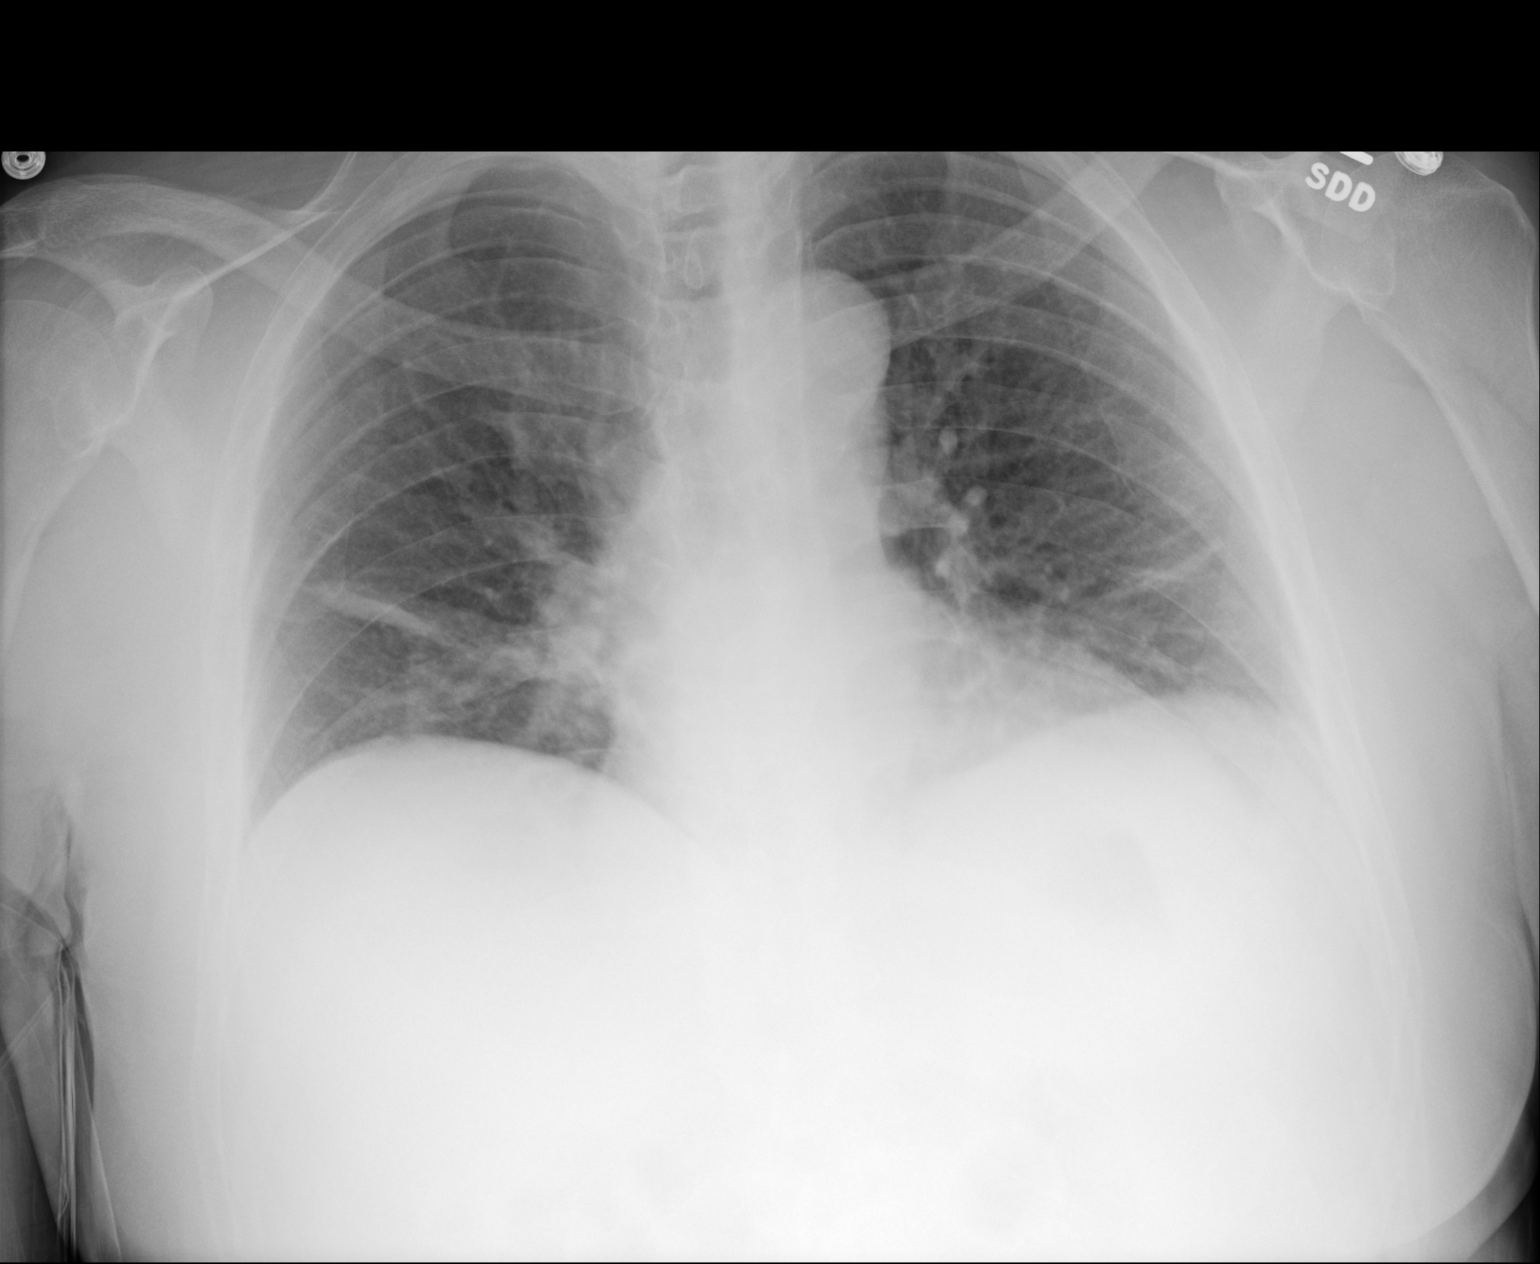

[1 of 1 positions shown; findings below may reference images not displayed]

FINDINGS: Bibasilar opacities are platelike and favored represent atelectasis.
Low lung volumes are noted. No pneumothorax. The cardiomediastinal
silhouette is normal. No other abnormalities.
IMPRESSION: Bibasilar opacities favored represent atelectasis. No other
abnormalities.

## 2020-08-04 IMAGING — CT CT HEAD WITHOUT CONTRAST
3 series · 15 of 47 positions shown, 18 images · non-contrast
Comparison: No priors.

CLINICAL DATA: 68-year-old male with history of altered mental
status for the past 3 days.

EXAM:
CT HEAD WITHOUT CONTRAST
TECHNIQUE: Contiguous axial images were obtained from the base of the skull
through the vertex without intravenous contrast.

[Series 2: head w o · axial · 0.46mm/px · z∈[-29,+101]mm · 9 of 32 slices shown, 12 images]
[im 3/32  brain]
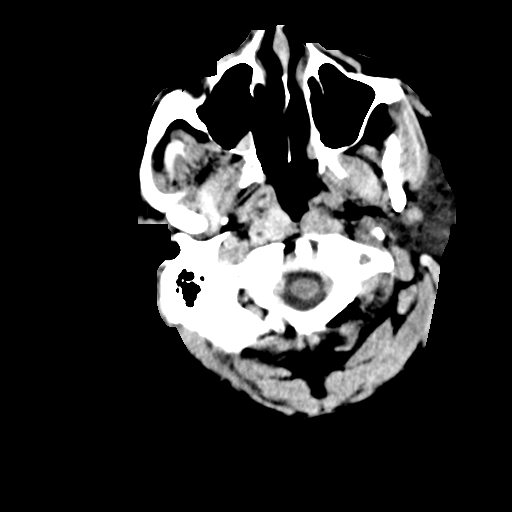
[im 3/32  bone]
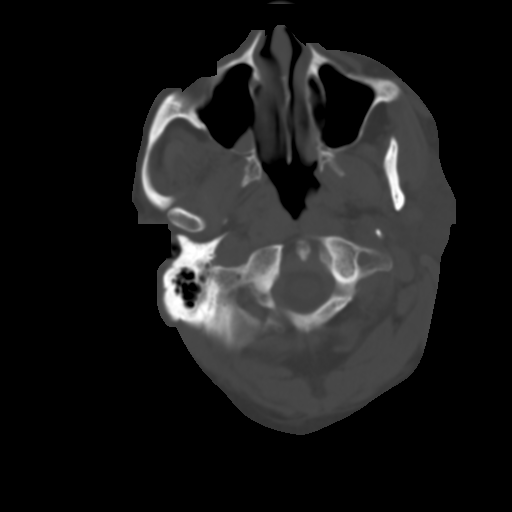
[im 6/32  brain]
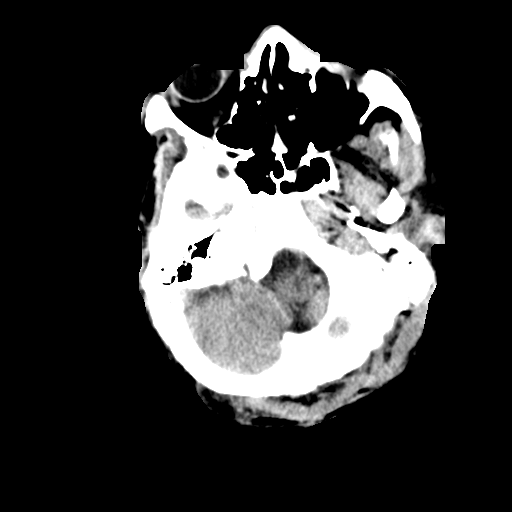
[im 9/32  brain]
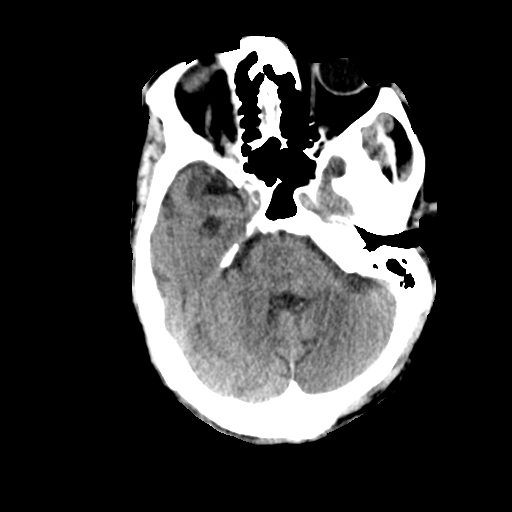
[im 12/32  brain]
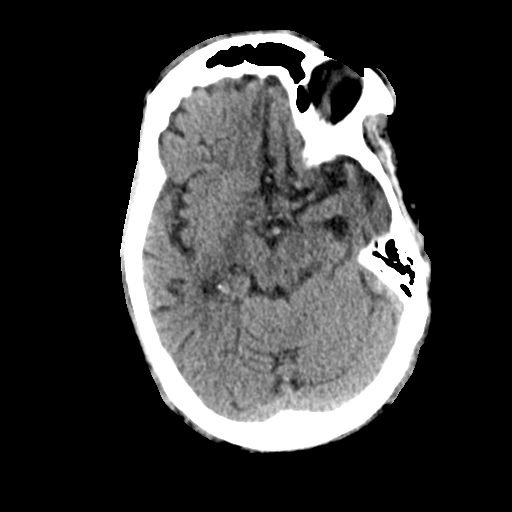
[im 17/32  brain]
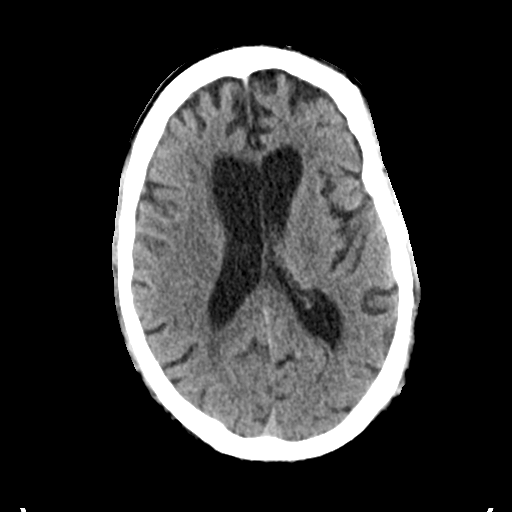
[im 17/32  bone]
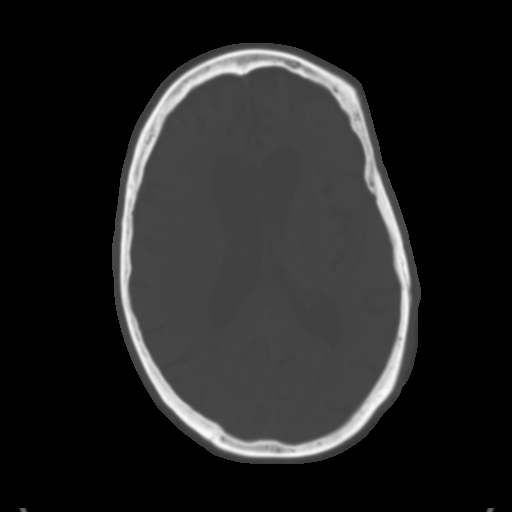
[im 20/32  brain]
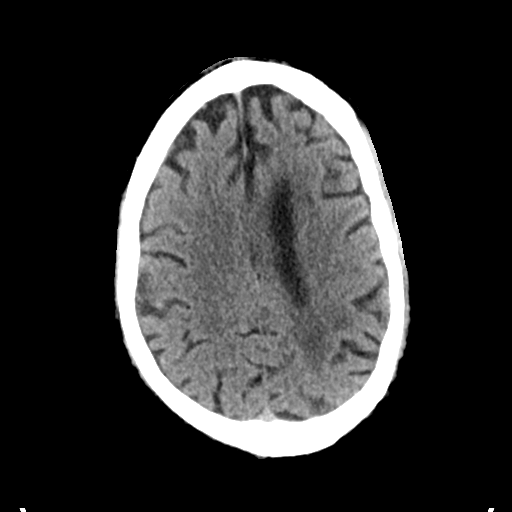
[im 23/32  brain]
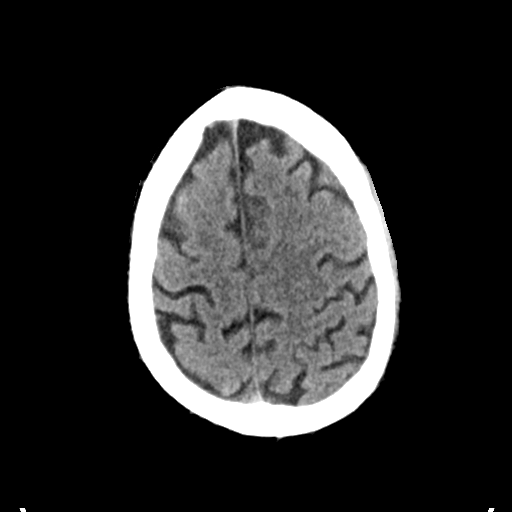
[im 26/32  brain]
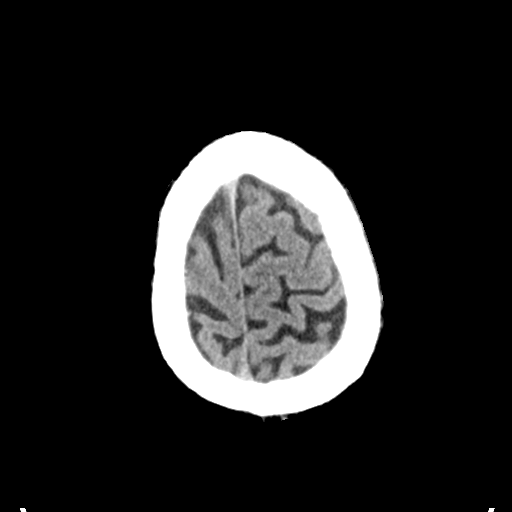
[im 29/32  brain]
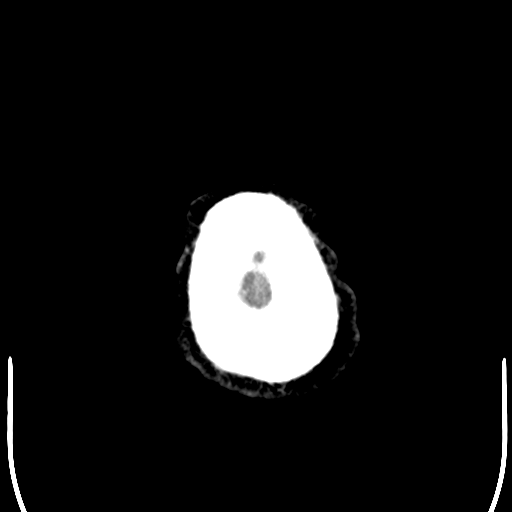
[im 29/32  bone]
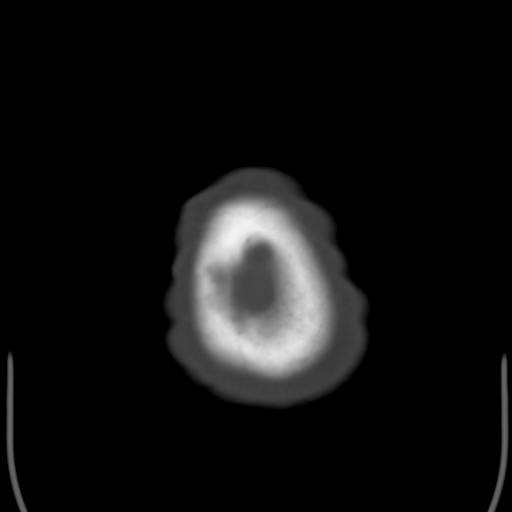

[Series 4: coronal soft · coronal · 0.32mm/px · 3 of 71 slices shown]
[im 24/71  brain]
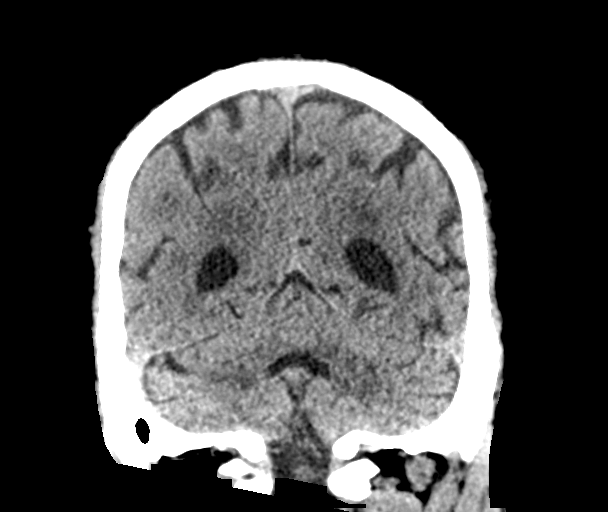
[im 32/71  brain]
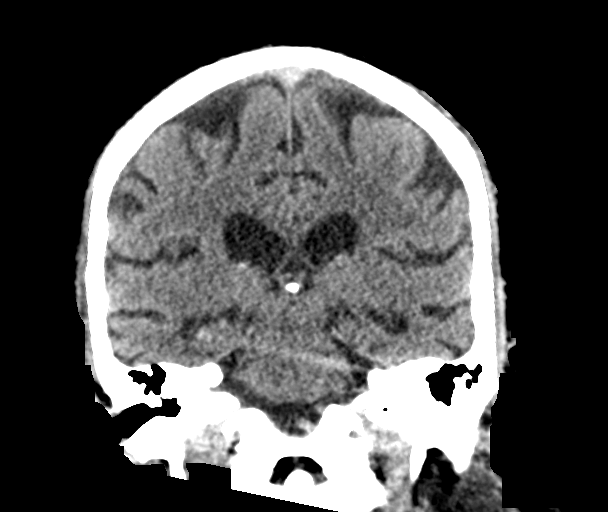
[im 39/71  brain]
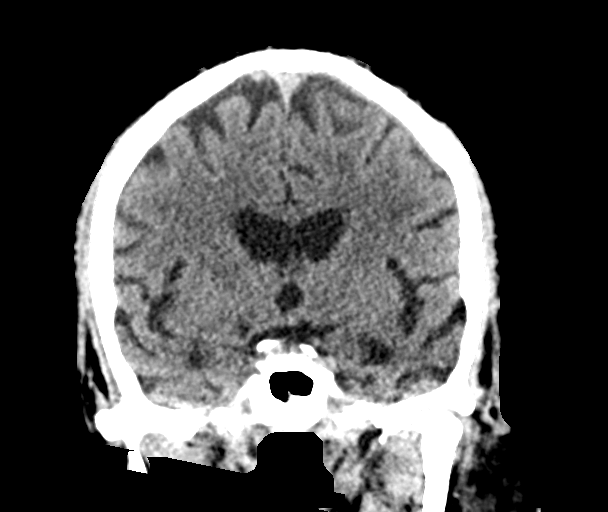

[Series 5: sagittal soft · sagittal · 0.32mm/px · 3 of 55 slices shown]
[im 23/55  brain]
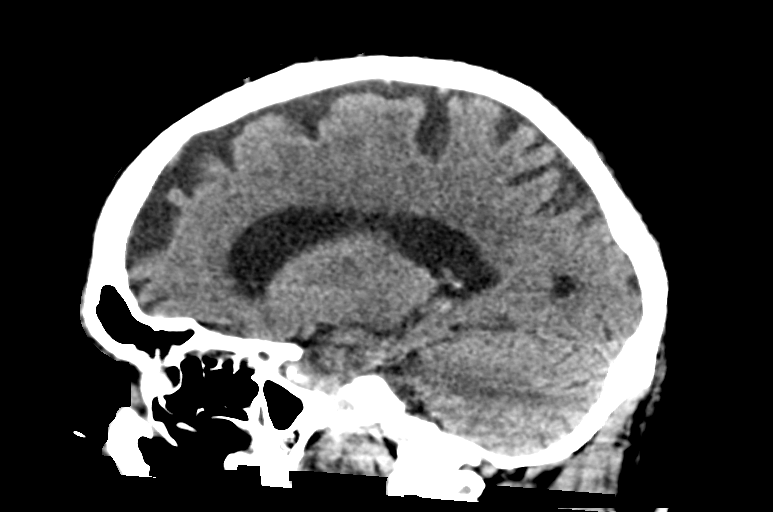
[im 28/55  brain]
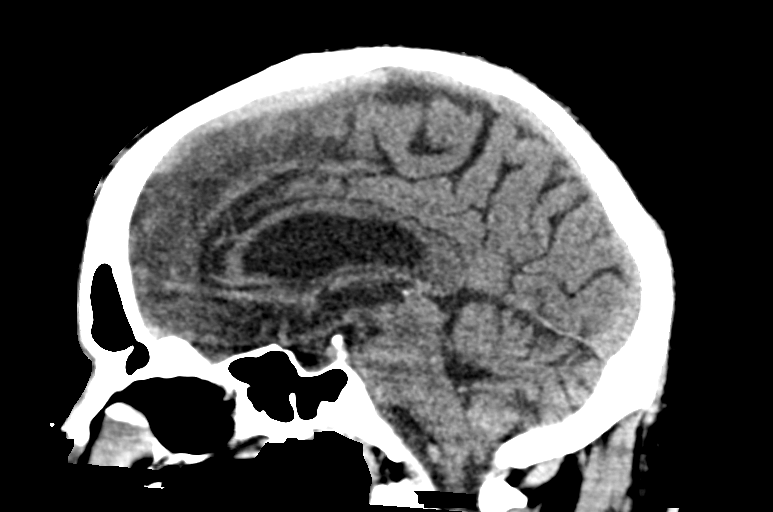
[im 33/55  brain]
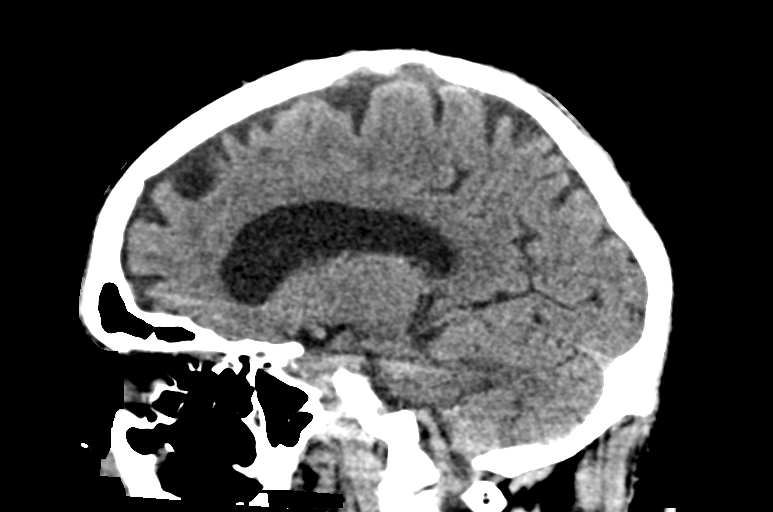

[15 of 47 positions shown; findings below may reference images not displayed]

FINDINGS: Brain: Mild cerebral atrophy. Patchy and confluent areas of
decreased attenuation are noted throughout the deep and
periventricular white matter of the cerebral hemispheres
bilaterally, compatible with chronic microvascular ischemic disease.
No evidence of acute infarction, hemorrhage, hydrocephalus,
extra-axial collection or mass lesion/mass effect.

Vascular: No hyperdense vessel or unexpected calcification.

Skull: Normal. Negative for fracture or focal lesion.

Sinuses/Orbits: No acute finding.

Other: None.
IMPRESSION: 1. No acute intracranial abnormalities.
2. Mild cerebral atrophy with chronic microvascular ischemic changes
in the cerebral white matter, as above.

## 2020-09-18 ENCOUNTER — Ambulatory Visit: Payer: Medicare HMO | Admitting: Adult Health
